# Patient Record
Sex: Female | Born: 1945 | Race: White | Hispanic: No | Marital: Married | Smoking: Former smoker
Health system: Southern US, Community
[De-identification: ages and names within clinical notes are randomized; demographics above are authoritative.]

## PROBLEM LIST (undated history)

## (undated) DIAGNOSIS — J309 Allergic rhinitis, unspecified: Secondary | ICD-10-CM

## (undated) DIAGNOSIS — G4733 Obstructive sleep apnea (adult) (pediatric): Secondary | ICD-10-CM

## (undated) DIAGNOSIS — Z9989 Dependence on other enabling machines and devices: Secondary | ICD-10-CM

## (undated) DIAGNOSIS — K219 Gastro-esophageal reflux disease without esophagitis: Secondary | ICD-10-CM

## (undated) DIAGNOSIS — E785 Hyperlipidemia, unspecified: Secondary | ICD-10-CM

## (undated) DIAGNOSIS — M1990 Unspecified osteoarthritis, unspecified site: Secondary | ICD-10-CM

## (undated) DIAGNOSIS — E119 Type 2 diabetes mellitus without complications: Secondary | ICD-10-CM

## (undated) DIAGNOSIS — M797 Fibromyalgia: Secondary | ICD-10-CM

## (undated) DIAGNOSIS — I2510 Atherosclerotic heart disease of native coronary artery without angina pectoris: Secondary | ICD-10-CM

## (undated) HISTORY — DX: Fibromyalgia: M79.7

## (undated) HISTORY — PX: VESICOVAGINAL FISTULA CLOSURE W/ TAH: SUR271

## (undated) HISTORY — DX: Atherosclerotic heart disease of native coronary artery without angina pectoris: I25.10

## (undated) HISTORY — DX: Allergic rhinitis, unspecified: J30.9

## (undated) HISTORY — DX: Unspecified osteoarthritis, unspecified site: M19.90

## (undated) HISTORY — DX: Type 2 diabetes mellitus without complications: E11.9

## (undated) HISTORY — DX: Gastro-esophageal reflux disease without esophagitis: K21.9

## (undated) HISTORY — PX: EYE SURGERY: SHX253

## (undated) HISTORY — DX: Obstructive sleep apnea (adult) (pediatric): G47.33

## (undated) HISTORY — DX: Hyperlipidemia, unspecified: E78.5

## (undated) HISTORY — PX: TONSILLECTOMY: SUR1361

## (undated) HISTORY — DX: Dependence on other enabling machines and devices: Z99.89

---

## 1998-10-30 ENCOUNTER — Ambulatory Visit (HOSPITAL_COMMUNITY): Admission: RE | Admit: 1998-10-30 | Discharge: 1998-10-30 | Payer: Self-pay | Admitting: Neurosurgery

## 1999-04-07 ENCOUNTER — Encounter: Payer: Self-pay | Admitting: Internal Medicine

## 1999-04-07 ENCOUNTER — Ambulatory Visit: Admission: RE | Admit: 1999-04-07 | Discharge: 1999-04-07 | Payer: Self-pay | Admitting: Internal Medicine

## 2000-04-23 ENCOUNTER — Encounter: Payer: Self-pay | Admitting: *Deleted

## 2000-04-23 ENCOUNTER — Encounter: Admission: RE | Admit: 2000-04-23 | Discharge: 2000-04-23 | Payer: Self-pay | Admitting: *Deleted

## 2003-08-20 ENCOUNTER — Other Ambulatory Visit: Payer: Self-pay

## 2003-11-01 ENCOUNTER — Other Ambulatory Visit: Payer: Self-pay

## 2005-03-25 ENCOUNTER — Emergency Department: Payer: Self-pay | Admitting: Emergency Medicine

## 2005-03-25 ENCOUNTER — Other Ambulatory Visit: Payer: Self-pay

## 2005-03-30 ENCOUNTER — Ambulatory Visit: Payer: Self-pay | Admitting: Emergency Medicine

## 2005-10-11 ENCOUNTER — Emergency Department: Payer: Self-pay | Admitting: Unknown Physician Specialty

## 2005-10-12 ENCOUNTER — Emergency Department: Payer: Self-pay | Admitting: Emergency Medicine

## 2005-10-14 ENCOUNTER — Inpatient Hospital Stay: Payer: Self-pay | Admitting: Infectious Diseases

## 2005-10-14 ENCOUNTER — Other Ambulatory Visit: Payer: Self-pay

## 2005-11-05 ENCOUNTER — Ambulatory Visit: Payer: Self-pay | Admitting: Infectious Diseases

## 2005-11-09 ENCOUNTER — Ambulatory Visit: Payer: Self-pay | Admitting: Infectious Diseases

## 2005-12-10 ENCOUNTER — Ambulatory Visit: Payer: Self-pay | Admitting: Infectious Diseases

## 2006-02-04 ENCOUNTER — Other Ambulatory Visit: Payer: Self-pay

## 2006-02-04 ENCOUNTER — Inpatient Hospital Stay: Payer: Self-pay | Admitting: Specialist

## 2006-02-05 ENCOUNTER — Other Ambulatory Visit: Payer: Self-pay

## 2006-07-08 ENCOUNTER — Ambulatory Visit: Payer: Self-pay | Admitting: Specialist

## 2006-09-21 ENCOUNTER — Ambulatory Visit: Payer: Self-pay | Admitting: Internal Medicine

## 2006-09-28 ENCOUNTER — Ambulatory Visit (HOSPITAL_BASED_OUTPATIENT_CLINIC_OR_DEPARTMENT_OTHER): Admission: RE | Admit: 2006-09-28 | Discharge: 2006-09-28 | Payer: Self-pay | Admitting: Internal Medicine

## 2006-10-03 ENCOUNTER — Ambulatory Visit: Payer: Self-pay | Admitting: Internal Medicine

## 2006-10-14 ENCOUNTER — Ambulatory Visit: Payer: Self-pay | Admitting: Internal Medicine

## 2006-12-16 ENCOUNTER — Ambulatory Visit: Payer: Self-pay | Admitting: Internal Medicine

## 2006-12-29 ENCOUNTER — Ambulatory Visit: Payer: Self-pay | Admitting: Internal Medicine

## 2006-12-30 ENCOUNTER — Ambulatory Visit: Payer: Self-pay | Admitting: Internal Medicine

## 2007-02-09 ENCOUNTER — Ambulatory Visit: Payer: Self-pay | Admitting: Unknown Physician Specialty

## 2007-02-25 ENCOUNTER — Ambulatory Visit: Payer: Self-pay | Admitting: Internal Medicine

## 2007-02-25 DIAGNOSIS — Z860100 Personal history of colon polyps, unspecified: Secondary | ICD-10-CM | POA: Insufficient documentation

## 2007-02-25 DIAGNOSIS — IMO0001 Reserved for inherently not codable concepts without codable children: Secondary | ICD-10-CM

## 2007-02-25 DIAGNOSIS — K219 Gastro-esophageal reflux disease without esophagitis: Secondary | ICD-10-CM | POA: Insufficient documentation

## 2007-02-25 DIAGNOSIS — M810 Age-related osteoporosis without current pathological fracture: Secondary | ICD-10-CM | POA: Insufficient documentation

## 2007-02-25 DIAGNOSIS — J309 Allergic rhinitis, unspecified: Secondary | ICD-10-CM | POA: Insufficient documentation

## 2007-02-25 DIAGNOSIS — M353 Polymyalgia rheumatica: Secondary | ICD-10-CM | POA: Insufficient documentation

## 2007-02-25 DIAGNOSIS — E119 Type 2 diabetes mellitus without complications: Secondary | ICD-10-CM

## 2007-02-25 DIAGNOSIS — N3000 Acute cystitis without hematuria: Secondary | ICD-10-CM

## 2007-02-25 DIAGNOSIS — J45909 Unspecified asthma, uncomplicated: Secondary | ICD-10-CM | POA: Insufficient documentation

## 2007-02-25 DIAGNOSIS — M1990 Unspecified osteoarthritis, unspecified site: Secondary | ICD-10-CM | POA: Insufficient documentation

## 2007-02-25 DIAGNOSIS — E785 Hyperlipidemia, unspecified: Secondary | ICD-10-CM

## 2007-02-25 LAB — CONVERTED CEMR LAB
Bilirubin Urine: NEGATIVE
Blood in Urine, dipstick: NEGATIVE
Glucose, Urine, Semiquant: NEGATIVE
Ketones, urine, test strip: NEGATIVE
Nitrite: NEGATIVE
Specific Gravity, Urine: >=1.03
Urobilinogen, UA: 0.2
pH: 5

## 2007-03-02 ENCOUNTER — Telehealth (INDEPENDENT_AMBULATORY_CARE_PROVIDER_SITE_OTHER): Payer: Self-pay | Admitting: *Deleted

## 2007-03-14 ENCOUNTER — Telehealth (INDEPENDENT_AMBULATORY_CARE_PROVIDER_SITE_OTHER): Payer: Self-pay | Admitting: *Deleted

## 2007-03-15 ENCOUNTER — Telehealth (INDEPENDENT_AMBULATORY_CARE_PROVIDER_SITE_OTHER): Payer: Self-pay | Admitting: *Deleted

## 2007-03-21 ENCOUNTER — Encounter: Payer: Self-pay | Admitting: Internal Medicine

## 2007-03-23 DIAGNOSIS — G4733 Obstructive sleep apnea (adult) (pediatric): Secondary | ICD-10-CM

## 2007-04-08 ENCOUNTER — Telehealth (INDEPENDENT_AMBULATORY_CARE_PROVIDER_SITE_OTHER): Payer: Self-pay | Admitting: *Deleted

## 2007-04-13 ENCOUNTER — Encounter: Payer: Self-pay | Admitting: Internal Medicine

## 2007-04-15 ENCOUNTER — Encounter: Payer: Self-pay | Admitting: Internal Medicine

## 2007-04-15 ENCOUNTER — Telehealth (INDEPENDENT_AMBULATORY_CARE_PROVIDER_SITE_OTHER): Payer: Self-pay | Admitting: *Deleted

## 2007-04-26 ENCOUNTER — Encounter: Payer: Self-pay | Admitting: Internal Medicine

## 2007-05-09 ENCOUNTER — Ambulatory Visit: Payer: Self-pay | Admitting: Internal Medicine

## 2007-06-02 ENCOUNTER — Ambulatory Visit: Payer: Self-pay | Admitting: Internal Medicine

## 2007-06-02 DIAGNOSIS — I2510 Atherosclerotic heart disease of native coronary artery without angina pectoris: Secondary | ICD-10-CM | POA: Insufficient documentation

## 2007-06-06 LAB — CONVERTED CEMR LAB
ALT: 21 units/L (ref 0–35)
AST: 15 units/L (ref 0–37)
Albumin: 3.7 g/dL (ref 3.5–5.2)
Alkaline Phosphatase: 74 units/L (ref 39–117)
BUN: 14 mg/dL (ref 6–23)
Basophils Absolute: 0.2 10*3/uL — ABNORMAL HIGH (ref 0.0–0.1)
Basophils Relative: 1.6 % — ABNORMAL HIGH (ref 0.0–1.0)
Bilirubin, Direct: 0.1 mg/dL (ref 0.0–0.3)
CO2: 34 meq/L — ABNORMAL HIGH (ref 19–32)
Calcium: 9.2 mg/dL (ref 8.4–10.5)
Chloride: 103 meq/L (ref 96–112)
Cholesterol: 281 mg/dL (ref 0–200)
Creatinine, Ser: 0.6 mg/dL (ref 0.4–1.2)
Direct LDL: 206.2 mg/dL
Eosinophils Absolute: 0.1 10*3/uL (ref 0.0–0.7)
Eosinophils Relative: 1.1 % (ref 0.0–5.0)
GFR calc Af Amer: 132 mL/min
GFR calc non Af Amer: 109 mL/min
Glucose, Bld: 119 mg/dL — ABNORMAL HIGH (ref 70–99)
HCT: 36.7 % (ref 36.0–46.0)
HDL: 39.2 mg/dL (ref >39.0)
Hemoglobin: 12.1 g/dL (ref 12.0–15.0)
Hgb A1c MFr Bld: 7 % — ABNORMAL HIGH (ref 4.6–6.0)
Lymphocytes Relative: 13.2 % (ref 12.0–46.0)
MCHC: 32.9 g/dL (ref 30.0–36.0)
MCV: 87.9 fL (ref 78.0–100.0)
Monocytes Absolute: 0.3 10*3/uL (ref 0.1–1.0)
Monocytes Relative: 2.8 % — ABNORMAL LOW (ref 3.0–12.0)
Neutro Abs: 9.9 10*3/uL — ABNORMAL HIGH (ref 1.4–7.7)
Neutrophils Relative %: 81.3 % — ABNORMAL HIGH (ref 43.0–77.0)
Phosphorus: 3.1 mg/dL (ref 2.3–4.6)
Platelets: 333 10*3/uL (ref 150–400)
Potassium: 4.1 meq/L (ref 3.5–5.1)
RBC: 4.17 M/uL (ref 3.87–5.11)
RDW: 14.4 % (ref 11.5–14.6)
Sodium: 142 meq/L (ref 135–145)
TSH: 0.81 microintl units/mL (ref 0.35–5.50)
Total Bilirubin: 0.6 mg/dL (ref 0.3–1.2)
Total CHOL/HDL Ratio: 7.2
Total Protein: 7.1 g/dL (ref 6.0–8.3)
Triglycerides: 261 mg/dL (ref 0–149)
VLDL: 52 mg/dL — ABNORMAL HIGH (ref 0–40)
WBC: 12.1 10*3/uL — ABNORMAL HIGH (ref 4.5–10.5)

## 2007-07-07 ENCOUNTER — Encounter: Payer: Self-pay | Admitting: Internal Medicine

## 2007-07-07 ENCOUNTER — Telehealth: Payer: Self-pay | Admitting: Internal Medicine

## 2007-08-12 ENCOUNTER — Emergency Department: Payer: Medicare Other | Admitting: Emergency Medicine

## 2007-08-17 ENCOUNTER — Telehealth: Payer: Self-pay | Admitting: Internal Medicine

## 2007-08-22 ENCOUNTER — Telehealth (INDEPENDENT_AMBULATORY_CARE_PROVIDER_SITE_OTHER): Payer: Self-pay | Admitting: *Deleted

## 2007-09-21 ENCOUNTER — Encounter: Payer: Self-pay | Admitting: Internal Medicine

## 2007-09-22 ENCOUNTER — Encounter: Payer: Self-pay | Admitting: Internal Medicine

## 2007-09-26 ENCOUNTER — Telehealth (INDEPENDENT_AMBULATORY_CARE_PROVIDER_SITE_OTHER): Payer: Self-pay | Admitting: *Deleted

## 2007-09-28 ENCOUNTER — Telehealth (INDEPENDENT_AMBULATORY_CARE_PROVIDER_SITE_OTHER): Payer: Self-pay | Admitting: *Deleted

## 2007-10-07 ENCOUNTER — Ambulatory Visit: Payer: Self-pay | Admitting: Internal Medicine

## 2007-10-07 DIAGNOSIS — L538 Other specified erythematous conditions: Secondary | ICD-10-CM | POA: Insufficient documentation

## 2007-10-07 DIAGNOSIS — N760 Acute vaginitis: Secondary | ICD-10-CM | POA: Insufficient documentation

## 2007-10-07 DIAGNOSIS — R609 Edema, unspecified: Secondary | ICD-10-CM

## 2007-10-21 ENCOUNTER — Ambulatory Visit: Payer: Self-pay | Admitting: Internal Medicine

## 2007-10-24 ENCOUNTER — Encounter: Payer: Self-pay | Admitting: Internal Medicine

## 2007-10-24 LAB — CONVERTED CEMR LAB
ALT: 28 units/L (ref 0–35)
AST: 25 units/L (ref 0–37)
Albumin: 3.9 g/dL (ref 3.5–5.2)
Alkaline Phosphatase: 69 units/L (ref 39–117)
BUN: 15 mg/dL (ref 6–23)
Basophils Absolute: 0 10*3/uL (ref 0.0–0.1)
Basophils Relative: 0.3 % (ref 0.0–3.0)
Bilirubin, Direct: 0.1 mg/dL (ref 0.0–0.3)
CO2: 33 meq/L — ABNORMAL HIGH (ref 19–32)
Calcium: 8.9 mg/dL (ref 8.4–10.5)
Chloride: 97 meq/L (ref 96–112)
Creatinine, Ser: 0.7 mg/dL (ref 0.4–1.2)
Eosinophils Absolute: 0.1 10*3/uL (ref 0.0–0.7)
Eosinophils Relative: 1 % (ref 0.0–5.0)
GFR calc Af Amer: 110 mL/min
GFR calc non Af Amer: 91 mL/min
Glucose, Bld: 106 mg/dL — ABNORMAL HIGH (ref 70–99)
HCT: 34.8 % — ABNORMAL LOW (ref 36.0–46.0)
Hemoglobin: 11.8 g/dL — ABNORMAL LOW (ref 12.0–15.0)
Hgb A1c MFr Bld: 6.5 % — ABNORMAL HIGH (ref 4.6–6.0)
Lymphocytes Relative: 10.4 % — ABNORMAL LOW (ref 12.0–46.0)
MCHC: 33.7 g/dL (ref 30.0–36.0)
MCV: 90.7 fL (ref 78.0–100.0)
Monocytes Absolute: 0.4 10*3/uL (ref 0.1–1.0)
Monocytes Relative: 3.6 % (ref 3.0–12.0)
Neutro Abs: 9.6 10*3/uL — ABNORMAL HIGH (ref 1.4–7.7)
Neutrophils Relative %: 84.7 % — ABNORMAL HIGH (ref 43.0–77.0)
Phosphorus: 3.5 mg/dL (ref 2.3–4.6)
Platelets: 308 10*3/uL (ref 150–400)
Potassium: 4.1 meq/L (ref 3.5–5.1)
RBC: 3.84 M/uL — ABNORMAL LOW (ref 3.87–5.11)
RDW: 14.1 % (ref 11.5–14.6)
Sodium: 139 meq/L (ref 135–145)
TSH: 0.95 microintl units/mL (ref 0.35–5.50)
Total Bilirubin: 0.6 mg/dL (ref 0.3–1.2)
Total Protein: 7.2 g/dL (ref 6.0–8.3)
WBC: 11.3 10*3/uL — ABNORMAL HIGH (ref 4.5–10.5)

## 2007-11-01 ENCOUNTER — Telehealth (INDEPENDENT_AMBULATORY_CARE_PROVIDER_SITE_OTHER): Payer: Self-pay | Admitting: *Deleted

## 2007-11-08 ENCOUNTER — Telehealth: Payer: Self-pay | Admitting: Internal Medicine

## 2007-11-30 ENCOUNTER — Telehealth: Payer: Self-pay | Admitting: Internal Medicine

## 2007-12-28 ENCOUNTER — Telehealth: Payer: Self-pay | Admitting: Internal Medicine

## 2008-02-27 ENCOUNTER — Telehealth (INDEPENDENT_AMBULATORY_CARE_PROVIDER_SITE_OTHER): Payer: Self-pay | Admitting: *Deleted

## 2008-03-13 ENCOUNTER — Telehealth: Payer: Self-pay | Admitting: Internal Medicine

## 2008-03-22 ENCOUNTER — Encounter: Payer: Self-pay | Admitting: Internal Medicine

## 2008-03-23 ENCOUNTER — Encounter: Payer: Self-pay | Admitting: Internal Medicine

## 2008-04-05 ENCOUNTER — Ambulatory Visit: Payer: Self-pay | Admitting: Internal Medicine

## 2008-05-07 ENCOUNTER — Ambulatory Visit: Payer: Self-pay | Admitting: Internal Medicine

## 2008-05-14 ENCOUNTER — Encounter: Payer: Self-pay | Admitting: Internal Medicine

## 2008-05-22 ENCOUNTER — Ambulatory Visit: Payer: Self-pay | Admitting: Internal Medicine

## 2008-06-25 ENCOUNTER — Telehealth: Payer: Self-pay | Admitting: Internal Medicine

## 2008-06-28 ENCOUNTER — Telehealth: Payer: Self-pay | Admitting: Internal Medicine

## 2008-07-11 ENCOUNTER — Encounter: Payer: Self-pay | Admitting: Internal Medicine

## 2008-07-13 ENCOUNTER — Encounter: Payer: Self-pay | Admitting: Internal Medicine

## 2008-07-18 ENCOUNTER — Telehealth (INDEPENDENT_AMBULATORY_CARE_PROVIDER_SITE_OTHER): Payer: Self-pay | Admitting: *Deleted

## 2008-07-20 ENCOUNTER — Telehealth: Payer: Self-pay | Admitting: Internal Medicine

## 2008-08-01 ENCOUNTER — Telehealth (INDEPENDENT_AMBULATORY_CARE_PROVIDER_SITE_OTHER): Payer: Self-pay | Admitting: *Deleted

## 2008-08-07 ENCOUNTER — Ambulatory Visit: Payer: Self-pay | Admitting: Internal Medicine

## 2008-08-07 DIAGNOSIS — E678 Other specified hyperalimentation: Secondary | ICD-10-CM | POA: Insufficient documentation

## 2008-08-13 ENCOUNTER — Encounter: Payer: Self-pay | Admitting: Internal Medicine

## 2008-08-16 ENCOUNTER — Encounter: Payer: Self-pay | Admitting: Internal Medicine

## 2008-08-17 ENCOUNTER — Telehealth: Payer: Self-pay | Admitting: Family Medicine

## 2008-08-20 ENCOUNTER — Ambulatory Visit: Payer: Self-pay | Admitting: Internal Medicine

## 2008-08-20 DIAGNOSIS — R079 Chest pain, unspecified: Secondary | ICD-10-CM | POA: Insufficient documentation

## 2008-08-24 LAB — CONVERTED CEMR LAB
ALT: 28 units/L (ref 0–35)
Alkaline Phosphatase: 78 units/L (ref 39–117)
Basophils Relative: 0.7 % (ref 0.0–3.0)
Bilirubin, Direct: 0.1 mg/dL (ref 0.0–0.3)
Calcium: 9.5 mg/dL (ref 8.4–10.5)
Chloride: 99 meq/L (ref 96–112)
Eosinophils Absolute: 0 10*3/uL (ref 0.0–0.7)
Glucose, Bld: 215 mg/dL — ABNORMAL HIGH (ref 70–99)
HCT: 38.1 % (ref 36.0–46.0)
Hemoglobin: 12.8 g/dL (ref 12.0–15.0)
Lymphocytes Relative: 5.8 % — ABNORMAL LOW (ref 12.0–46.0)
Lymphs Abs: 0.7 10*3/uL (ref 0.7–4.0)
MCHC: 33.6 g/dL (ref 30.0–36.0)
Monocytes Relative: 4.5 % (ref 3.0–12.0)
Neutro Abs: 11 10*3/uL — ABNORMAL HIGH (ref 1.4–7.7)
Potassium: 4.5 meq/L (ref 3.5–5.1)
RBC: 3.97 M/uL (ref 3.87–5.11)
Sed Rate: 32 mm/hr — ABNORMAL HIGH (ref 0–22)
Sodium: 138 meq/L (ref 135–145)
Total Protein: 6.8 g/dL (ref 6.0–8.3)

## 2008-08-30 ENCOUNTER — Encounter: Payer: Self-pay | Admitting: Internal Medicine

## 2008-09-01 ENCOUNTER — Encounter: Payer: Self-pay | Admitting: Internal Medicine

## 2008-09-01 ENCOUNTER — Emergency Department: Payer: Medicare Other | Admitting: Emergency Medicine

## 2008-09-02 ENCOUNTER — Telehealth: Payer: Self-pay | Admitting: Internal Medicine

## 2008-09-04 ENCOUNTER — Telehealth: Payer: Self-pay | Admitting: Internal Medicine

## 2008-09-04 ENCOUNTER — Encounter: Payer: Self-pay | Admitting: Internal Medicine

## 2008-09-05 ENCOUNTER — Telehealth: Payer: Self-pay | Admitting: Internal Medicine

## 2008-09-06 ENCOUNTER — Encounter: Payer: Self-pay | Admitting: Internal Medicine

## 2008-09-10 ENCOUNTER — Ambulatory Visit: Payer: Medicare Other | Admitting: Unknown Physician Specialty

## 2008-09-10 ENCOUNTER — Encounter: Payer: Self-pay | Admitting: Internal Medicine

## 2008-09-11 ENCOUNTER — Ambulatory Visit: Payer: Medicare Other | Admitting: Unknown Physician Specialty

## 2008-09-20 ENCOUNTER — Encounter: Payer: Self-pay | Admitting: Internal Medicine

## 2008-09-21 ENCOUNTER — Ambulatory Visit: Payer: Medicare Other | Admitting: Unknown Physician Specialty

## 2008-09-24 ENCOUNTER — Telehealth: Payer: Self-pay | Admitting: Internal Medicine

## 2008-10-24 ENCOUNTER — Telehealth (INDEPENDENT_AMBULATORY_CARE_PROVIDER_SITE_OTHER): Payer: Self-pay | Admitting: *Deleted

## 2008-10-27 ENCOUNTER — Encounter: Payer: Self-pay | Admitting: Internal Medicine

## 2008-10-28 ENCOUNTER — Inpatient Hospital Stay: Payer: Medicare Other | Admitting: Internal Medicine

## 2008-11-01 ENCOUNTER — Encounter: Payer: Self-pay | Admitting: Internal Medicine

## 2008-11-08 ENCOUNTER — Encounter: Payer: Medicare Other | Admitting: Internal Medicine

## 2008-11-08 ENCOUNTER — Encounter: Payer: Self-pay | Admitting: Internal Medicine

## 2008-11-09 ENCOUNTER — Encounter: Payer: Medicare Other | Admitting: Internal Medicine

## 2008-11-12 ENCOUNTER — Telehealth: Payer: Self-pay | Admitting: Internal Medicine

## 2008-11-14 ENCOUNTER — Telehealth: Payer: Self-pay | Admitting: Internal Medicine

## 2008-12-03 ENCOUNTER — Telehealth: Payer: Self-pay | Admitting: Internal Medicine

## 2008-12-10 ENCOUNTER — Ambulatory Visit: Payer: Self-pay | Admitting: Internal Medicine

## 2008-12-10 ENCOUNTER — Encounter: Payer: Medicare Other | Admitting: Internal Medicine

## 2008-12-11 ENCOUNTER — Ambulatory Visit: Payer: Self-pay | Admitting: Internal Medicine

## 2008-12-14 LAB — CONVERTED CEMR LAB
AST: 21 units/L (ref 0–37)
Albumin: 4.1 g/dL (ref 3.5–5.2)
BUN: 12 mg/dL (ref 6–23)
Basophils Absolute: 0 10*3/uL (ref 0.0–0.1)
CO2: 34 meq/L — ABNORMAL HIGH (ref 19–32)
Chloride: 94 meq/L — ABNORMAL LOW (ref 96–112)
Cholesterol: 272 mg/dL — ABNORMAL HIGH (ref 0–200)
Eosinophils Absolute: 0.1 10*3/uL (ref 0.0–0.7)
HCT: 38.2 % (ref 36.0–46.0)
Hemoglobin: 13.2 g/dL (ref 12.0–15.0)
Lymphs Abs: 1.6 10*3/uL (ref 0.7–4.0)
MCHC: 34.5 g/dL (ref 30.0–36.0)
MCV: 94.3 fL (ref 78.0–100.0)
Monocytes Absolute: 0.6 10*3/uL (ref 0.1–1.0)
Monocytes Relative: 5.4 % (ref 3.0–12.0)
Neutro Abs: 8.9 10*3/uL — ABNORMAL HIGH (ref 1.4–7.7)
RDW: 13.3 % (ref 11.5–14.6)
Total CHOL/HDL Ratio: 6
Triglycerides: 323 mg/dL — ABNORMAL HIGH (ref 0.0–149.0)

## 2008-12-20 ENCOUNTER — Encounter: Payer: Self-pay | Admitting: Internal Medicine

## 2008-12-21 ENCOUNTER — Encounter: Payer: Self-pay | Admitting: Internal Medicine

## 2008-12-21 ENCOUNTER — Telehealth: Payer: Self-pay | Admitting: Internal Medicine

## 2008-12-25 ENCOUNTER — Encounter: Payer: Self-pay | Admitting: Internal Medicine

## 2009-01-01 ENCOUNTER — Telehealth: Payer: Self-pay | Admitting: Family Medicine

## 2009-01-07 ENCOUNTER — Telehealth: Payer: Self-pay | Admitting: Internal Medicine

## 2009-01-08 ENCOUNTER — Encounter: Payer: Self-pay | Admitting: Internal Medicine

## 2009-01-09 ENCOUNTER — Encounter: Payer: Medicare Other | Admitting: Internal Medicine

## 2009-01-16 ENCOUNTER — Telehealth: Payer: Self-pay | Admitting: Internal Medicine

## 2009-01-30 ENCOUNTER — Encounter: Payer: Self-pay | Admitting: Internal Medicine

## 2009-02-09 ENCOUNTER — Encounter: Payer: Medicare Other | Admitting: Internal Medicine

## 2009-02-21 ENCOUNTER — Telehealth: Payer: Self-pay | Admitting: Internal Medicine

## 2009-02-22 ENCOUNTER — Encounter: Payer: Self-pay | Admitting: Internal Medicine

## 2009-02-27 ENCOUNTER — Encounter: Payer: Self-pay | Admitting: Internal Medicine

## 2009-03-12 ENCOUNTER — Encounter: Payer: Medicare Other | Admitting: Internal Medicine

## 2009-04-03 ENCOUNTER — Encounter: Payer: Self-pay | Admitting: Internal Medicine

## 2009-04-09 ENCOUNTER — Encounter: Payer: Medicare Other | Admitting: Internal Medicine

## 2009-04-10 ENCOUNTER — Encounter: Payer: Self-pay | Admitting: Internal Medicine

## 2009-04-19 ENCOUNTER — Encounter: Payer: Self-pay | Admitting: Internal Medicine

## 2009-05-10 ENCOUNTER — Encounter: Payer: Medicare Other | Admitting: Internal Medicine

## 2009-06-09 ENCOUNTER — Encounter: Payer: Medicare Other | Admitting: Internal Medicine

## 2009-06-24 ENCOUNTER — Ambulatory Visit: Payer: Medicare Other | Admitting: Internal Medicine

## 2009-07-10 ENCOUNTER — Encounter: Payer: Medicare Other | Admitting: Internal Medicine

## 2009-07-15 ENCOUNTER — Ambulatory Visit: Payer: Medicare Other | Admitting: Internal Medicine

## 2009-08-09 ENCOUNTER — Ambulatory Visit: Payer: Medicare Other | Admitting: Internal Medicine

## 2009-08-09 ENCOUNTER — Encounter: Payer: Medicare Other | Admitting: Internal Medicine

## 2009-08-27 ENCOUNTER — Encounter: Payer: Self-pay | Admitting: Internal Medicine

## 2009-09-09 ENCOUNTER — Encounter: Payer: Medicare Other | Admitting: Internal Medicine

## 2009-09-19 ENCOUNTER — Ambulatory Visit: Payer: Self-pay | Admitting: Internal Medicine

## 2009-09-19 DIAGNOSIS — J42 Unspecified chronic bronchitis: Secondary | ICD-10-CM | POA: Insufficient documentation

## 2009-10-10 ENCOUNTER — Encounter: Payer: Medicare Other | Admitting: Internal Medicine

## 2009-10-10 ENCOUNTER — Ambulatory Visit: Payer: Medicare Other | Admitting: Internal Medicine

## 2009-10-28 ENCOUNTER — Ambulatory Visit: Payer: Medicare Other | Admitting: Internal Medicine

## 2009-11-09 ENCOUNTER — Ambulatory Visit: Payer: Medicare Other | Admitting: Internal Medicine

## 2009-11-09 ENCOUNTER — Encounter: Payer: Medicare Other | Admitting: Internal Medicine

## 2009-11-11 ENCOUNTER — Telehealth (INDEPENDENT_AMBULATORY_CARE_PROVIDER_SITE_OTHER): Payer: Self-pay | Admitting: *Deleted

## 2009-11-13 ENCOUNTER — Telehealth (INDEPENDENT_AMBULATORY_CARE_PROVIDER_SITE_OTHER): Payer: Self-pay | Admitting: *Deleted

## 2009-11-20 ENCOUNTER — Encounter: Payer: Self-pay | Admitting: Internal Medicine

## 2009-12-10 ENCOUNTER — Encounter: Payer: Medicare Other | Admitting: Internal Medicine

## 2010-01-09 ENCOUNTER — Encounter: Payer: Medicare Other | Admitting: Internal Medicine

## 2010-01-29 ENCOUNTER — Telehealth: Payer: Self-pay | Admitting: Internal Medicine

## 2010-01-30 ENCOUNTER — Ambulatory Visit: Payer: Self-pay | Admitting: Internal Medicine

## 2010-01-30 ENCOUNTER — Encounter: Payer: Self-pay | Admitting: Internal Medicine

## 2010-02-05 ENCOUNTER — Telehealth (INDEPENDENT_AMBULATORY_CARE_PROVIDER_SITE_OTHER): Payer: Self-pay | Admitting: *Deleted

## 2010-02-05 LAB — CONVERTED CEMR LAB
Basophils Absolute: 0 10*3/uL (ref 0.0–0.1)
Basophils Relative: 0.3 % (ref 0.0–3.0)
Eosinophils Absolute: 0 10*3/uL (ref 0.0–0.7)
Lymphocytes Relative: 8.3 % — ABNORMAL LOW (ref 12.0–46.0)
MCHC: 33.2 g/dL (ref 30.0–36.0)
MCV: 92.9 fL (ref 78.0–100.0)
Monocytes Absolute: 0.4 10*3/uL (ref 0.1–1.0)
Neutrophils Relative %: 88.3 % — ABNORMAL HIGH (ref 43.0–77.0)
Platelets: 335 10*3/uL (ref 150.0–400.0)
RDW: 14 % (ref 11.5–14.6)

## 2010-02-09 ENCOUNTER — Encounter: Payer: Medicare Other | Admitting: Internal Medicine

## 2010-02-11 ENCOUNTER — Telehealth (INDEPENDENT_AMBULATORY_CARE_PROVIDER_SITE_OTHER): Payer: Self-pay | Admitting: *Deleted

## 2010-02-12 ENCOUNTER — Ambulatory Visit: Admit: 2010-02-12 | Payer: Self-pay | Admitting: Internal Medicine

## 2010-02-13 ENCOUNTER — Telehealth: Payer: Self-pay | Admitting: Internal Medicine

## 2010-02-17 ENCOUNTER — Telehealth (INDEPENDENT_AMBULATORY_CARE_PROVIDER_SITE_OTHER): Payer: Self-pay | Admitting: *Deleted

## 2010-02-28 ENCOUNTER — Ambulatory Visit
Admission: RE | Admit: 2010-02-28 | Discharge: 2010-02-28 | Payer: Self-pay | Source: Home / Self Care | Attending: Internal Medicine | Admitting: Internal Medicine

## 2010-02-28 ENCOUNTER — Other Ambulatory Visit: Payer: Self-pay | Admitting: Internal Medicine

## 2010-02-28 DIAGNOSIS — R29818 Other symptoms and signs involving the nervous system: Secondary | ICD-10-CM | POA: Insufficient documentation

## 2010-02-28 LAB — CBC WITH DIFFERENTIAL/PLATELET
Basophils Absolute: 0 10*3/uL (ref 0.0–0.1)
Basophils Relative: 0.2 % (ref 0.0–3.0)
Eosinophils Absolute: 0 10*3/uL (ref 0.0–0.7)
Eosinophils Relative: 0.3 % (ref 0.0–5.0)
HCT: 38.4 % (ref 36.0–46.0)
Hemoglobin: 13 g/dL (ref 12.0–15.0)
Lymphocytes Relative: 7.1 % — ABNORMAL LOW (ref 12.0–46.0)
Lymphs Abs: 1 10*3/uL (ref 0.7–4.0)
MCHC: 33.8 g/dL (ref 30.0–36.0)
MCV: 93.6 fl (ref 78.0–100.0)
Monocytes Absolute: 0.3 10*3/uL (ref 0.1–1.0)
Monocytes Relative: 2.1 % — ABNORMAL LOW (ref 3.0–12.0)
Neutro Abs: 12.6 10*3/uL — ABNORMAL HIGH (ref 1.4–7.7)
Neutrophils Relative %: 90.3 % — ABNORMAL HIGH (ref 43.0–77.0)
Platelets: 276 10*3/uL (ref 150.0–400.0)
RBC: 4.11 Mil/uL (ref 3.87–5.11)
RDW: 15.1 % — ABNORMAL HIGH (ref 11.5–14.6)
WBC: 14 10*3/uL — ABNORMAL HIGH (ref 4.5–10.5)

## 2010-02-28 LAB — BASIC METABOLIC PANEL
BUN: 15 mg/dL (ref 6–23)
CO2: 35 mEq/L — ABNORMAL HIGH (ref 19–32)
Calcium: 9.3 mg/dL (ref 8.4–10.5)
Chloride: 100 mEq/L (ref 96–112)
Creatinine, Ser: 0.7 mg/dL (ref 0.4–1.2)
GFR: 94.7 mL/min (ref >60.00)
Glucose, Bld: 137 mg/dL — ABNORMAL HIGH (ref 70–99)
Potassium: 4.5 mEq/L (ref 3.5–5.1)
Sodium: 142 mEq/L (ref 135–145)

## 2010-03-03 ENCOUNTER — Encounter: Payer: Self-pay | Admitting: Interventional Radiology

## 2010-03-04 ENCOUNTER — Ambulatory Visit: Payer: Self-pay | Admitting: Internal Medicine

## 2010-03-11 NOTE — Letter (Signed)
Summary: Doreen Salvage, MD  Doreen Salvage, MD   Imported By: Lanelle Bal 02/12/2009 12:06:54  _____________________________________________________________________  External Attachment:    Type:   Image     Comment:   External Document  Appended Document: Doreen Salvage, MD getting IV boniva

## 2010-03-11 NOTE — Letter (Signed)
Summary: CMN for CPAP/[facility]  CMN for CPAP/[facility]   Imported By: Lanelle Bal 04/24/2009 13:53:09  _____________________________________________________________________  External Attachment:    Type:   Image     Comment:   External Document

## 2010-03-11 NOTE — Letter (Signed)
Summary: CMN for CPAP/[facility]  CMN for CPAP/[facility]   Imported By: Lanelle Bal 04/05/2009 10:11:00  _____________________________________________________________________  External Attachment:    Type:   Image     Comment:   External Document

## 2010-03-11 NOTE — Letter (Signed)
Summary: CMN for Diabetes Supplies/[facility]  CMN for Diabetes Supplies/[facility]   Imported By: Lanelle Bal 02/28/2009 08:40:03  _____________________________________________________________________  External Attachment:    Type:   Image     Comment:   External Document

## 2010-03-11 NOTE — Letter (Signed)
Summary: CMN for CPAP/[facility]  CMN for CPAP/[facility]   Imported By: Lanelle Bal 04/11/2009 11:45:08  _____________________________________________________________________  External Attachment:    Type:   Image     Comment:   External Document

## 2010-03-11 NOTE — Letter (Signed)
Summary: CMN for Diabetes Supplies/[facility]  CMN for Diabetes Supplies/[facility]   Imported By: Sherian Rein 03/01/2009 13:55:03  _____________________________________________________________________  External Attachment:    Type:   Image     Comment:   External Document

## 2010-03-11 NOTE — Medication Information (Signed)
Summary: Nebulizer Meds [facility]  Nebulizer Meds [facility]   Imported By: Lennie Odor 12/02/2009 12:25:40  _____________________________________________________________________  External Attachment:    Type:   Image     Comment:   External Document

## 2010-03-11 NOTE — Progress Notes (Signed)
Summary: Diabetes supplies  Phone Note From Pharmacy Call back at [phone]   Caller: [facility] Call For: Dr. Alphonsus Sias  Summary of Call: Received faxed form to be completed and faxed back with correct diagnosis for glucometer and supplies.  Please advise, form in your IN box Initial call taken by: Linde Gillis CMA Duncan Dull),  February 21, 2009 4:43 PM  Follow-up for Phone Call        form done to approve testing 4 times daily Follow-up by: Cindee Salt MD,  February 22, 2009 7:57 AM

## 2010-03-11 NOTE — Progress Notes (Signed)
Summary: Albuterol rx  Phone Note Call from Patient   Caller: Patient Call For: young Summary of Call: pt need order sent to [facility] for albuterol prescript for nebulizer. Initial call taken by: Rickard Patience,  November 11, 2009 4:33 PM  Follow-up for Phone Call        Albuterol rx sent to [facility] - pt aware.   Gweneth Dimitri RN  November 11, 2009 5:13 PM     Prescriptions: ALBUTEROL SULFATE (2.5 MG/3ML) 0.083%  NEBU (ALBUTEROL SULFATE) as needed  #30 x 0   Entered by:   Gweneth Dimitri RN   Authorized by:   Waymon Budge MD   Signed by:   Gweneth Dimitri RN on 11/11/2009   Method used:   Faxed to ...       [facility]       [address]       [place], [place]  [address]       Ph: [phone]       Fax: [phone]   RxID:   [id number]

## 2010-03-11 NOTE — Progress Notes (Signed)
Summary: question about albuterol directions  Phone Note From Pharmacy Call back at [phone]   Caller: [facility] Call For: young  Summary of Call: pharmacy have questions about dosage for albuterol. Initial call taken by: Rickard Patience,  November 13, 2009 4:38 PM  Follow-up for Phone Call        [facility].  Office has closed for the day.  Will call back tomorrow. Aundra Millet Reynolds LPN  November 13, 2009 5:08 PM   called and spoke with Pharmacist at [facility].  She is needing specific instructions to pt's albuterol nebs.  States this is needed per Medicare.  Per EMR, directions state...... as needed.  Will forward message to CY to address directions of Albuterol Nebs.  Aundra Millet Reynolds LPN  November 14, 2009 10:12 AM   Additional Follow-up for Phone Call Additional follow up Details #1::        Please call patient and ask her if she is only using Albuterol  for her nebulizer tx' as three times a day or if she is using Brovana. Please let me know. Thanks.Reynaldo Minium CMA  November 14, 2009 10:21 AM     Additional Follow-up for Phone Call Additional follow up Details #2::    called and spoke with pt.  pt states she does not use Brovana nebs.  This was d/c'd.  Pt states she only uses albuterol nebs as needed.  Pt states some days "she will have to use nebs two times a day and then she may go a week without having to use any nebs."  Pt believes 1 box (# 25 vials) will be "plenty" to last her a month.  Will forward message back to Cy to get  the sig for Albut Nebs.  Aundra Millet Reynolds LPN  November 14, 2009 11:04 AM     Please let [facility] know that CDY is okay giving Albuterol RX 1 vial four times a day as needed Reynaldo Minium CMA  November 14, 2009 11:32 AM   Additional Follow-up for Phone Call Additional follow up Details #3:: Details for Additional Follow-up Action Taken: called and spoke with pharmacist, Nadine Counts, from [facility] and informed him of CY's response.  nothing further needed.  Megan  Reynolds LPN  November 14, 2009 11:40 AM   New/Updated Medications: ALBUTEROL SULFATE (2.5 MG/3ML) 0.083% NEBU (ALBUTEROL SULFATE) 1 vial in nebulizer four times a day as needed Prescriptions: ALBUTEROL SULFATE (2.5 MG/3ML) 0.083% NEBU (ALBUTEROL SULFATE) 1 vial in nebulizer four times a day as needed  #120 x 3   Entered by:   Arman Filter LPN   Authorized by:   Waymon Budge MD   Signed by:   Arman Filter LPN on 41/66/0630   Method used:   Telephoned to ...       [facility])       [address]       [place], [place]  [address]       Ph: [phone]       Fax: [phone]   RxID:   [id number]

## 2010-03-11 NOTE — Assessment & Plan Note (Signed)
Summary: rov/apc   Primary Provider/Referring Provider:  Clayborn Bigness, MD/ [place]  CC:  Follow up visit-asthma and sleep.Marland Kitchen  History of Present Illness:  December 11, 2008- Asthma, allergic rhinitis, OSA, DM, obesity.................Marland Kitchenhusband here Had endoscopy without respiratory problems during sedation. Also hosp [facility] for compression fx mid back. Acutely that made her dyspneic. Notes desaturation drops when sitting quietly. She is concerned about desat during day. She has significant dry eye- treated by her eye doc. Takes prednisone 10-40 mg daily, unable to wean off because of PMR and fibromyalgia pains. Wheezes on awakening and has coughed a little brown mucus. Throat burns at night. aware of reflux, but takes mylanta. Does have  Barrett's and tried sucralfate.  Had flu vax.  September 19, 2009- Asthma, allergic rhinitis, OSA, DM, obesity........................Marland Kitchenhusband here Has been using either albuterol or Brovana by neb occasionally  as needed. She declined delivery of a Brovana shipment that she felt got too hot in transport. She doesn't get outdoors much. She seems to kick her legs in sleep and we discussed Restless Legs.  Produces more mucus if she eats hot with cold foods.   Reviewed PFT from 05/2008- mild restriction and obstruction with resonse to dilator. Obesity/ hypoventilation and some deree of reversible obstruction- asthmatic bronchits.  Wheezes more in the morning.    Asthma History    Initial Asthma Severity Rating:    Age range: 12+ years    Symptoms: 0-2 days/week    Nighttime Awakenings: 0-2/month    Interferes w/ normal activity: some limitations    SABA use (not for EIB): several times per day    Asthma Severity Assessment: Severe Persistent   Preventive Screening-Counseling & Management  Alcohol-Tobacco     Smoking Status: quit     Year Quit: 1991  Current Medications (verified): 1)  Albuterol Sulfate (2.5 Mg/23ml) 0.083%  Nebu (Albuterol Sulfate)  .... As Needed 2)  O2 3l For Sleep With Cpap .... 2l Portable,  Advanced 3)  Cpap 11 Cwp With 3 L O2 Advanced 4)  Prednisone 10 Mg  Tabs (Prednisone) .Marland Kitchen.. 1 Daily Sometimes More Up To 4 Tablets 5)  Humalog 100 Unit/ml  Soln (Insulin Lispro (Human)) .... Sliding Scale---  20-25 Three Times A Day Before Meals 6)  Lantus Solostar 100 Unit/ml  Soln (Insulin Glargine) .... 40 Units Nightly 7)  Oxycodone-Acetaminophen 10-650 Mg  Tabs (Oxycodone-Acetaminophen) .Marland Kitchen.. 1 Tablet Q 4 Hours As Needed 8)  Nizatidine 150 Mg  Caps (Nizatidine) .... 2 Pills  Two Times A Day 9)  Diazepam 10 Mg  Tabs (Diazepam) .... Take 1 By Mouth Two Times A Day As Needed 10)  Protonix 40 Mg  Tbec (Pantoprazole Sodium) .... 2 Tablets Two Times A Day Tiwice A Day 11)  Furosemide 80 Mg Tabs (Furosemide) .Marland Kitchen.. 1 Daily As Needed 12)  Bd Insulin Syringe 29g X 1/2" 1 Ml Misc (Insulin Syringe-Needle U-100) .... Use As Directed 13)  Ketoconazole 2 % Crea (Ketoconazole) .... Apply Three Times A Day To Rash Till Clear 14)  Fluconazole 100 Mg Tabs (Fluconazole) .Marland Kitchen.. 1 Tab Weekly To Prevent Yeast Infections 15)  Duke's Magic Mouthwash .... Take 5 Cc By Mouth Swish and Spit Up To 4 Times Daily For Thrush 16)  Promethazine Hcl 25 Mg Tabs (Promethazine Hcl) .... Take 1 By Mouth Three Times A Day As Needed Nausea 17)  Duragesic-100 100 Mcg/hr Pt72 (Fentanyl) .... Use Every Three Days 18)  Proair Hfa 108 (90 Base) Mcg/act Aers (Albuterol Sulfate) .... 2 Puffs  Four Times A Day As Needed Rescue 19)  Synthroid 25 Mcg Tabs (Levothyroxine Sodium) .... Take 1 By Mouth Once Daily 20)  Meclizine Hcl 25 Mg Tabs (Meclizine Hcl) .... Take 1 By Mouth Every 6 Hours As Needed 21)  Boniva 3 Mg/57ml Kit (Ibandronate Sodium) .... Injection As Directed 22)  Maalox 600 Mg Chew (Calcium Carbonate Antacid) .... As Needed  Allergies: 1)  ! Sulfa 2)  ! Codeine 3)  ! Morphine 4)  ! Levaquin 5)  ! * Ofloxascin 6)  ! Sodium Benzoate (Sodium Benzoate) 7)  ! 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------------------------------------------Dr Darrold Junker Compression fracture--T6 (?T5 also)---------------- 9/10  Past Surgical History: Last updated: 2007-03-05 Eye surgery age 18 Hysterectomy--in 20's for dysplasia Tonsillectomy Left knee cyst removed  Family History: Last updated: 05-Mar-2007 Dad died @80 . DM complications Mom had multiple problems. 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  The patient denies coughing up blood, chest pain, irregular heartbeats, acid heartburn, indigestion, loss of appetite, weight change, abdominal pain, difficulty swallowing, sore throat,  tooth/dental problems, headaches, nasal congestion/difficulty breathing through nose, and sneezing.    Vital Signs:  Patient profile:   65 year old female Height:      63.5 inches Weight:      276.25 pounds BMI:     48.34 O2 Sat:      90 % on Room air Pulse rate:   82 / minute BP sitting:   100 / 60  (left arm) Cuff size:   large  Vitals Entered By: Reynaldo Minium CMA (September 19, 2009 4:31 PM)  O2 Flow:  Room air CC: Follow up visit-asthma and sleep.   Physical Exam  Additional Exam:  General: A/Ox3; pleasant and cooperative, NAD,  Morbidly obese, Wheelchair,               room air- SAT 90% SKIN: abrasions from rubbing elbows on her bedsheets.  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 NECK: Supple w/ fair ROM, JVD- none, normal carotid impulses w/o bruits Thyroid- CHEST: Clear to P&A, able to speak in long sentences without coughing. Distant shallow breath sounds, without wheeze  HEART: RRR, no m/g/r heard ABDOMEN: Soft and nl;  WUX:LKGM, nl pulses, no edema today. NEURO: Grossly intact to observation      Impression & Recommendations:  Problem # 1:  OBESITY HYPOVENTILATION SYNDROME (ICD-278.8) Por airflow into lung bases likely would  be iproved if she were able to lose weight as explained.  Problem # 2:  SLEEP APNEA, OBSTRUCTIVE (ICD-327.23) She remains compliant with CPAP at 11 with supplemental O2 and benefits from its use.  Problem # 3:  UNSPECIFIED CHRONIC BRONCHITIS (ICD-491.9) She is not actively coughing or wheezing today. We discussed triggers and current med use. she has needed to maintain low-dose prednisone for this level of symptom control. I discssed minimization of steroid use.  Medications Added to Medication List This Visit: 1)  Synthroid 25 Mcg Tabs (Levothyroxine sodium) .... Take 1 by mouth once  daily 2)  Meclizine Hcl 25 Mg Tabs (Meclizine hcl) .... Take 1 by mouth every 6 hours as needed 3)  Boniva 3 Mg/43ml Kit (Ibandronate sodium) .... Injection as directed 4)  Maalox 600 Mg Chew (Calcium carbonate antacid) .... As needed  Other Orders: Est. Patient Level IV (81191)  Patient Instructions: 1)  Please schedule a follow-up appointment in 6 months. 2)  Stop Brovana- it hasn't been enough help to bother with.

## 2010-03-11 NOTE — Medication Information (Signed)
Summary: Brovana/[facility]  Brovana/[facility]   Imported By: Lester Fairmount 08/29/2009 10:56:21  _____________________________________________________________________  External Attachment:    Type:   Image     Comment:   External Document

## 2010-03-12 ENCOUNTER — Encounter: Payer: Medicare Other | Admitting: Internal Medicine

## 2010-03-13 NOTE — Progress Notes (Signed)
Summary: HURTS TO BREATHE  Phone Note Call from Patient Call back at [facility] Phone [phone]   Caller: Patient Call For: YOUNG Summary of Call: PT C/O PAIN IN LEFT SIDE BELOW RIBS AND AROUND THE BACK- ALSO PAIN IN STOMACH AND AROUND HER WAIST. SHE SAYS WHEN SHE LIES DOWN IT IS THE WORST. THIS HAS BEEN GOING ON FOR 10 DAYS. PT HAD A BREATHING TX AT 12:30 (APPROX) AND THIS HELPED HER SOB "A LITTLE". HOWEVER, THE PAIN IS INTENSE. SHE FEELS LIKE MAYBE SHE HAS A COLLAPSED LUNG (ALTHOUGH SHE SAYS SHE HAS NEVER HAD A COLLAPSED LUNG). PT HAS NOT FALLEN OR BEEN IN ANY KIND OF ACCIDENT RECENTLY. SAYS IT JUST "REALLY HURTS TO BREATHE". [facility] [place] Initial call taken by: Tivis Ringer, CNA,  January 29, 2010 2:20 PM  Follow-up for Phone Call        Pt c/o increased SOB and pain in her back and et side under arm area. She states the pain is so sharp it takes her breath away. She states pain is worse when she lies flat.  pt set to see CY tomorow at 11:30 ok per TD. Carron Curie CMA  January 29, 2010 4:14 PM

## 2010-03-13 NOTE — Assessment & Plan Note (Signed)
Summary: sob, chest pain//jrc   Primary Provider/Referring Provider:  Clayborn Bigness, MD/ [place]  CC:  sick visit.  c/o back and chest pain x 10 days, worse when laying down, pain is 8/10 on pain scale, pain is "throbbing and constant."  Pt also c/o low grade temp recently, hoarseness, and and coughing up thick clear to brown sputum. Marland Kitchen  History of Present Illness: December 11, 2008- Asthma, allergic rhinitis, OSA, DM, obesity.................Marland Kitchenhusband here Had endoscopy without respiratory problems during sedation. Also hosp [place] for compression fx mid back. Acutely that made her dyspneic. Notes desaturation drops when sitting quietly. She is concerned about desat during day. She has significant dry eye- treated by her eye doc. Takes prednisone 10-40 mg daily, unable to wean off because of PMR and fibromyalgia pains. Wheezes on awakening and has coughed a little brown mucus. Throat burns at night. aware of reflux, but takes mylanta. Does have  Barrett's and tried sucralfate.  Had flu vax.  September 19, 2009- Asthma, allergic rhinitis, OSA, DM, obesity........................Marland Kitchenhusband here Has been using either albuterol or Brovana by neb occasionally  as needed. She declined delivery of a Brovana shipment that she felt got too hot in transport. She doesn't get outdoors much. She seems to kick her legs in sleep and we discussed Restless Legs.  Produces more mucus if she eats hot with cold foods.   Reviewed PFT from 05/2008- mild restriction and obstruction with resonse to dilator. Obesity/ hypoventilation and some deree of reversible obstruction- asthmatic bronchits.  Wheezes more in the morning.  January 30, 2010-  Asthma, allergic rhinitis, OSA, DM, obesity........................Marland Kitchenhusband here Nurse-CC: sick visit.  c/o back and chest pain x 10 days, worse when laying down, pain is 8/10 on pain scale, pain is "throbbing and constant."  Pt also c/o low grade temp recently, hoarseness, and  coughing up thick clear to brown sputum.  Acute- Onset left flank pain 5 days ago. Foot slipped getting in car 2 days prior. .  Then moved around to midsternal and bilat ribs. Cough productive, white yellow brown., thick. Tussive pain. Has been having heart burn. Still throbbing sore left post lateral where it began. Low grade fever or chills Legs not different    Preventive Screening-Counseling & Management  Alcohol-Tobacco     Smoking Status: quit     Year Quit: 1991  Current Medications (verified): 1)  Albuterol Sulfate (2.5 Mg/22ml) 0.083% Nebu (Albuterol Sulfate) .Marland Kitchen.. 1 Vial in Nebulizer Four Times A Day As Needed 2)  O2 3l For Sleep With Cpap .... 2l Portable,  Advanced 3)  Cpap 11 Cwp With 3 L O2 Advanced 4)  Prednisone 10 Mg  Tabs (Prednisone) .Marland Kitchen.. 1 Daily Sometimes More Up To 4 Tablets 5)  Humalog 100 Unit/ml  Soln (Insulin Lispro (Human)) .... Sliding Scale---  20-25 Three Times A Day Before Meals 6)  Lantus Solostar 100 Unit/ml  Soln (Insulin Glargine) .... 40 Units Nightly 7)  Oxycodone-Acetaminophen 10-650 Mg  Tabs (Oxycodone-Acetaminophen) .Marland Kitchen.. 1 Tablet Q 4 Hours As Needed 8)  Nizatidine 150 Mg  Caps (Nizatidine) .... 2 Pills  Two Times A Day 9)  Diazepam 10 Mg  Tabs (Diazepam) .... Take 1 By Mouth Two Times A Day As Needed 10)  Protonix 40 Mg  Tbec (Pantoprazole Sodium) .... 2 Tablets Two Times A Day Tiwice A Day 11)  Furosemide 80 Mg Tabs (Furosemide) .Marland Kitchen.. 1 Daily As Needed 12)  Bd Insulin Syringe 29g X 1/2" 1 Ml Misc (Insulin Syringe-Needle U-100) .... Use  As Directed 13)  Ketoconazole 2 % Crea (Ketoconazole) .... Apply Three Times A Day To Rash Till Clear 14)  Fluconazole 100 Mg Tabs (Fluconazole) .Marland Kitchen.. 1 Tab Weekly To Prevent Yeast Infections 15)  Duke's Magic Mouthwash .... Take 5 Cc By Mouth Swish and Spit Up To 4 Times Daily For Thrush 16)  Promethazine Hcl 25 Mg Tabs (Promethazine Hcl) .... Take 1 By Mouth Three Times A Day As Needed Nausea 17)  Duragesic-100 100  Mcg/hr Pt72 (Fentanyl) .... Use Every Three Days 18)  Proair Hfa 108 (90 Base) Mcg/act Aers (Albuterol Sulfate) .... 2 Puffs Four Times A Day As Needed Rescue 19)  Levothyroxine Sodium 50 Mcg Tabs (Levothyroxine Sodium) .... Once Daily 20)  Meclizine Hcl 25 Mg Tabs (Meclizine Hcl) .... Take 1 By Mouth Every 6 Hours As Needed 21)  Boniva 3 Mg/73ml Kit (Ibandronate Sodium) .... Injection As Directed 22)  Maalox 600 Mg Chew (Calcium Carbonate Antacid) .... As Needed 23)  Brimonidine Tartrate 0.15 % Soln (Brimonidine Tartrate) .Marland Kitchen.. 1 Drop Each Eye Two Times A Day 24)  Lipitor 20 Mg Tabs (Atorvastatin Calcium) .... Once Daily  Allergies (verified): 1)  ! Sulfa 2)  ! Codeine 3)  ! Morphine 4)  ! Levaquin 5)  ! * Ofloxascin 6)  ! Sodium Benzoate (Sodium Benzoate) 7)  ! 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------------------------------------------Dr Darrold Junker Compression fracture--T6 (?T5 also)---------------- 9/10  Past Surgical History: Last updated: 17-Mar-2007 Eye surgery age 27 Hysterectomy--in 20's for dysplasia Tonsillectomy Left knee cyst removed  Family History: Last updated: 03/17/2007 Dad died @80 . DM  complications Mom had multiple problems. 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  The patient denies shortness of breath at rest, coughing up blood, irregular heartbeats, acid heartburn, indigestion, loss of appetite, weight change, abdominal pain, difficulty swallowing, sore throat, tooth/dental problems, headaches, nasal congestion/difficulty breathing through nose, and sneezing.    Vital Signs:  Patient profile:   65 year old female Height:      63.5 inches Weight:      268.13 pounds BMI:     46.92 O2 Sat:      90 % on Room air Pulse rate:   91 / minute BP sitting:   136 / 68  (left arm) Cuff size:   large  Vitals Entered By: Arman Filter LPN (January 30, 2010 12:30 PM)  O2 Flow:  Room air CC: sick visit.  c/o back and chest pain x 10 days, worse when laying down, pain is 8/10 on pain scale, pain is "throbbing and constant."  Pt also  c/o low grade temp recently, hoarseness, and coughing up thick clear to brown sputum.  Comments Medications reviewed with patient Arman Filter LPN  January 30, 2010 12:30 PM    Physical Exam  Additional Exam:  General: A/Ox3; pleasant and cooperative, NAD,  Morbidly obese, Wheelchair,               room air- SAT 90% SKIN: abrasions from rubbing elbows on her bedsheets.  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 NECK: Supple w/ fair ROM, JVD- none, normal carotid impulses w/o bruits Thyroid- CHEST: Clear to P&A, able to speak in long sentences without coughing. Distant shallow breath sounds, without wheeze . Not tender  HEART: RRR, no m/g/r heard ABDOMEN: Soft and nl;  JYN:WGNF, nl pulses, no edema today. NEURO: Grossly intact to observation      Impression & Recommendations:  Problem # 1:  CHEST PAIN (ICD-786.50)  This seems to be musculoskeletal pain. She has hx of vertebral compresion fx. I considered pneumonia, PE, but favor extension of her vertebral compression fx or cracked rib. Already wearing Duragesic.  Will check Ddimer and CBC with CXR. Script for Z pak  Problem # 2:  UNSPECIFIED CHRONIC BRONCHITIS (ICD-491.9) Sputum is becoming more purulent. i favor bronchityis over pneumonia w/ pleurisy.We discussed antibiotics.   Problem # 3:  SLEEP APNEA, OBSTRUCTIVE (ICD-327.23) She remains compliant with CPAP plus O2 for sleep and says she is less sleepy because of it.   Medications Added to Medication List This Visit: 1)  Levothyroxine Sodium 50 Mcg Tabs (Levothyroxine sodium) .... Once daily 2)  Brimonidine Tartrate 0.15 % Soln (Brimonidine tartrate) .Marland Kitchen.. 1 drop each eye two times a day 3)  Lipitor 20 Mg Tabs (Atorvastatin calcium) .... Once daily 4)  Zithromax Z-pak 250 Mg Tabs (Azithromycin) .... 2 today then one daily  Other Orders: Est. Patient Level IV (62130) T-D-Dimer Fibrin Derivatives Quantitive [phone]) T-2 View CXR (71020TC) TLB-CBC Platelet - w/Differential (85025-CBCD)  Patient Instructions: 1)  Keep scheduled appointment. Call sooner as needed. 2)  A chest x-ray has been recommended.  Your imaging study may require preauthorization.  3)  Lab 4)  script for Z pak antibiotic Prescriptions: ZITHROMAX Z-PAK 250 MG TABS (AZITHROMYCIN) 2 today then one daily  #1 pak x 0   Entered and Authorized by:   Waymon Budge MD   Signed by:   Waymon Budge MD on  01/30/2010   Method used:   Print then Give to Patient   RxID:   [id number]

## 2010-03-13 NOTE — Progress Notes (Signed)
Summary: results  Phone Note Call from Patient Call back at Home Phone [phone]   Caller: Patient Call For: Dr. Maple Hudson Summary of Call: patient called she wanted results from her labs and xrays on 12/22 and she hasnt heard back. She states that she is in horrible pain and has been for about 15 days now. Patient can be reached at [phone] Initial call taken by: Vedia Coffer,  February 05, 2010 2:13 PM  Follow-up for Phone Call        Spoke with patient-she is aware of results and states she finished  Zpak and is still in "bad" pain in her chest, ribs, and back. States it hurts to cough deep. Please advise.Reynaldo Minium CMA  February 05, 2010 2:18 PM     ALLERGIES: LEVAQUIN, SULFA, CODEINE, MORPHOINE, [facility]   Additional Follow-up for Phone Call Additional follow up Details #1::        Per CDY-give Augmentin 875mg  #14 take 1 by mouth two times a day no refills.Reynaldo Minium CMA  February 05, 2010 2:29 PM    Pt aware of rx and knows that [facility] has been [facility] CMA  February 05, 2010 2:29 PM     New/Updated Medications: AUGMENTIN 875-125 MG TABS (AMOXICILLIN-POT CLAVULANATE) take 1 by mouth two times a day Prescriptions: AUGMENTIN 875-125 MG TABS (AMOXICILLIN-POT CLAVULANATE) take 1 by mouth two times a day  #14 x 0   Entered by:   Reynaldo Minium CMA   Authorized by:   Waymon Budge MD   Signed by:   Reynaldo Minium CMA on 02/05/2010   Method used:   Electronically to        [facility] [phone]* (retail)       [address]       [place], [place]  [address]       Ph: [phone]       Fax: [phone]   RxID:   [id number]

## 2010-03-13 NOTE — Progress Notes (Signed)
Summary: nos appt  Phone Note Call from Patient   Caller: [email or web address]  Call For: young Summary of Call: In ref to nos from 1/3, pt has appt on 2/7. Initial call taken by: Darletta Moll,  February 13, 2010 9:50 AM

## 2010-03-13 NOTE — Progress Notes (Signed)
Summary: would like to speak to Dr Maple Hudson regarding her chest pain  Phone Note Call from Patient   Caller: Patient Call For: Dr. Maple Hudson Summary of Call: Patient phoned and wanted to leave a message to have Dr. Maple Hudson call her back. Stated that she spoke to [facility] last week due to having pains in her chest and back. She called back and talked to [facility] and said that Florentina Addison didn't tell her that it was actually pneumonia but  told her that it looked like pneumonia.  Dr. Maple Hudson never called her, patient states that she takes her last pill in the morning and she is still having chest pain when she breathes deep. The pain is not as severe as it was but it is still pretty bad and she is still having problems breathing. Patient can be reached at [phone]. Patient stated that she would like to speak to Dr. Maple Hudson . also patient wanted her records sent to her PCP Dr. Quillian Quince but she still hasn't received them Initial call taken by: Vedia Coffer,  February 11, 2010 2:55 PM  Follow-up for Phone Call        I spoke with patient-states that she is having back/chest pain still after finishing abx's. I have set up appt for pt for tomorrow at 215pm. Pt would still like CDY to call her this afternoon.Reynaldo Minium CMA  February 11, 2010 4:03 PM   Pt aware that Records have been faxed to Dr. Quillian Quince.Reynaldo Minium CMA  February 11, 2010 4:05 PM   Additional Follow-up for Phone Call Additional follow up Details #1::        I called. She says sick now x 16 days. Tussive pains. Pain meds are usually rx'd by her rheumatologist. Scant sputum usually clear. Face gets flushed. some fever ? yesterday. Not as much coughing now.  We gave Zpak and then augmentin which is finishing tomorrow.  We will send script for doxy and records to Dr Quillian Quince. Husband having trrouble getting off to take her to doctor.   doxycycline 100 mg, # 14  1 two times a day x 7 days Additional Follow-up by: Waymon Budge MD,  February 11, 2010 5:14 PM      Additional Follow-up for Phone Call Additional follow up Details #2::    I have sent Doxycycline Rx per CDY.Reynaldo Minium CMA  February 11, 2010 5:24 PM   New/Updated Medications: DOXYCYCLINE HYCLATE 100 MG TABS (DOXYCYCLINE HYCLATE) take 1 by mouth two times a day x 7 days Prescriptions: DOXYCYCLINE HYCLATE 100 MG TABS (DOXYCYCLINE HYCLATE) take 1 by mouth two times a day x 7 days  #14 x 0   Entered by:   Reynaldo Minium CMA   Authorized by:   Waymon Budge MD   Signed by:   Reynaldo Minium CMA on 02/11/2010   Method used:   Electronically to        [facility] [phone]* (retail)       [address]       [place], [place]  [address]       Ph: [phone]       Fax: [phone]   RxID:   [id number]

## 2010-03-13 NOTE — Assessment & Plan Note (Signed)
Summary: rov- lung pain- ok per katie//kp   Primary Provider/Referring Provider:  Clayborn Bigness, MD/ [place]  CC:  Acute visit-recent PNA; pain in lungs, chest, back, and and ribs..  History of Present Illness: September 19, 2009- Asthma, allergic rhinitis, OSA, DM, obesity........................Marland Kitchenhusband here Has been using either albuterol or Brovana by neb occasionally  as needed. She declined delivery of a Brovana shipment that she felt got too hot in transport. She doesn't get outdoors much. She seems to kick her legs in sleep and we discussed Restless Legs.  Produces more mucus if she eats hot with cold foods.   Reviewed PFT from 05/2008- mild restriction and obstruction with resonse to dilator. Obesity/ hypoventilation and some deree of reversible obstruction- asthmatic bronchits.  Wheezes more in the morning.  January 30, 2010-  Asthma, allergic rhinitis, OSA, DM, obesity........................Marland Kitchenhusband here Nurse-CC: sick visit.  c/o back and chest pain x 10 days, worse when laying down, pain is 8/10 on pain scale, pain is "throbbing and constant."  Pt also c/o low grade temp recently, hoarseness, and coughing up thick clear to brown sputum.  Acute- Onset left flank pain 5 days ago. Foot slipped getting in car 2 days prior. .  Then moved around to midsternal and bilat ribs. Cough productive, white yellow brown., thick. Tussive pain. Has been having heart burn. Still throbbing sore left post lateral where it began. Low grade fever or chills Legs not different  February 28, 2010-  Asthma, allergic rhinitis, OSA, DM, obesity........................Marland Kitchenhusband here Nurse-CC: Acute visit-recent PNA; pain in lungs, chest, back, and ribs Says back and chest pains are better, but still "so bad". Began in left back as noted above and now c/o diffuse pain around bilateral chest. Nebs help but pain takes her breath. Still coughng productive white phlegm. Occasional 99.9 temp. Pat hx kidney stones.  Urine- incomplete emptying. Has Duragesic pain patch, oxycocone from her rheumatologist. Dr Doreen Salvage.  CXR- showed compression fxs thoracic spine. Hurts to sit, to move. Does best if lies flat on back all night.       Asthma History    Asthma Control Assessment:    Age range: 12+ years    Symptoms: 0-2 days/week    Nighttime Awakenings: 0-2/month    Interferes w/ normal activity: no limitations    SABA use (not for EIB): 0-2 days/week    Asthma Control Assessment: Well Controlled   Preventive Screening-Counseling & Management  Alcohol-Tobacco     Smoking Status: quit     Year Quit: 1991  Current Medications (verified): 1)  Albuterol Sulfate (2.5 Mg/72ml) 0.083% Nebu (Albuterol Sulfate) .Marland Kitchen.. 1 Vial in Nebulizer Four Times A Day As Needed 2)  O2 3l For Sleep With Cpap .... 2l Portable,  Advanced 3)  Cpap 11 Cwp With 3 L O2 Advanced 4)  Prednisone 10 Mg  Tabs (Prednisone) .Marland Kitchen.. 1 Daily Sometimes More Up To 4 Tablets 5)  Humalog 100 Unit/ml  Soln (Insulin Lispro (Human)) .... Sliding Scale---  20-25 Three Times A Day Before Meals 6)  Lantus Solostar 100 Unit/ml  Soln (Insulin Glargine) .... 40 Units Nightly 7)  Oxycodone-Acetaminophen 10-650 Mg  Tabs (Oxycodone-Acetaminophen) .Marland Kitchen.. 1 Tablet Q 4 Hours As Needed 8)  Nizatidine 150 Mg  Caps (Nizatidine) .... 2 Pills  Two Times A Day 9)  Diazepam 10 Mg  Tabs (Diazepam) .... Take 1 By Mouth Two Times A Day As Needed 10)  Protonix 40 Mg  Tbec (Pantoprazole Sodium) .... 2 Tablets Two Times A Day Tiwice  A Day 11)  Furosemide 80 Mg Tabs (Furosemide) .Marland Kitchen.. 1 Daily As Needed 12)  Bd Insulin Syringe 29g X 1/2" 1 Ml Misc (Insulin Syringe-Needle U-100) .... Use As Directed 13)  Ketoconazole 2 % Crea (Ketoconazole) .... Apply Three Times A Day To Rash Till Clear 14)  Fluconazole 100 Mg Tabs (Fluconazole) .Marland Kitchen.. 1 Tab Weekly To Prevent Yeast Infections 15)  Duke's Magic Mouthwash .... Take 5 Cc By Mouth Swish and Spit Up To 4 Times Daily For  Thrush 16)  Promethazine Hcl 25 Mg Tabs (Promethazine Hcl) .... Take 1 By Mouth Three Times A Day As Needed Nausea 17)  Duragesic-100 100 Mcg/hr Pt72 (Fentanyl) .... Use Every Three Days 18)  Proair Hfa 108 (90 Base) Mcg/act Aers (Albuterol Sulfate) .... 2 Puffs Four Times A Day As Needed Rescue 19)  Levothyroxine Sodium 50 Mcg Tabs (Levothyroxine Sodium) .... Once Daily 20)  Meclizine Hcl 25 Mg Tabs (Meclizine Hcl) .... Take 1 By Mouth Every 6 Hours As Needed 21)  Boniva 3 Mg/68ml Kit (Ibandronate Sodium) .... Injection As Directed 22)  Maalox 600 Mg Chew (Calcium Carbonate Antacid) .... As Needed 23)  Brimonidine Tartrate 0.15 % Soln (Brimonidine Tartrate) .Marland Kitchen.. 1 Drop Each Eye Two Times A Day 24)  Lipitor 20 Mg Tabs (Atorvastatin Calcium) .... Once Daily  Allergies (verified): 1)  ! Sulfa 2)  ! Codeine 3)  ! Morphine 4)  ! Levaquin 5)  ! * Ofloxascin 6)  ! Sodium Benzoate (Sodium Benzoate) 7)  ! 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------------------------------------------Dr Darrold Junker Compression fracture--T6 (?T5 also)---------------- 9/10  Past Surgical History: Last updated: 03-05-07 Eye surgery age  32 Hysterectomy--in 20's for dysplasia Tonsillectomy Left knee cyst removed  Family History: Last updated: 2007-03-05 Dad died @80 . DM complications Mom had multiple problems. 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  The patient denies coughing up blood, irregular heartbeats, acid heartburn, indigestion, loss of appetite, weight change, abdominal pain, difficulty swallowing, sore throat, tooth/dental problems, headaches, nasal congestion/difficulty breathing through nose, and sneezing.    Vital Signs:  Patient profile:   65 year old female Height:      63.5 inches Weight:      273.38 pounds BMI:     47.84 O2 Sat:      93 % on Room air Pulse rate:   80 / minute BP sitting:   124 / 82  (left arm) Cuff size:   large  Vitals Entered By: Reynaldo Minium CMA (February 28, 2010 1:36 PM)  O2 Flow:  Room air  CC: Acute visit-recent PNA; pain in lungs, chest, back, and ribs.   Physical Exam  Additional Exam:  General: A/Ox3; pleasant and cooperative, NAD,  Morbidly obese, Wheelchair,  tearful             room air- SAT 93% SKIN: abrasions from rubbing elbows on her bedsheets.  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 NECK: Supple w/ fair ROM, JVD- none, normal carotid impulses w/o bruits Thyroid- CHEST: Clear to P&A, able to speak  in long sentences without coughing. Distant shallow breath sounds, without wheeze . Not tender  HEART: RRR, no m/g/r heard ABDOMEN: Soft and nl;  ZOX:WRUE, nl pulses, no edema today. NEURO: Grossly intact to observation      Impression & Recommendations:  Problem # 1:  MUSCULOSKELETAL PAIN (ICD-781.99)  I think she is describing radicular pain from compression fractures in thoracic spine. She is already on significant amounts of pain med. I don't want to complicate her primary pain management, which should come from Dr Quillian Quince and Dr Rae Mar if my impression is correct. Since she is here today, I recommended CT chest with contrast. She is afraid of other dx. She may be a candidate for kyphoplasty.   Other Orders: Est. Patient Level III (45409) TLB-BMP (Basic Metabolic Panel-BMET) (80048-METABOL) TLB-CBC Platelet - w/Differential (85025-CBCD) Radiology Referral (Radiology)  Patient Instructions: 1)  Keep scheduled February appointment 2)  A Chest CT with Contrast has been recommended.  Your imaging study may require preauthorization.  3)  Lab 4)  cc Dr Quillian Quince

## 2010-03-13 NOTE — Progress Notes (Signed)
Summary: appointment  Phone Note Call from Patient Call back at Home Phone [phone]   Caller: Patient Call For: dr Maple Hudson Summary of Call: Patient phoned she had a spell last night where she couldn't breathe she stated that she took a breathing treatment about 2 am. She stated sometimes she just can't get a good breath also she is still having back pains into her chest. She would like to be seen this week if possible. Pt can be reached at [phone] Initial call taken by: Vedia Coffer,  February 17, 2010 2:06 PM  Follow-up for Phone Call        Spoke with pt.  She states that she has been having "spells" where she feels like she is unable to take in a deep enough breath.  She is also c/o sore feeling in her chest that has started to radiate to her back.  She is requesting appt with CDY this wk- pm preffered. Nothing available. Pls advise thanks Follow-up by: Vernie Murders,  February 17, 2010 3:27 PM  Additional Follow-up for Phone Call Additional follow up Details #1::        per cdy ask her to see if she can see her pmd since we are booked up or ask pt if she knows something she wants called in [facility]  February 17, 2010 3:38 PM   Spoke with pt and notified of the above.  She states that she wants appt so will see her PCP.  Additional Follow-up by: Vernie Murders,  February 17, 2010 3:51 PM

## 2010-03-18 ENCOUNTER — Ambulatory Visit: Payer: Self-pay | Admitting: Internal Medicine

## 2010-03-18 ENCOUNTER — Emergency Department: Payer: Medicare Other | Admitting: Emergency Medicine

## 2010-03-25 ENCOUNTER — Ambulatory Visit: Payer: Medicare Other

## 2010-03-27 ENCOUNTER — Encounter: Payer: Self-pay | Admitting: Internal Medicine

## 2010-04-08 NOTE — Letter (Signed)
Summary: CMN for filter/[facility]  CMN for filter/[facility]   Imported By: Sherian Rein 04/02/2010 11:55:12  _____________________________________________________________________  External Attachment:    Type:   Image     Comment:   External Document

## 2010-06-24 NOTE — Procedures (Signed)
NAMESEQUITA, WISE                 ACCOUNT NO.:  [id number]   MEDICAL RECORD NO.:  [id number]          PATIENT TYPE:  OUT   LOCATION:  SLEEP CENTER                 FACILITY:  [facility]   PHYSICIAN:  Clinton D. Maple Hudson, MD, FCCP, FACPDATE OF BIRTH:  Apr 30, 1945   DATE OF STUDY:                            NOCTURNAL POLYSOMNOGRAM   REFERRING PHYSICIAN:   INDICATION FOR STUDY:  Hypersomnia with sleep apnea.   EPWORTH SLEEPINESS SCORE:  12/24.  Weight 267 pounds.  Height 5 feet 4  inches.   HOME MEDICATIONS:  Listed and reviewed.   SLEEP ARCHITECTURE:  Total sleep time short 277 minutes, sleep  efficiency 73%.  Stage I was 5%, stage II 86%, stage III absent, REM 9%  of total sleep time.  Sleep latency 58 minutes.  REM latency 61 minutes.  Awake after sleep onset 45 minutes.  Arousal index 18.8.  Advil,  diazepam, oxycodone, and Fentanyl patch were taken at bedtime.   RESPIRATORY DATA:  Apnea/hypopnea index (AHI, RDI) 6.5 obstructive  events per hour indicating mild obstructive sleep apnea/hypopnea  syndrome.  Events were limited to a total of 30 hypopneas.  All sleep  and events were recorded while supine.  REM AHI 62.4.  There were  insufficient events to qualify for CPAP titration by split protocol on  this study night.   OXYGEN DATA:  Mild snoring with oxygen desaturation nadir 75%, mean  oxygen saturation through the study was 88% on room air suggesting  underlying cardiopulmonary disease.   CARDIAC DATA:  Sinus rhythm with occasional PACs.   MOVEMENT-PARASOMNIA:  No significant movement disturbance.   IMPRESSIONS-RECOMMENDATIONS:  1. Short total sleep time despite sedating bedtime medications noted      above.  2. Mild obstructive sleep apnea/hypopnea syndrome, AHI 6.5 per hour      with most events while supine and in REM.  Mild snoring with oxygen      desaturation nadir 75%.  3. Sustained hypoxemia with mean oxygen saturation through the study      88% on room air.  Consider  providing nocturnal oxygen if not      already available at home.      Clinton D. Maple Hudson, MD, [facility], FACP  Diplomate, Biomedical engineer of Sleep Medicine  Electronically Signed     CDY/MEDQ  D:  10/03/2006 12:14:55  T:  10/03/2006 20:47:03  Job:  [id number]

## 2010-06-24 NOTE — Assessment & Plan Note (Signed)
[facility]                             PULMONARY OFFICE NOTE   [name], [name]                        MRN:          [id number]  DATE:10/14/2006                            DOB:          1945/12/31    PULMONARY FOLLOWUP.   PROBLEM:  1. Obstructive sleep apnea.  2. Allergic rhinitis.  3. Esophageal reflux.  4. Polymyalgia rheumatica.   HISTORY:  She went for her sleep study but had environmental  discomforts.  She was bothered by what she thought was odor of cleaning  solution and maybe perfume.  She complains at home that she will sleep  so soundly that she drools and sometimes seems to bite her tongue.  She  had previously quit CPAP because of nasal congestion, and she had had  home oxygen provided before but stopped it because the thought of  needing it made her depressed.  Her sleep study was done at the [facility], recording a short total sleep time of 277 minutes despite  taking Advil, diazepam, oxycodone and fentanyl patch, suggesting she has  developed tolerance to these sedatives.  She had only minimal  obstructive apnea with an index of 6.5 per hour despite the sedation and  those events were mostly while supine.  Snoring was mild.  She had  sustained hypoxemia with a mean oxygen saturation through the study of  only 88% on room air and desaturation nadir of 75% which would qualify  her for home oxygen at night.   OBJECTIVE:  Weight 282 pounds, BP 128/76, pulse 120, room air saturation  91%.  Using a cane.  Obese.  Somewhat tearful.  Worker breathing was not  increased.  Pulse was regular, she had no edema.   IMPRESSION:  1. Minimal obstructive sleep apnea, index 6.5, no specific therapy      really needed for that.  2. Sustained nocturnal hypoxemia on the night of her sleep study with      a desaturation nadir to 75% and sustained mean oxygen saturation      through the study of 88%.  This is probably due to obesity  with      hypoventilation, but at some point we will need to do a pulmonary      function test.  3. Esophageal reflux.  4. Complains of decreased memory.  If it is at all related to her      sleep issues, which was the Neurology concern, then providing      nocturnal oxygen is likely to make for difference then trying to      treat a sleep apnea index of 6.5 per hour.   PLAN:  1. Weight loss and encouragement to sleep off flat of back.  2. Try home oxygen at 2L per minute for sleep.  3. We will anticipate need for overnight oximetry once she is      established with oxygen and we      will want to evaluate pulmonary function status.  4. We have scheduled her to return in 2 months, earlier p.r.n.  Clinton D. Maple Hudson, MD, [facility], FACP  Electronically Signed    CDY/MedQ  DD: 10/17/2006  DT: 10/17/2006  Job #: [id number]

## 2010-06-24 NOTE — Assessment & Plan Note (Signed)
[facility]                             PULMONARY OFFICE NOTE   [name], [name]                        MRN:          [id number]  DATE:09/21/2006                            DOB:          1945/06/07    PROBLEM:  1. Obstructive sleep apnea.  2. Allergic rhinitis.  3. Esophageal reflux.  4. Polymyalgia rheumatica.   HISTORY:  This former smoker was previously followed at [facility], where I last saw her in 2002.  She comes now to establish here.  She had worked before with Dr.  [place] Callas for allergy.  She is going to  establish with Dr. Duncan Dull, who is just getting established now in  [place].  That office contact has not yet been made.  She works with  a rheumatologist at [facility] and an oncologist at [facility]  She was  using CPAP at 7 [facility] but had quit because of head congestion a long time  ago.  She feels her memory is declining and husband tells her she snores  loudly and stops.  Her neurologist told her to resume CPAP therapy.  She  admits to feeling exhausted.  She has had an M spike and is being  evaluated by an oncologist.   MEDICATIONS:  1. Albuterol nebulizer solution q.i.d. p.r.n.  2. Oxycodone/CPAP 10/650 q.4 h. p.r.n.  3. Flonase two sprays each nostril.  4. Insulin.  5. Promethazine 25 mg.  6. Furosemide 20 mg x2 p.r.n.  7. Protonix 40 mg x2 b.i.d.  8. Vitamins.  9. Diazepam 10 mg q.4 h. p.r.n.  10.Avapro 150 mg.  11.Boniva every three months.  12.__________ 150 mg two b.i.d.  13.Fentanyl patch 75 mcg every three days.  14.Nystatin q.i.d.  15.Meclizine 25 mg p.r.n.  16.Prednisone 10 mg daily.   DRUG INTOLERANCES:  1. SULFA.  2. CODEINE.  3. SODIUM BENZOATE.  4. MORPHINE.  5. OFLOXACIN EYE DROPS.  6. LEVAQUIN.   OBJECTIVE:  Weight 266 pounds, blood pressure 142/82, pulse 88, room air  saturation 93%.  She is obese.  Palate is facing 3-4/4 and there is a torus.  She is  talkative and alert.  HEENT:  Nasal airways clear.  Pharynx without inflammation.  Voice  quality normal.  NECK:  No stridor or thyromegaly.  LUNGS:  Clear.  HEART:  Heart sounds regular without murmur or gallop.  There is no  tremor.   IMPRESSION:  1. Obstructive sleep apnea, status uncertain.  2. Allergic rhinitis.  3. Other problems as listed.   PLAN:  We are scheduling a split protocol sleep study at the [facility]  sleep center and she will be seen in follow-up when that is completed.     Clinton D. Maple Hudson, MD, [facility], FACP  Electronically Signed    CDY/MedQ  DD: 09/25/2006  DT: 09/26/2006  Job #: [id number]   cc:   [facility]

## 2010-06-24 NOTE — Assessment & Plan Note (Signed)
[facility]                             PULMONARY OFFICE NOTE   [name], [name]                        MRN:          [id number]  DATE:12/16/2006                            DOB:          08-Nov-1945    PROBLEMS:  1. Obstructive sleep apnea.  2. Allergic rhinitis.  3. Esophageal reflux.  4. Polymyalgia rheumatica.   HISTORY:  Increased cough and nasal congestion, white phlegm. She has  noticed it more this fall and expresses interest in repeat allergy  testing. Her sleep study in August had shown very mild sleep apnea with  an index of 6.5 per hour. The addition of oxygen at 2 liters for sleep  has been a big help. She and her husband both indicate that she is  sleeping more restfully. She did not establish with her primary care  physician in [place] as planned, and is going to see about getting a  primary care physician in this practice.   MEDICATIONS:  1. Nebulizer with albuterol q.i.d. p.r.n.  2. Oxycodone/APAP 10/650 mg every 4 hours p.r.n.  3. Flonase.  4. Humalog U100 12 units q.i.d. and sliding scale of over 200.  5. Promethazine 25 mg every 6 hours p.r.n. for nausea, used rarely.  6. Furosemide 20 mg x2.  7. Protonix 40 mg, 2 in the morning, and 2 at night.  8. Equate vitamin.  9. Diazepam 10 mg every 4 hours p.r.n.  10.Lantus 40 unit nightly.  11.Avapro 150 mg.  12.Boniva every 3 months.  13.Nizatidine 150 mg x2 b.i.d.  14.Phentanyl patch 75 mcg every 3 days.  15.Nystatin 1 teaspoon q.i.d.  16.Meclizine 25 mg every 6 hours p.r.n.  17.Prednisone 10 mg daily.  18.Oxygen 2 liters for sleep.   Drug intolerant SULFA, CODEINE, SODIUM BENZOATE, MORPHINE, OFLOXACIN EYE  DROPS, and LEVAQUIN.   She has had flu shot.   OBJECTIVE:  Weight 288 pounds, blood pressure 112/68, pulse 91, room air  saturation 98%. She is quite talkative. Breathing is unlabored. She  walks with a cane. She is overweight. Pulse is regular. No murmur heard.  No rales or rhonchi. No edema.   IMPRESSION:  1. Minimal sleep apnea. Weight loss and conservative measures most      appropriate.  2. Allergic rhinitis.  3. Nocturnal hypoxic respiratory failure consistent with obesity      hypoventilation syndrome.  4. Esophageal reflux.   PLAN:  Schedule return 4 months anticipating allergy testing at her  request at that time.     Clinton D. Maple Hudson, MD, [facility], FACP  Electronically Signed    CDY/MedQ  DD: 12/18/2006  DT: 12/19/2006  Job #: [id number]

## 2010-06-27 NOTE — Assessment & Plan Note (Signed)
[facility]                                 ON-CALL NOTE   FREDDIE, NGHIEM                          MRN:          [id number]  DATE:11/12/2008                            DOB:          May 03, 1945    PHONE NUMBER:  [phone].   REGULAR DOCTOR:  Karie Schwalbe, MD   ON-CALL:  Audrie Gallus Tower, MD   CHIEF COMPLAINT:  Sick.   The patient said she was recently hospitalized for compression fracture  in her back.  She was then at a rehab and now she is at home.  She has  been sick to her stomach.  She said in the past, she has gotten  Phenergan for nausea.  She does not know why she gets nauseous that may  be from pain medicine and a combination of factors.  She has no  vomiting, no fever, abdominal pain, or other symptoms.  She said this  simply comes and goes, and she does not want to start vomiting because  it would hurt her back.  I went ahead and called her in Phenergan 25 mg  1 p.o. t.i.d. p.r.n. #10 with [facility] ref[facility] [place] [phone].  I instructed her that if her nausea worsens, if she has trouble keeping  fluids down, any vomiting, abdominal pain, diarrhea, or fever to call  back or go to the emergency room for evaluation.  Otherwise, to call the  office in the morning and make an appointment with Dr. Alphonsus Sias which she  said she would.     Marne A. Tower, MD  Electronically Signed    MAT/MedQ  DD: 11/12/2008  DT: 11/13/2008  Job #: [id number]

## 2010-07-03 ENCOUNTER — Encounter: Payer: Self-pay | Admitting: Internal Medicine

## 2010-07-15 ENCOUNTER — Ambulatory Visit: Payer: Self-pay | Admitting: Internal Medicine

## 2010-08-14 ENCOUNTER — Encounter: Payer: Self-pay | Admitting: Internal Medicine

## 2010-08-14 ENCOUNTER — Ambulatory Visit (INDEPENDENT_AMBULATORY_CARE_PROVIDER_SITE_OTHER): Payer: Medicare Other | Admitting: Internal Medicine

## 2010-08-14 VITALS — BP 112/70 | HR 77 | Ht 63.5 in | Wt 279.6 lb

## 2010-08-14 DIAGNOSIS — E678 Other specified hyperalimentation: Secondary | ICD-10-CM

## 2010-08-14 DIAGNOSIS — G4733 Obstructive sleep apnea (adult) (pediatric): Secondary | ICD-10-CM

## 2010-08-14 DIAGNOSIS — K219 Gastro-esophageal reflux disease without esophagitis: Secondary | ICD-10-CM

## 2010-08-14 NOTE — Assessment & Plan Note (Signed)
Good compliance and control 

## 2010-08-14 NOTE — Patient Instructions (Signed)
Continue present treatment

## 2010-08-14 NOTE — Progress Notes (Signed)
  Subjective:    Patient ID: [name], female    DOB: 02-20-1945, 65 y.o.   MRN: [id number]  HPI 08/14/10- 74 yoF former smoker, followed for asthma/ bronchitis, allergic rhinitis, Sleep apnea, OHS complicated by GERD, DM, polymyalgia rheumatica and CAD.  Husband is here. Last here February 28, 2010- note reviewed She stays in except to go to doctor appointments. As she was wheeled in here she noted pain through sternum to back. Got bad heartburn this week from eating tomatoes. Blowing nose yesterday caused sense of blocked airway. We discussed her therapeutic trial of liquid antacid occasionally.  Sleeps with O2 3 L/M and CPAP 11- sleeps well with it every night, except for her insomnia pattern which comes and goes.  [employer], clinical form filed out.   Review of Systems Constitutional:   No weight loss, night sweats,  Fevers, chills, fatigue, lassitude. HEENT:   No headaches,  Difficulty swallowing,  Tooth/dental problems,  Sore throat,                No sneezing, itching, ear ache, nasal congestion, post nasal drip,   CV:  No  Orthopnea, PND, swelling in lower extremities, anasarca, dizziness, palpitations  GI  No heartburn, indigestion, abdominal pain, nausea, vomiting, diarrhea, change in bowel habits, loss of appetite  Resp: .  No excess mucus,   No coughing up of blood.  No change in color of mucus.  No wheezing.   Skin: no rash or lesions.  GU: no dysuria, change in color of urine, no urgency or frequency.  No flank pain.  MS:  No joint pain or swelling.  No decreased range of motion.  No back pain.  Psych:  No change in mood or affect. No depression or anxiety.  No memory loss.      Objective:   Physical Exam General- Alert, Oriented, Affect-appropriate, Distress- none acute  Obese, wheelchair, room air, non-stop talker  Skin- rash-none, lesions- none, excoriation- none  Lymphadenopathy- none  Head- atraumatic  Eyes- Gross vision intact, PERRLA,  conjunctivae clear secretions  Ears- Hearing, canals, Tm - normal  Nose- Clear, No- Septal dev, mucus, polyps, erosion, perforation   Throat- Mallampati II , mucosa clear , drainage- none, tonsils- atrophic  Neck- flexible , trachea midline, no stridor , thyroid nl, carotid no bruit  Chest - symmetrical excursion , unlabored     Heart/CV- RRR , no murmur , no gallop  , no rub, nl s1 s2                     - JVD- none , edema- none, stasis changes- none, varices- none     Lung- clear to P&A, wheeze- none, cough- none , dullness-none, rub- none     Chest wall-   Abd- tender-no, distended-no, bowel sounds-present, HSM- no  Br/ Gen/ Rectal- Not done, not indicated  Extrem- cyanosis- none, clubbing, none, atrophy- none, strength- nl  Neuro- grossly intact to observation         Assessment & Plan:

## 2010-08-14 NOTE — Assessment & Plan Note (Addendum)
Recognizes some of her chest discomfort- maybe all- is related to esophageal pain. Ongoing careful reflux precautions are important.

## 2010-08-14 NOTE — Assessment & Plan Note (Signed)
Shallow inspiratory volume and lung excursion is apparent on exam. Weight loss is advised.

## 2010-09-11 ENCOUNTER — Telehealth: Payer: Self-pay | Admitting: Internal Medicine

## 2010-09-11 NOTE — Telephone Encounter (Signed)
Pt states she has had a hard time getting neb tubing kit from [place].  Pt states she calls [facility] and they state she needs a new prescription for this.  CY, please advise if this is ok or not.  Thanks.

## 2010-09-11 NOTE — Telephone Encounter (Signed)
Spoke with pt and notified that order has been sent to [facility] for this. Pt verbalized understanding.

## 2010-09-11 NOTE — Telephone Encounter (Signed)
Ok to send script for neb tubing and supplies.

## 2010-09-17 ENCOUNTER — Telehealth: Payer: Self-pay | Admitting: Internal Medicine

## 2010-09-17 NOTE — Telephone Encounter (Signed)
LMTCB

## 2010-09-18 NOTE — Telephone Encounter (Signed)
Spoke with pt. She states that she has called the wrong office, meant to call someone regarding her glucose test strips. She states nothing further needed.

## 2010-12-28 ENCOUNTER — Other Ambulatory Visit: Payer: Self-pay | Admitting: Internal Medicine

## 2011-02-01 ENCOUNTER — Inpatient Hospital Stay: Payer: Medicare Other | Admitting: Internal Medicine

## 2011-02-05 LAB — CA 125: CA 125: 9.6 U/mL (ref 0.0–34.0)

## 2011-02-12 LAB — PLATELET COUNT: Platelet: 260 10*3/uL (ref 150–440)

## 2011-02-17 ENCOUNTER — Ambulatory Visit: Payer: Medicare Other | Admitting: Internal Medicine

## 2011-03-06 ENCOUNTER — Telehealth: Payer: Self-pay | Admitting: Internal Medicine

## 2011-03-06 MED ORDER — ALBUTEROL SULFATE (2.5 MG/3ML) 0.083% IN NEBU: 2.5000 mg | INHALATION_SOLUTION | Freq: Four times a day (QID) | RESPIRATORY_TRACT | Status: DC | PRN

## 2011-03-06 NOTE — Telephone Encounter (Signed)
Albuterol Neb refilled with [facility]. This was called to [place].

## 2011-06-17 ENCOUNTER — Telehealth: Payer: Self-pay | Admitting: Internal Medicine

## 2011-06-17 MED ORDER — DOXYCYCLINE HYCLATE 100 MG PO TABS: ORAL_TABLET | ORAL | Status: DC

## 2011-06-17 NOTE — Telephone Encounter (Signed)
PER CY- Ok to give Doxycycline 100 mg #8 take 2 today then 1 daily until gone no refills. Also, ok to use neb every 4-6 hours.Sent RX to pharmacy.

## 2011-06-17 NOTE — Telephone Encounter (Addendum)
Called spoke with patient who c/o prod cough with yellow mucus, increased SOB, wheezing x1 weeks - denies f/c/s.  Pt is requesting recs - stated that doxycycline has worked for her in the past, but this is not on her med list.  CDY is off this afternoon, will forward to doc of the day.  Dr Shelle Iron please advise, thanks.  Pt asked how often she may use her albuterol neb soln as her box at home states "take as directed."  Advised pt that per her chart, she may take this every 6 hours.  Pt then asked if she may take it more often than that - advised may try every 4-6 hours as needed but she needs to be wary of jitteriness and increased heart rate and may need to go back to the q6h.  Pt verbalized her understanding.  [facility] [place]  Allergies  Allergen Reactions  . Codeine     REACTION: hallucinations  . Levofloxacin     REACTION: sores  . Morphine     REACTION: paralysis  . Pregabalin   . Sodium Benzoate     REACTION: unspecified  . Sulfonamide Derivatives     REACTION: anaphylactic

## 2011-09-23 ENCOUNTER — Telehealth: Payer: Self-pay | Admitting: Internal Medicine

## 2011-09-23 NOTE — Telephone Encounter (Signed)
I spoke with pt and she c/o "unable to breathe". Speaking with pt I could tell she was struggling to talk due to her SOB. She did a breathing tx about 12:30 and it did not help at all. Her chest is very tight and she is wheezing terribly.  Pt is not able to come in bc she is homebound and can not go anywhere unless it is by EMS. She states her husband has to get her up when he goes to work to put her in a chair and she is stuck there all day. Pt stated he door is locked and EMS would not be able to get in without them knocking her door down (she did not want this). She stated she is going to call he husband at work to tell him to come home and she is going to call EMS to pick her up. I advised her will send to Dr. Maple Hudson as an Lorain Childes

## 2012-08-29 ENCOUNTER — Encounter: Payer: Self-pay | Admitting: Internal Medicine

## 2012-08-29 ENCOUNTER — Ambulatory Visit (INDEPENDENT_AMBULATORY_CARE_PROVIDER_SITE_OTHER): Payer: Medicare Other | Admitting: Internal Medicine

## 2012-08-29 VITALS — BP 120/80 | HR 83 | Ht 63.5 in | Wt 317.0 lb

## 2012-08-29 DIAGNOSIS — E678 Other specified hyperalimentation: Secondary | ICD-10-CM

## 2012-08-29 DIAGNOSIS — G4733 Obstructive sleep apnea (adult) (pediatric): Secondary | ICD-10-CM

## 2012-08-29 NOTE — Progress Notes (Signed)
Subjective:    Patient ID: [name], female    DOB: [birth year], 67 y.o.   MRN: [id number]  HPI 08/14/10- 67 yoF former smoker, followed for asthma/ bronchitis, allergic rhinitis, Sleep apnea, OHS complicated by GERD, DM, polymyalgia rheumatica and CAD.  Husband is here. Last here February 28, 2010- note reviewed She stays in except to go to doctor appointments. As she was wheeled in here she noted pain through sternum to back. Got bad heartburn this week from eating tomatoes. Blowing nose yesterday caused sense of blocked airway. We discussed her therapeutic trial of liquid antacid occasionally.  Sleeps with O2 3 L/M and CPAP 11- sleeps well with it every night, except for her insomnia pattern which comes and goes.  [employer] access form filed out.   08/29/12- 67 yoF former smoker, followed for asthma/ bronchitis, allergic rhinitis, Sleep apnea, OHS complicated by GERD, DM, polymyalgia rheumatica and CAD.  Husband here FOLLOW FOR: pt reports lungs have gotten worse, wearing O2 "around the clock" says O2 sats have been running in 70's/80's on room air. O2 3L FOLLOWS FOR: wearing CPAP 11/ [facility] everynight, approx 12-13 hrs per night, doesnt feel she is getting enough o2 from CPAP Occasional cough. Has been on prednisone 10 mg daily for 14 years now for polymyalgia rheumatica CT chest 1/ 24/ 12 IMPRESSION:  No likely significant cardiopulmonary disease. Minimal atelectasis  or scarring at the right base, the lingula and left base.  Multiple thoracic compression fractures in the region from T4-T12  as outlined above. T4, T5, T6, and T8 are age indeterminate but  may be old. T7, T10 and T12 are age indeterminate but are more  suspicious for being acute or subacute fractures.  Provider: Josetta Huddle  ROS-see HPI Constitutional:   No-   weight loss, night sweats, fevers, chills, fatigue, lassitude. HEENT:   No-  headaches, difficulty swallowing, tooth/dental problems, sore  throat,       No-  sneezing, itching, ear ache, nasal congestion, post nasal drip,  CV:  No-   chest pain, orthopnea, PND, swelling in lower extremities, anasarca,  dizziness, palpitations Resp: +shortness of breath with exertion or at rest.              No-   productive cough,  + non-productive cough,  No- coughing up of blood.              No-   change in color of mucus.  No- wheezing.   Skin: No-   rash or lesions. GI:  No-   heartburn, indigestion, abdominal pain, nausea, vomiting,  GU:  MS:  No-   joint pain or swelling.   Neuro-     nothing unusual Psych:  No- change in mood or affect. No depression or anxiety.  No memory loss.    Objective:  OBJ- Physical Exam General- Alert, Oriented, Affect-appropriate, Distress- none acute. +power chair, O2 3 L,            morbidly obese Skin- rash-none, lesions- none, excoriation- none Lymphadenopathy- none Head- atraumatic            Eyes- Gross vision intact, PERRLA, conjunctivae and secretions clear. +strabismus            Ears- Hearing, canals-normal            Nose- Clear, no-Septal dev, mucus, polyps, erosion, perforation             Throat- Mallampati II , mucosa clear , drainage-  none, tonsils- atrophic Neck- flexible , trachea midline, no stridor , thyroid nl, carotid no bruit Chest - symmetrical excursion , unlabored           Heart/CV- RRR , no murmur , no gallop  , no rub, nl s1 s2                           - JVD- none , edema+2-3 chronic, stasis changes-+, varices- none           Lung- clear to P&A, wheeze- none, cough- none , dullness-none, rub- none           Chest wall-  Abd-  Br/ Gen/ Rectal- Not done, not indicated Extrem- cyanosis- none, clubbing, none, atrophy- none, strength- nl Neuro- grossly intact to observation  Assessment & Plan:

## 2012-08-29 NOTE — Patient Instructions (Addendum)
Order- overnight oximetry on CPAP with 3L:o2 during sleep    Dx OSA, OHS              DME [facility]- evaluate for portable O2 concentrator 3L

## 2012-08-31 ENCOUNTER — Telehealth: Payer: Self-pay | Admitting: Internal Medicine

## 2012-08-31 DIAGNOSIS — G4733 Obstructive sleep apnea (adult) (pediatric): Secondary | ICD-10-CM

## 2012-08-31 NOTE — Telephone Encounter (Addendum)
I spoke with the pt and she was last seen on 08-29-12. Pt states that CY ordered on ONO on her CPAP but she wants to know what is going to be done about her SOB during the day. Pt states at rest her sats will drop to low 80's and when she walks it will go to the low 70's. Pt uses her oxygen while she is at home and states sats still drop. I do not see where the pt was tested at last OV. Please advise if you want to order any testing for this? Carron Curie, CMA Allergies  Allergen Reactions  . Codeine     REACTION: hallucinations  . Levofloxacin     REACTION: sores  . Morphine     REACTION: paralysis  . Pregabalin   . Sodium Benzoate     REACTION: unspecified  . Sulfonamide Derivatives     REACTION: anaphylactic

## 2012-08-31 NOTE — Telephone Encounter (Signed)
(  continued) Pt states that she her sats during the day are in the 70's & 80's.  Pt believes that her confusion is due to her lack of O2.  Pt is very concerned & would like to speak w/ someone asap.  [name]

## 2012-09-01 NOTE — Telephone Encounter (Signed)
Spoke with patient-- She is aware orders have been placed and someone from her DME company will contact her to set up Verbalized understanding and nothing further needed at this time

## 2012-09-01 NOTE — Telephone Encounter (Signed)
Spoke to pt. She is aware of CY recs.  Pt is insistent that she be tested so CY can see just how low her sats go with exertion. I advised her that she would have to come to the office to be tested. She wants [facility] to come to her house to test instead of her having to come here, she will more than likely not have any transportation.  CY - please advise. Thanks.

## 2012-09-01 NOTE — Telephone Encounter (Signed)
Ok to have DME do home rest and exercise oximetry on room air

## 2012-09-01 NOTE — Telephone Encounter (Signed)
LMOMTCB X1 for pt 

## 2012-09-01 NOTE — Telephone Encounter (Signed)
Her resting sat here on O2 last visit was 95%. She can turn home O2 up 1-2 liters if needed for exertion. Otherwise need to understand better under what circumstances she gets low saturations. Be sure that oximeter is seated on finger correctly.

## 2012-09-01 NOTE — Telephone Encounter (Signed)
[phone] returning call back

## 2012-09-11 ENCOUNTER — Encounter: Payer: Self-pay | Admitting: Internal Medicine

## 2012-09-11 NOTE — Assessment & Plan Note (Signed)
Compliance good. We need to check oximetry with CPAP at 3 L O2

## 2012-09-11 NOTE — Assessment & Plan Note (Addendum)
I think this is probably her biggest respiratory problem but weight loss does not seem likely. Plan-overnight oximetry on CPAP with 3 L. Her DME will evaluate for continuous portable concentrator

## 2012-09-23 ENCOUNTER — Ambulatory Visit: Payer: Medicare Other | Admitting: Internal Medicine

## 2012-11-03 ENCOUNTER — Telehealth: Payer: Self-pay | Admitting: Internal Medicine

## 2012-11-03 DIAGNOSIS — J9610 Chronic respiratory failure, unspecified whether with hypoxia or hypercapnia: Secondary | ICD-10-CM

## 2012-11-03 DIAGNOSIS — G4733 Obstructive sleep apnea (adult) (pediatric): Secondary | ICD-10-CM

## 2012-11-03 NOTE — Telephone Encounter (Signed)
Please order DME- replacement CPAP mask of choice- needs to be refitted due to leaks   Dx OSA                                - ONOX on her CPAP plus O2 3L. Want to compare to result husband is reporting from their oximeter.     Dx OSA, Chronic respiratory failure.

## 2012-11-03 NOTE — Telephone Encounter (Signed)
Last OV 7/14 I spoke with the pt and she states at night her oxygen levels are dropping despite being on cpap and 3 liters oxygen. Pt states her husband will check her levels through the night and they are around 80%. Pt states she is also having issues with her mask fitting properly and feels like a lot of air leaks out of it. She states she has it as tight as it will go without cutting into her face too much but this has not helped. Please advise. Carron Curie, CMA

## 2012-11-04 ENCOUNTER — Telehealth: Payer: Self-pay | Admitting: Internal Medicine

## 2012-11-04 DIAGNOSIS — G4733 Obstructive sleep apnea (adult) (pediatric): Secondary | ICD-10-CM

## 2012-11-04 NOTE — Telephone Encounter (Signed)
Elnita Maxwell advised and order placed. Carron Curie, CMA

## 2012-11-04 NOTE — Telephone Encounter (Signed)
Pt is aware of recs. Nothing further needed 

## 2012-11-04 NOTE — Telephone Encounter (Signed)
Pt is returning triage's call.  Pt is in a lot of pain.  Didn't realized that phone ringer was turned off.

## 2012-11-04 NOTE — Telephone Encounter (Signed)
I spoke with Elnita Maxwell from [facility] and she was sent to the pt home to do a mask fit. She states the pt mask fits fine. Pt current setting is 11.  She states the pt mentioned that she has gained some weight and some of her other complaints makes Elnita Maxwell feel the best thing to do would be for CY to order placing pt on Auto 11-20 x 2 weeks. Elnita Maxwell is at the pt home and can make this change now. Please advise. Carron Curie, CMA

## 2012-11-04 NOTE — Telephone Encounter (Signed)
Per CY-okay to change CPAP auto titrate 11-20 x 2 weeks for pressure recommendation.

## 2012-11-04 NOTE — Telephone Encounter (Signed)
lmomtcb for pt 

## 2012-11-21 ENCOUNTER — Telehealth: Payer: Self-pay | Admitting: Internal Medicine

## 2012-11-21 NOTE — Telephone Encounter (Signed)
Spoke with patient-states she has O2 QHS with CPAP but recently started using her O2 during the day as she is SOB and checks her O2 sats-results are 70's to 80's on Room Air.  She has put herself on 3L/M O2 continuous; she continues to have SOB and 80's for O2 readings even on O2 when up moving around. Pt had ONO done 11-07-12(results attached to message) but we would have to qualify patient here in the office for O2 during the day.   Also, patient would like to know if she can take concentrator to the bathroom so she can get in the bath(and keep her nasal cannula on while bathing.). Please advise.   Pt is scheduled to see CY on 01/06/13.

## 2012-11-21 NOTE — Telephone Encounter (Signed)
Her ONOX on CPAP with O2 3L still showed desaturation. We may need to change her CPAP, but she can try sleeping with CPAP and O2 at 5L  She can take her concentrator into the bathroom, just don't get the electrical machine wet.  We will need to walk her at least a little here on room air to qualify her for portable O2.

## 2012-11-21 NOTE — Telephone Encounter (Signed)
I spoke with pt. Made her of CDY recs. She will await until her appt to do the walking sats. Nothing further needed

## 2012-11-30 ENCOUNTER — Inpatient Hospital Stay: Payer: Self-pay | Admitting: Student

## 2012-11-30 LAB — COMPREHENSIVE METABOLIC PANEL
BUN: 11 mg/dL (ref 7–18)
Bilirubin,Total: 0.5 mg/dL (ref 0.2–1.0)
EGFR (Non-African Amer.): >60
Glucose: 155 mg/dL — ABNORMAL HIGH (ref 65–99)
Osmolality: 274 (ref 275–301)
SGOT(AST): 11 U/L — ABNORMAL LOW (ref 15–37)
SGPT (ALT): 23 U/L (ref 12–78)
Sodium: 136 mmol/L (ref 136–145)

## 2012-11-30 LAB — TROPONIN I: Troponin-I: <0.02 ng/mL

## 2012-11-30 LAB — CBC WITH DIFFERENTIAL/PLATELET
Basophil #: 0.1 10*3/uL (ref 0.0–0.1)
Basophil %: 0.7 %
Eosinophil #: 0.2 10*3/uL (ref 0.0–0.7)
HGB: 11.8 g/dL — ABNORMAL LOW (ref 12.0–16.0)
Lymphocyte #: 2.1 10*3/uL (ref 1.0–3.6)
Lymphocyte %: 21.2 %
MCHC: 32.4 g/dL (ref 32.0–36.0)
Monocyte #: 0.7 x10 3/mm (ref 0.2–0.9)
Monocyte %: 7.2 %
Neutrophil #: 7 10*3/uL — ABNORMAL HIGH (ref 1.4–6.5)
Neutrophil %: 69.1 %
Platelet: 353 10*3/uL (ref 150–440)
RBC: 4.01 10*6/uL (ref 3.80–5.20)
RDW: 15.9 % — ABNORMAL HIGH (ref 11.5–14.5)
WBC: 10.1 10*3/uL (ref 3.6–11.0)

## 2012-11-30 LAB — CK TOTAL AND CKMB (NOT AT ARMC)
CK, Total: 42 U/L (ref 21–215)
CK, Total: 68 U/L (ref 21–215)
CK-MB: 0.7 ng/mL (ref 0.5–3.6)
CK-MB: <0.5 ng/mL — ABNORMAL LOW (ref 0.5–3.6)

## 2012-12-01 LAB — BASIC METABOLIC PANEL
Anion Gap: 7 (ref 7–16)
Creatinine: 0.89 mg/dL (ref 0.60–1.30)
EGFR (Non-African Amer.): >60
Glucose: 282 mg/dL — ABNORMAL HIGH (ref 65–99)
Potassium: 2.6 mmol/L — ABNORMAL LOW (ref 3.5–5.1)
Sodium: 131 mmol/L — ABNORMAL LOW (ref 136–145)

## 2012-12-01 LAB — POTASSIUM: Potassium: 3.2 mmol/L — ABNORMAL LOW (ref 3.5–5.1)

## 2012-12-02 LAB — POTASSIUM: Potassium: 3.5 mmol/L (ref 3.5–5.1)

## 2012-12-02 LAB — PHOSPHORUS: Phosphorus: 4.2 mg/dL (ref 2.5–4.9)

## 2012-12-03 LAB — CBC WITH DIFFERENTIAL/PLATELET
Basophil #: 0 10*3/uL (ref 0.0–0.1)
Eosinophil #: 0 10*3/uL (ref 0.0–0.7)
HCT: 34.3 % — ABNORMAL LOW (ref 35.0–47.0)
HGB: 11.2 g/dL — ABNORMAL LOW (ref 12.0–16.0)
Lymphocyte #: 0.7 10*3/uL — ABNORMAL LOW (ref 1.0–3.6)
MCH: 29.3 pg (ref 26.0–34.0)
MCHC: 32.8 g/dL (ref 32.0–36.0)
Monocyte #: 0.6 x10 3/mm (ref 0.2–0.9)
Monocyte %: 4.6 %
Platelet: 334 10*3/uL (ref 150–440)
RDW: 15.7 % — ABNORMAL HIGH (ref 11.5–14.5)
WBC: 13.5 10*3/uL — ABNORMAL HIGH (ref 3.6–11.0)

## 2012-12-03 LAB — BASIC METABOLIC PANEL
Anion Gap: 4 — ABNORMAL LOW (ref 7–16)
BUN: 24 mg/dL — ABNORMAL HIGH (ref 7–18)
Calcium, Total: 8 mg/dL — ABNORMAL LOW (ref 8.5–10.1)
Chloride: 88 mmol/L — ABNORMAL LOW (ref 98–107)
Co2: 44 mmol/L (ref 21–32)
Creatinine: 0.7 mg/dL (ref 0.60–1.30)
EGFR (Non-African Amer.): >60
Glucose: 147 mg/dL — ABNORMAL HIGH (ref 65–99)
Osmolality: 279 (ref 275–301)
Potassium: 3.5 mmol/L (ref 3.5–5.1)
Sodium: 136 mmol/L (ref 136–145)

## 2012-12-03 LAB — TRIGLYCERIDES: Triglycerides: 294 mg/dL — ABNORMAL HIGH (ref 0–200)

## 2012-12-03 LAB — URINALYSIS, COMPLETE
Bilirubin,UR: NEGATIVE
Ketone: NEGATIVE
Nitrite: NEGATIVE
Ph: 5 (ref 4.5–8.0)
WBC UR: 33 /HPF (ref 0–5)

## 2012-12-03 LAB — MAGNESIUM: Magnesium: 1.8 mg/dL

## 2012-12-04 LAB — CBC WITH DIFFERENTIAL/PLATELET
Basophil %: 0.4 %
Eosinophil %: 0 %
HCT: 33 % — ABNORMAL LOW (ref 35.0–47.0)
HGB: 10.6 g/dL — ABNORMAL LOW (ref 12.0–16.0)
MCV: 89 fL (ref 80–100)
Neutrophil %: 83.3 %
Platelet: 303 10*3/uL (ref 150–440)
WBC: 11 10*3/uL (ref 3.6–11.0)

## 2012-12-04 LAB — BASIC METABOLIC PANEL
Calcium, Total: 7.8 mg/dL — ABNORMAL LOW (ref 8.5–10.1)
Chloride: 85 mmol/L — ABNORMAL LOW (ref 98–107)
Creatinine: 0.74 mg/dL (ref 0.60–1.30)
EGFR (African American): >60
Osmolality: 279 (ref 275–301)
Sodium: 135 mmol/L — ABNORMAL LOW (ref 136–145)

## 2012-12-04 LAB — MAGNESIUM: Magnesium: 1.9 mg/dL

## 2012-12-04 LAB — EXPECTORATED SPUTUM ASSESSMENT W REFEX TO RESP CULTURE

## 2012-12-05 LAB — BASIC METABOLIC PANEL
BUN: 35 mg/dL — ABNORMAL HIGH (ref 7–18)
Calcium, Total: 8.1 mg/dL — ABNORMAL LOW (ref 8.5–10.1)
Co2: >45 mmol/L (ref 21–32)
Creatinine: 0.65 mg/dL (ref 0.60–1.30)
EGFR (Non-African Amer.): >60
Osmolality: 276 (ref 275–301)
Potassium: 3.5 mmol/L (ref 3.5–5.1)
Sodium: 132 mmol/L — ABNORMAL LOW (ref 136–145)

## 2012-12-05 LAB — PHOSPHORUS: Phosphorus: 3.4 mg/dL (ref 2.5–4.9)

## 2012-12-05 LAB — URINE CULTURE

## 2012-12-05 LAB — CULTURE, BLOOD (SINGLE)

## 2012-12-06 ENCOUNTER — Encounter: Payer: Self-pay | Admitting: Internal Medicine

## 2012-12-06 LAB — CBC WITH DIFFERENTIAL/PLATELET
Eosinophil #: 0 10*3/uL (ref 0.0–0.7)
Eosinophil %: 0.1 %
Lymphocyte #: 0.7 10*3/uL — ABNORMAL LOW (ref 1.0–3.6)
Lymphocyte %: 4.5 %
MCH: 29.3 pg (ref 26.0–34.0)
MCHC: 33.1 g/dL (ref 32.0–36.0)
Monocyte #: 0.9 x10 3/mm (ref 0.2–0.9)
Monocyte %: 5.4 %
WBC: 15.9 10*3/uL — ABNORMAL HIGH (ref 3.6–11.0)

## 2012-12-06 LAB — BASIC METABOLIC PANEL
BUN: 36 mg/dL — ABNORMAL HIGH (ref 7–18)
Calcium, Total: 8 mg/dL — ABNORMAL LOW (ref 8.5–10.1)
Chloride: 83 mmol/L — ABNORMAL LOW (ref 98–107)
Co2: >45 mmol/L (ref 21–32)
Creatinine: 0.68 mg/dL (ref 0.60–1.30)
EGFR (African American): >60
EGFR (Non-African Amer.): >60
Glucose: 209 mg/dL — ABNORMAL HIGH (ref 65–99)
Osmolality: 275 (ref 275–301)
Sodium: 130 mmol/L — ABNORMAL LOW (ref 136–145)

## 2012-12-06 LAB — TRIGLYCERIDES: Triglycerides: 313 mg/dL — ABNORMAL HIGH (ref 0–200)

## 2012-12-06 LAB — MAGNESIUM: Magnesium: 2.4 mg/dL

## 2012-12-07 LAB — CBC WITH DIFFERENTIAL/PLATELET
Basophil #: 0.1 10*3/uL (ref 0.0–0.1)
Eosinophil #: 0 10*3/uL (ref 0.0–0.7)
Eosinophil %: 0.1 %
HCT: 34.3 % — ABNORMAL LOW (ref 35.0–47.0)
HGB: 11.4 g/dL — ABNORMAL LOW (ref 12.0–16.0)
MCH: 29.5 pg (ref 26.0–34.0)
MCHC: 33.1 g/dL (ref 32.0–36.0)
MCV: 89 fL (ref 80–100)
Monocyte #: 0.9 x10 3/mm (ref 0.2–0.9)
Monocyte %: 5.5 %
Neutrophil #: 13.9 10*3/uL — ABNORMAL HIGH (ref 1.4–6.5)
Platelet: 328 10*3/uL (ref 150–440)
RBC: 3.85 10*6/uL (ref 3.80–5.20)
RDW: 15.6 % — ABNORMAL HIGH (ref 11.5–14.5)
WBC: 15.8 10*3/uL — ABNORMAL HIGH (ref 3.6–11.0)

## 2012-12-07 LAB — BASIC METABOLIC PANEL
Anion Gap: 3 — ABNORMAL LOW (ref 7–16)
BUN: 29 mg/dL — ABNORMAL HIGH (ref 7–18)
Calcium, Total: 8.2 mg/dL — ABNORMAL LOW (ref 8.5–10.1)
Chloride: 84 mmol/L — ABNORMAL LOW (ref 98–107)
EGFR (Non-African Amer.): >60
Glucose: 195 mg/dL — ABNORMAL HIGH (ref 65–99)
Osmolality: 274 (ref 275–301)
Potassium: 3.6 mmol/L (ref 3.5–5.1)

## 2012-12-07 LAB — ALBUMIN: Albumin: 2.6 g/dL — ABNORMAL LOW (ref 3.4–5.0)

## 2012-12-08 ENCOUNTER — Inpatient Hospital Stay
Admission: AD | Admit: 2012-12-08 | Discharge: 2013-01-27 | Disposition: A | Discharge status: Skilled Nursing Facility | Payer: Medicare Other | Source: Ambulatory Visit | Attending: Internal Medicine | Admitting: Internal Medicine

## 2012-12-08 ENCOUNTER — Ambulatory Visit (HOSPITAL_COMMUNITY)
Admission: AD | Admit: 2012-12-08 | Discharge: 2012-12-08 | Disposition: A | Discharge status: Home / Self Care | Payer: Medicare Other | Source: Other Acute Inpatient Hospital | Attending: Internal Medicine | Admitting: Internal Medicine

## 2012-12-08 DIAGNOSIS — J9600 Acute respiratory failure, unspecified whether with hypoxia or hypercapnia: Secondary | ICD-10-CM | POA: Insufficient documentation

## 2012-12-08 DIAGNOSIS — K219 Gastro-esophageal reflux disease without esophagitis: Secondary | ICD-10-CM

## 2012-12-08 DIAGNOSIS — J189 Pneumonia, unspecified organism: Secondary | ICD-10-CM

## 2012-12-08 DIAGNOSIS — M353 Polymyalgia rheumatica: Secondary | ICD-10-CM

## 2012-12-08 DIAGNOSIS — N3000 Acute cystitis without hematuria: Secondary | ICD-10-CM

## 2012-12-08 DIAGNOSIS — J9601 Acute respiratory failure with hypoxia: Secondary | ICD-10-CM

## 2012-12-08 DIAGNOSIS — R609 Edema, unspecified: Secondary | ICD-10-CM

## 2012-12-08 DIAGNOSIS — G4733 Obstructive sleep apnea (adult) (pediatric): Secondary | ICD-10-CM

## 2012-12-08 DIAGNOSIS — I2510 Atherosclerotic heart disease of native coronary artery without angina pectoris: Secondary | ICD-10-CM

## 2012-12-08 DIAGNOSIS — A419 Sepsis, unspecified organism: Secondary | ICD-10-CM

## 2012-12-08 DIAGNOSIS — E119 Type 2 diabetes mellitus without complications: Secondary | ICD-10-CM

## 2012-12-08 DIAGNOSIS — R079 Chest pain, unspecified: Secondary | ICD-10-CM

## 2012-12-08 DIAGNOSIS — Z930 Tracheostomy status: Secondary | ICD-10-CM

## 2012-12-08 LAB — BLOOD GAS, ARTERIAL
Acid-Base Excess: 6.6 mmol/L — ABNORMAL HIGH (ref 0.0–2.0)
Bicarbonate: 31.1 mEq/L — ABNORMAL HIGH (ref 20.0–24.0)
Delivery systems: POSITIVE
Expiratory PAP: 6
FIO2: 0.3 %
O2 Saturation: 97.7 %
Patient temperature: 98.6
pCO2 arterial: 49.9 mmHg — ABNORMAL HIGH (ref 35.0–45.0)
pH, Arterial: 7.412 (ref 7.350–7.450)

## 2012-12-08 LAB — BASIC METABOLIC PANEL
Anion Gap: 2 — ABNORMAL LOW (ref 7–16)
Calcium, Total: 8.6 mg/dL (ref 8.5–10.1)
Chloride: 85 mmol/L — ABNORMAL LOW (ref 98–107)
Creatinine: 0.62 mg/dL (ref 0.60–1.30)
EGFR (African American): >60
EGFR (Non-African Amer.): >60
Glucose: 179 mg/dL — ABNORMAL HIGH (ref 65–99)
Osmolality: 269 (ref 275–301)
Potassium: 3.5 mmol/L (ref 3.5–5.1)
Sodium: 129 mmol/L — ABNORMAL LOW (ref 136–145)

## 2012-12-08 LAB — CBC WITH DIFFERENTIAL/PLATELET
Comment - H1-Com2: NORMAL
HCT: 37 % (ref 35.0–47.0)
HGB: 12.1 g/dL (ref 12.0–16.0)
Lymphocytes: 5 %
MCV: 89 fL (ref 80–100)
Monocytes: 4 %
Myelocyte: 1 %
NRBC/100 WBC: 1 /
Platelet: 372 10*3/uL (ref 150–440)
RBC: 4.19 10*6/uL (ref 3.80–5.20)
RDW: 15.6 % — ABNORMAL HIGH (ref 11.5–14.5)
Segmented Neutrophils: 87 %
Variant Lymphocyte - H1-Rlymph: 3 %

## 2012-12-08 LAB — CULTURE, BLOOD (SINGLE)

## 2012-12-08 LAB — CLOSTRIDIUM DIFFICILE BY PCR

## 2012-12-09 ENCOUNTER — Other Ambulatory Visit (HOSPITAL_COMMUNITY): Payer: Self-pay

## 2012-12-09 LAB — BLOOD GAS, ARTERIAL
Acid-Base Excess: 7 mmol/L — ABNORMAL HIGH (ref 0.0–2.0)
Acid-Base Excess: 7.5 mmol/L — ABNORMAL HIGH (ref 0.0–2.0)
Bicarbonate: 31.2 mEq/L — ABNORMAL HIGH (ref 20.0–24.0)
Delivery systems: POSITIVE
Expiratory PAP: 10
FIO2: 0.4 %
FIO2: 0.5 %
Inspiratory PAP: 14
Inspiratory PAP: 14
MECHVT: 500 mL
O2 Saturation: 99 %
O2 Saturation: 98.9 %
Patient temperature: 98.6
RATE: 17 resp/min
RATE: 14 resp/min
TCO2: 35.1 mmol/L (ref 0–100)
pCO2 arterial: 63.5 mmHg (ref 35.0–45.0)
pCO2 arterial: 63.5 mmHg (ref 35.0–45.0)
pH, Arterial: 7.332 — ABNORMAL LOW (ref 7.350–7.450)
pH, Arterial: 7.338 — ABNORMAL LOW (ref 7.350–7.450)
pO2, Arterial: 85.9 mmHg (ref 80.0–100.0)

## 2012-12-09 LAB — CBC WITH DIFFERENTIAL/PLATELET
Basophils Relative: 0 % (ref 0–1)
Eosinophils Absolute: 0 10*3/uL (ref 0.0–0.7)
Hemoglobin: 12.8 g/dL (ref 12.0–15.0)
Lymphocytes Relative: 6 % — ABNORMAL LOW (ref 12–46)
Lymphs Abs: 1.1 10*3/uL (ref 0.7–4.0)
MCH: 29.4 pg (ref 26.0–34.0)
MCV: 90.6 fL (ref 78.0–100.0)
Monocytes Absolute: 1.3 10*3/uL — ABNORMAL HIGH (ref 0.1–1.0)
Neutrophils Relative %: 88 % — ABNORMAL HIGH (ref 43–77)
Platelets: 352 10*3/uL (ref 150–400)
RBC: 4.36 MIL/uL (ref 3.87–5.11)
WBC: 20.7 10*3/uL — ABNORMAL HIGH (ref 4.0–10.5)

## 2012-12-09 LAB — BASIC METABOLIC PANEL
BUN: 23 mg/dL (ref 6–23)
CO2: 32 mEq/L (ref 19–32)
Calcium: 8.9 mg/dL (ref 8.4–10.5)
GFR calc Af Amer: >90 mL/min (ref >90)
GFR calc non Af Amer: >90 mL/min (ref >90)
Glucose, Bld: 266 mg/dL — ABNORMAL HIGH (ref 70–99)
Potassium: 4 mEq/L (ref 3.5–5.1)
Sodium: 134 mEq/L — ABNORMAL LOW (ref 135–145)

## 2012-12-09 LAB — HEPATIC FUNCTION PANEL
ALT: 46 U/L — ABNORMAL HIGH (ref 0–35)
AST: 26 U/L (ref 0–37)
Albumin: 3.1 g/dL — ABNORMAL LOW (ref 3.5–5.2)
Alkaline Phosphatase: 74 U/L (ref 39–117)
Bilirubin, Direct: 0.5 mg/dL — ABNORMAL HIGH (ref 0.0–0.3)
Total Bilirubin: 1 mg/dL (ref 0.3–1.2)

## 2012-12-09 LAB — PROCALCITONIN: Procalcitonin: <0.1 ng/mL

## 2012-12-09 LAB — T4, FREE: Free T4: 1.12 ng/dL (ref 0.80–1.80)

## 2012-12-10 ENCOUNTER — Other Ambulatory Visit (HOSPITAL_COMMUNITY): Payer: Self-pay

## 2012-12-10 LAB — BASIC METABOLIC PANEL
BUN: 26 mg/dL — ABNORMAL HIGH (ref 6–23)
CO2: 32 mEq/L (ref 19–32)
Chloride: 95 mEq/L — ABNORMAL LOW (ref 96–112)
Creatinine, Ser: 0.4 mg/dL — ABNORMAL LOW (ref 0.50–1.10)
GFR calc Af Amer: >90 mL/min (ref >90)
Potassium: 3 mEq/L — ABNORMAL LOW (ref 3.5–5.1)
Sodium: 139 mEq/L (ref 135–145)

## 2012-12-10 LAB — CBC
Hemoglobin: 12.9 g/dL (ref 12.0–15.0)
MCH: 29.8 pg (ref 26.0–34.0)
MCV: 90.3 fL (ref 78.0–100.0)
Platelets: 362 10*3/uL (ref 150–400)
RBC: 4.33 MIL/uL (ref 3.87–5.11)
WBC: 22.5 10*3/uL — ABNORMAL HIGH (ref 4.0–10.5)

## 2012-12-10 LAB — BLOOD GAS, ARTERIAL
Bicarbonate: 33.3 mEq/L — ABNORMAL HIGH (ref 20.0–24.0)
PEEP: 5 cmH2O
TCO2: 34.8 mmol/L (ref 0–100)
pCO2 arterial: 47.8 mmHg — ABNORMAL HIGH (ref 35.0–45.0)
pH, Arterial: 7.457 — ABNORMAL HIGH (ref 7.350–7.450)
pO2, Arterial: 118 mmHg — ABNORMAL HIGH (ref 80.0–100.0)

## 2012-12-11 ENCOUNTER — Other Ambulatory Visit (HOSPITAL_COMMUNITY): Payer: Self-pay

## 2012-12-11 LAB — BASIC METABOLIC PANEL
BUN: 32 mg/dL — ABNORMAL HIGH (ref 6–23)
Calcium: 8.4 mg/dL (ref 8.4–10.5)
Chloride: 101 mEq/L (ref 96–112)
GFR calc Af Amer: >90 mL/min (ref >90)
GFR calc non Af Amer: >90 mL/min (ref >90)
Potassium: 3.4 mEq/L — ABNORMAL LOW (ref 3.5–5.1)
Sodium: 141 mEq/L (ref 135–145)

## 2012-12-11 LAB — CBC
HCT: 37.3 % (ref 36.0–46.0)
MCH: 29.8 pg (ref 26.0–34.0)
MCHC: 32.7 g/dL (ref 30.0–36.0)
Platelets: 340 10*3/uL (ref 150–400)
RDW: 16.3 % — ABNORMAL HIGH (ref 11.5–15.5)
WBC: 17.6 10*3/uL — ABNORMAL HIGH (ref 4.0–10.5)

## 2012-12-11 LAB — URINE CULTURE
Colony Count: NO GROWTH
Culture: NO GROWTH

## 2012-12-11 LAB — BLOOD GAS, ARTERIAL
Acid-Base Excess: 5.5 mmol/L — ABNORMAL HIGH (ref 0.0–2.0)
Bicarbonate: 29.3 mEq/L — ABNORMAL HIGH (ref 20.0–24.0)
FIO2: 0.35 %
MECHVT: 500 mL
O2 Saturation: 98.2 %
Patient temperature: 100.7
TCO2: 30.6 mmol/L (ref 0–100)
pH, Arterial: 7.448 (ref 7.350–7.450)

## 2012-12-12 DIAGNOSIS — J9601 Acute respiratory failure with hypoxia: Secondary | ICD-10-CM

## 2012-12-12 DIAGNOSIS — J9600 Acute respiratory failure, unspecified whether with hypoxia or hypercapnia: Secondary | ICD-10-CM

## 2012-12-12 DIAGNOSIS — J189 Pneumonia, unspecified organism: Secondary | ICD-10-CM

## 2012-12-12 DIAGNOSIS — G4733 Obstructive sleep apnea (adult) (pediatric): Secondary | ICD-10-CM

## 2012-12-12 LAB — CBC
HCT: 36.3 % (ref 36.0–46.0)
MCH: 29.4 pg (ref 26.0–34.0)
MCHC: 32.5 g/dL (ref 30.0–36.0)
MCV: 90.3 fL (ref 78.0–100.0)
RDW: 16.3 % — ABNORMAL HIGH (ref 11.5–15.5)
WBC: 14 10*3/uL — ABNORMAL HIGH (ref 4.0–10.5)

## 2012-12-12 LAB — BLOOD GAS, ARTERIAL
O2 Saturation: 99.6 %
PEEP: 5 cmH2O
Patient temperature: 98.6
RATE: 12 resp/min
pCO2 arterial: 35.7 mmHg (ref 35.0–45.0)
pH, Arterial: 7.53 — ABNORMAL HIGH (ref 7.350–7.450)
pO2, Arterial: 130 mmHg — ABNORMAL HIGH (ref 80.0–100.0)

## 2012-12-12 LAB — BASIC METABOLIC PANEL
BUN: 31 mg/dL — ABNORMAL HIGH (ref 6–23)
CO2: 33 mEq/L — ABNORMAL HIGH (ref 19–32)
Chloride: 99 mEq/L (ref 96–112)
Creatinine, Ser: 0.44 mg/dL — ABNORMAL LOW (ref 0.50–1.10)
Glucose, Bld: 354 mg/dL — ABNORMAL HIGH (ref 70–99)

## 2012-12-12 LAB — MAGNESIUM: Magnesium: 2.2 mg/dL (ref 1.5–2.5)

## 2012-12-12 NOTE — Consult Note (Signed)
PULMONARY  / CRITICAL CARE MEDICINE  Name: [name] MRN: [id number] DOB: 01/25/1946    ADMISSION DATE:  12/08/2012 CONSULTATION DATE:  12/12/12  REFERRING MD :  [facility] PRIMARY SERVICE:  [facility]  CHIEF COMPLAINT:  Acute respiratory failure  BRIEF PATIENT DESCRIPTION:  67 yo morbidly obese female with hx polymalgia rheumatica on chronic prednisone, COPD, OSA/OHS on CPAP initially admitted to [facility] hospital with acute on chronic respiratory failure secondary to pneumonia.  She was tx to [facility] on BiPAP and cont to decline from respiratory standpoint.  She was intubated 10/31 in [facility]. PCCM consulted for vent mgmt and possible trach.   SIGNIFICANT EVENTS / STUDIES:  10/30 Admitted to [facility] on BiPAP  10/31 Failed BiPAP, intubated   LINES / TUBES: 10/31 ETT >>>  CULTURES: 11/1  Urine >>> neg  11/1  Blood >>> pta   Sputum ([place]) >>> H Flu   ANTIBIOTICS: Rocephin (pta) >>> ??? Flagyl (pta) >>> Meropenem 11/1 >>> Vancomycin 10/30 >>>  HISTORY OF PRESENT ILLNESS: 67 yo morbidly obese female with hx polymalgia rheumatica on chronic prednisone, COPD, OSA/OHS on CPAP initially admitted to [facility] hospital with acute on chronic respiratory failure secondary to pneumonia.  She was tx to [facility] on BiPAP and cont to decline from respiratory standpoint.  She was intubated 10/31 in [facility]. PCCM consulted for vent mgmt and possible trach.  PAST MEDICAL HISTORY :  Past Medical History  Diagnosis Date  . Fibromyalgia   . Osteoarthritis   . Asthma   . Type II or unspecified type diabetes mellitus without mention of complication, not stated as uncontrolled   . Esophageal reflux   . Allergic rhinitis, cause unspecified   . OSA on CPAP   . CAD (coronary artery disease)   . Other and unspecified hyperlipidemia    Past Surgical History  Procedure Laterality Date  . Vesicovaginal fistula closure w/ tah    . Tonsillectomy    . Eye surgery     Prior to Admission medications   Medication Sig Start Date  End Date Taking? Authorizing Provider  albuterol (PROAIR HFA) 108 (90 BASE) MCG/ACT inhaler Inhale 2 puffs into the lungs every 6 (six) hours as needed.      Historical Provider, MD  albuterol (PROVENTIL) (2.5 MG/3ML) 0.083% nebulizer solution Take 3 mLs (2.5 mg total) by nebulization every 6 (six) hours as needed. 03/06/11 03/05/12  Waymon Budge, MD  atorvastatin (LIPITOR) 20 MG tablet Take 20 mg by mouth daily.      Historical Provider, MD  calcium carbonate (TUMS - DOSED IN MG ELEMENTAL CALCIUM) 500 MG chewable tablet Chew 1 tablet by mouth daily.      Historical Provider, MD  [employer] INSULIN SYRINGE .5CC/30G 30G X 1/2" 0.5 ML MISC USE AS DIRECTED 12/28/10   Karie Schwalbe, MD  diazepam (VALIUM) 10 MG tablet Take 10 mg by mouth every 12 (twelve) hours as needed.      Historical Provider, MD  dorzolamide (TRUSOPT) 2 % ophthalmic solution Place 1 drop into the right eye 2 (two) times daily.    Historical Provider, MD  fentaNYL (DURAGESIC - DOSED MCG/HR) 100 MCG/HR Place 1 patch onto the skin every 3 (three) days.      Historical Provider, MD  fluconazole (DIFLUCAN) 100 MG tablet Take 100 mg by mouth once a week.      Historical Provider, MD  furosemide (LASIX) 80 MG tablet Take 80 mg by mouth daily.      Historical Provider, MD  ibandronate (BONIVA) 3 MG/3ML SOLN injection Inject 3 mg into the vein once.      Historical Provider, MD  insulin glargine (LANTUS) 100 UNIT/ML injection Inject 40 Units into the skin at bedtime.     Historical Provider, MD  insulin lispro (HUMALOG) 100 UNIT/ML injection Inject 100 Units into the skin QID. Sliding scale    Historical Provider, MD  INSULIN SYRINGE 1CC/29G (B-D INSULIN SYRINGE) 29G X 1/2" 1 ML MISC by Does not apply route.      Historical Provider, MD  ketoconazole (NIZORAL) 2 % cream Apply 1 application topically 3 (three) times daily.      Historical Provider, MD  latanoprost (XALATAN) 0.005 % ophthalmic solution Place 1 drop into both eyes at bedtime.     Historical Provider, MD  levothyroxine (SYNTHROID, LEVOTHROID) 50 MCG tablet Take 50 mcg by mouth daily.      Historical Provider, MD  meclizine (ANTIVERT) 25 MG tablet Take 25 mg by mouth 3 (three) times daily as needed.      Historical Provider, MD  nizatidine (AXID) 150 MG capsule Take 300 mg by mouth 2 (two) times daily.      Historical Provider, MD  nystatin (MYCOSTATIN) 100000 UNIT/ML suspension Take 500,000 Units by mouth 4 (four) times daily.    Historical Provider, MD  oxyCODONE-acetaminophen (PERCOCET) 10-650 MG per tablet Take 1 tablet by mouth every 6 (six) hours as needed.      Historical Provider, MD  pantoprazole (PROTONIX) 40 MG tablet 2 tablets twice a day     Historical Provider, MD  predniSONE (DELTASONE) 10 MG tablet Take 10 mg by mouth daily.      Historical Provider, MD  promethazine (PHENERGAN) 25 MG tablet Take 25 mg by mouth every 8 (eight) hours as needed.      Historical Provider, MD   Allergies  Allergen Reactions  . Codeine     REACTION: hallucinations  . Levofloxacin     REACTION: sores  . Morphine     REACTION: paralysis  . Pregabalin   . Sodium Benzoate     REACTION: unspecified  . Sulfonamide Derivatives     REACTION: anaphylactic   FAMILY HISTORY:  Family History  Problem Relation Age of Onset  . Pancreatic cancer Brother   . Lung cancer Sister   . Colon cancer Maternal Grandfather   . Diabetes Father    SOCIAL HISTORY:  reports that she quit smoking about 20 years ago. Her smoking use included Cigarettes. She has a 12 pack-year smoking history. She has never used smokeless tobacco. She reports that she does not drink alcohol. Her drug history is not on file.  REVIEW OF SYSTEMS: Unable to provide  VITAL SIGNS:  99.9 102 23 151/69 I/O:  -740  PHYSICAL EXAMINATION: General:  Morbidly obese female, NAD on vent Neuro:  Awake, follows commands, communicates needs, MAE, gen weakness HEENT:  Mm dry, ETT Cardiovascular:  s1s2 rrr, distant  Lungs:   resps even non labored on full support, diminished throughout Abdomen:  Obese, soft, +bs Musculoskeletal:  Warm and dry, no significant edema    Recent Labs Lab 12/09/12 0500  12/09/12 1603 12/09/12 2100 12/10/12 0442 12/10/12 0500 12/11/12 0500 12/11/12 1025 12/12/12 0510 12/12/12 0620  HGB 12.8  --   --   --   --  12.9 12.2  --  11.8*  --   WBC 20.7*  --   --   --   --  22.5* 17.6*  --  14.0*  --  PLT 352  --   --   --   --  362 340  --  295  --   NA 134*  --   --   --   --  139 141  --  142  --   K 4.0  --   --   --   --  3.0* 3.4*  --  2.9*  --   CL 91*  --   --   --   --  95* 101  --  99  --   CO2 32  --   --   --   --  32 30  --  33*  --   GLUCOSE 266*  --   --   --   --  262* 277*  --  354*  --   BUN 23  --   --   --   --  26* 32*  --  31*  --   CREATININE 0.36*  --   --   --   --  0.40* 0.41*  --  0.44*  --   CALCIUM 8.9  --   --   --   --  8.9 8.4  --  8.5  --   MG  --   --   --   --   --   --   --   --  2.2  --   AST 26  --   --   --   --   --   --   --   --   --   ALT 46*  --   --   --   --   --   --   --   --   --   ALKPHOS 74  --   --   --   --   --   --   --   --   --   BILITOT 1.0  --   --   --   --   --   --   --   --   --   PROT 6.9  --   --   --   --   --   --   --   --   --   ALBUMIN 3.1*  --   --   --   --   --   --   --   --   --   LATICACIDVEN 1.2  --   --   --   --   --   --   --   --   --   PHART  --   < > 7.332* 7.338* 7.457*  --   --  7.448  --  7.530*  PCO2ART  --   < > 63.5* 63.5* 47.8*  --   --  43.6  --  35.7  PO2ART  --   < > 85.9 138.0* 118.0*  --   --  115.0*  --  130.0*  HCO3  --   < > 32.7* 33.2* 33.3*  --   --  29.3*  --  29.7*  O2SAT  --   < > 95.7 98.9 99.0  --   --  98.2  --  99.6  < > = values in this interval not displayed.  No results found for this basename: GLUCAP,  in the last 168 hours  CXR:   ASSESSMENT / PLAN:  Acute on chronic  respiratory failure Hemophilus influenza pneumoniae Possible pulmonary  edema OSA/OHS Morbid obesity Polymyalgia rheumatica DM / Hyperglycemia, likely steroids exacerbated  Hypokalemia  -->  CPAP 5 PS 5 today -->  ABG after 1 hour -->  Repeat CPAP trial in AM, call MD when complete -->  Will consider extubation in AM -->  Lasix 40 q12h x 48 hours for goal - 1.5L  -->  Continue bronchodilators -->  Continue antibiotics as above -->  Consider tightening glycemic control -->  CXR in AM -->  Will consider tracheostomy if fails extubation -->  May need TTE  [facility], NP 12/12/2012  11:40 AM Pager: [phone] or [phone]  I have personally obtained history, examined patient, evaluated and interpreted laboratory and imaging results, reviewed medical records, formulated assessment / plan and placed orders.  [name], MD Pulmonary and Critical Care Medicine [facility] Pager: [phone]  12/12/2012, 2:26 PM

## 2012-12-13 ENCOUNTER — Other Ambulatory Visit (HOSPITAL_COMMUNITY): Payer: Self-pay

## 2012-12-13 LAB — BLOOD GAS, ARTERIAL
Bicarbonate: 33.1 mEq/L — ABNORMAL HIGH (ref 20.0–24.0)
FIO2: 0.3 %
PEEP: 5 cmH2O
Patient temperature: 98.6
Pressure support: 5 cmH2O
pH, Arterial: 7.459 — ABNORMAL HIGH (ref 7.350–7.450)
pO2, Arterial: 90.4 mmHg (ref 80.0–100.0)

## 2012-12-13 LAB — BASIC METABOLIC PANEL
BUN: 27 mg/dL — ABNORMAL HIGH (ref 6–23)
Creatinine, Ser: 0.41 mg/dL — ABNORMAL LOW (ref 0.50–1.10)
GFR calc Af Amer: >90 mL/min (ref >90)
GFR calc non Af Amer: >90 mL/min (ref >90)
Glucose, Bld: 329 mg/dL — ABNORMAL HIGH (ref 70–99)
Potassium: 3 mEq/L — ABNORMAL LOW (ref 3.5–5.1)

## 2012-12-13 LAB — PHOSPHORUS: Phosphorus: 2 mg/dL — ABNORMAL LOW (ref 2.3–4.6)

## 2012-12-13 NOTE — Progress Notes (Signed)
PULMONARY  / CRITICAL CARE MEDICINE  Name: [name] MRN: [id number] DOB: September 18, 1945    ADMISSION DATE:  12/08/2012 CONSULTATION DATE:  12/12/12  REFERRING MD :  [facility] PRIMARY SERVICE:  [facility]  CHIEF COMPLAINT:  Acute respiratory failure  BRIEF PATIENT DESCRIPTION:  67 yo morbidly obese female with hx polymalgia rheumatica on chronic prednisone, COPD, OSA/OHS on CPAP initially admitted to [facility] hospital with acute on chronic respiratory failure secondary to pneumonia.  She was tx to [facility] on BiPAP and cont to decline from respiratory standpoint.  She was intubated 10/31 in [facility]. PCCM consulted for vent mgmt and possible trach.   SIGNIFICANT EVENTS / STUDIES:  10/30 Admitted to [facility] on BiPAP  10/31 Failed BiPAP, intubated   LINES / TUBES: 10/31 ETT >>>  CULTURES: 11/1  Urine >>> neg  11/1  Blood >>> pta   Sputum () >>> H Flu   ANTIBIOTICS: Rocephin (pta) >>> ??? Flagyl (pta) >>> Meropenem 11/1 >>> Vancomycin 10/30 >>>  OVERNIGHT:  Tachypneic / tachycardic while on SBT  VITAL SIGNS:  98.5 102 20 1148/63 96  PHYSICAL EXAMINATION: General:  Morbidly obese, work of breathing is somewhat increased Neuro:  Awake, nonverbal, nonfocal HEENT:  OETT Cardiovascular:  Regular, no murmurs Lungs:  Bilateral diminished air entry Abdomen:  Obese, soft, bowel sounds present Musculoskeletal:  Moves all extremities  Recent Labs Lab 12/09/12 0500  12/09/12 1603 12/09/12 2100 12/10/12 0442 12/10/12 0500 12/11/12 0500 12/11/12 1025 12/12/12 0510 12/12/12 0620 12/13/12 0525  HGB 12.8  --   --   --   --  12.9 12.2  --  11.8*  --   --   WBC 20.7*  --   --   --   --  22.5* 17.6*  --  14.0*  --   --   PLT 352  --   --   --   --  362 340  --  295  --   --   NA 134*  --   --   --   --  139 141  --  142  --  143  K 4.0  --   --   --   --  3.0* 3.4*  --  2.9*  --  3.0*  CL 91*  --   --   --   --  95* 101  --  99  --  99  CO2 32  --   --   --   --  32 30  --  33*  --  34*  GLUCOSE  266*  --   --   --   --  262* 277*  --  354*  --  329*  BUN 23  --   --   --   --  26* 32*  --  31*  --  27*  CREATININE 0.36*  --   --   --   --  0.40* 0.41*  --  0.44*  --  0.41*  CALCIUM 8.9  --   --   --   --  8.9 8.4  --  8.5  --  8.2*  MG  --   --   --   --   --   --   --   --  2.2  --   --   PHOS  --   --   --   --   --   --   --   --   --   --  2.0*  AST 26  --   --   --   --   --   --   --   --   --   --   ALT 46*  --   --   --   --   --   --   --   --   --   --   ALKPHOS 74  --   --   --   --   --   --   --   --   --   --   BILITOT 1.0  --   --   --   --   --   --   --   --   --   --   PROT 6.9  --   --   --   --   --   --   --   --   --   --   ALBUMIN 3.1*  --   --   --   --   --   --   --   --   --   --   LATICACIDVEN 1.2  --   --   --   --   --   --   --   --   --   --   PHART  --   < > 7.332* 7.338* 7.457*  --   --  7.448  --  7.530*  --   PCO2ART  --   < > 63.5* 63.5* 47.8*  --   --  43.6  --  35.7  --   PO2ART  --   < > 85.9 138.0* 118.0*  --   --  115.0*  --  130.0*  --   HCO3  --   < > 32.7* 33.2* 33.3*  --   --  29.3*  --  29.7*  --   O2SAT  --   < > 95.7 98.9 99.0  --   --  98.2  --  99.6  --   < > = values in this interval not displayed.  CXR: 10/4 >>> Hardware in good position, hypoinflation, small bilateral effusions / bibasilar airspace disease  ASSESSMENT / PLAN:  Acute on chronic respiratory failure Hemophilus influenza pneumoniae Possible pulmonary edema OSA/OHS Morbid obesity Polymyalgia rheumatica DM / Hyperglycemia, likely steroids exacerbated  Hypokalemia  -->  ABG after a hour on 5/5 this AM, then full support -->  Evaluated with Dr. Tyson Alias - NOT a candidate for percutaneous tracheostomy due to morbid obesity  / neck anatomy - would call ENT for surgical tracheostomy  -->  Continue Lasix -->  Continue bronchodilators -->  Continue antibiotics as above -->  Consider tightening glycemic control -->  May need TTE  I have personally obtained  history, examined patient, evaluated and interpreted laboratory and imaging results, reviewed medical records, formulated assessment / plan and placed orders.  [name], MD Pulmonary and Critical Care Medicine [facility] Pager: [phone]  12/13/2012, 10:32 AM

## 2012-12-14 DIAGNOSIS — R609 Edema, unspecified: Secondary | ICD-10-CM

## 2012-12-14 DIAGNOSIS — R079 Chest pain, unspecified: Secondary | ICD-10-CM

## 2012-12-14 DIAGNOSIS — I2510 Atherosclerotic heart disease of native coronary artery without angina pectoris: Secondary | ICD-10-CM

## 2012-12-14 DIAGNOSIS — K219 Gastro-esophageal reflux disease without esophagitis: Secondary | ICD-10-CM

## 2012-12-14 NOTE — Progress Notes (Signed)
PULMONARY  / CRITICAL CARE MEDICINE  Name: [name] MRN: [id number] DOB: October 14, 1945    ADMISSION DATE:  12/08/2012 CONSULTATION DATE:  12/12/12  REFERRING MD :  [facility] PRIMARY SERVICE:  [facility]  CHIEF COMPLAINT:  Acute respiratory failure  BRIEF PATIENT DESCRIPTION:  67 yo morbidly obese female with hx polymalgia rheumatica on chronic prednisone, COPD, OSA/OHS on CPAP initially admitted to [facility] hospital with acute on chronic respiratory failure secondary to pneumonia.  She was tx to [facility] on BiPAP and cont to decline from respiratory standpoint.  She was intubated 10/31 in [facility]. PCCM consulted for vent mgmt and possible trach.   SIGNIFICANT EVENTS / STUDIES:  10/30 Admitted to [facility] on BiPAP  10/31 Failed BiPAP, intubated   LINES / TUBES: 10/31 ETT >>>  CULTURES: 11/1  Urine >>> neg  11/1  Blood >>> pta   Sputum ([place]) >>> H Flu   ANTIBIOTICS: Rocephin (pta) >>> ??? Flagyl (pta) >>> Meropenem 11/1 >>> Vancomycin 10/30 >>>  OVERNIGHT:  Tachypneic / tachycardic while on SBT  VITAL SIGNS:  98.5 102 20 1148/63 96  PHYSICAL EXAMINATION: General:  Morbidly obese, comfortable on vent Neuro:  Awake, nonverbal, nonfocal HEENT:  OETT Cardiovascular:  Regular, no murmurs Lungs:  Bilateral diminished air entry Abdomen:  Obese, soft, bowel sounds present Musculoskeletal:  Moves all extremities  Recent Labs Lab 12/09/12 0500  12/09/12 2100 12/10/12 0442 12/10/12 0500 12/11/12 0500 12/11/12 1025 12/12/12 0510 12/12/12 0620 12/13/12 0525 12/13/12 1010  HGB 12.8  --   --   --  12.9 12.2  --  11.8*  --   --   --   WBC 20.7*  --   --   --  22.5* 17.6*  --  14.0*  --   --   --   PLT 352  --   --   --  362 340  --  295  --   --   --   NA 134*  --   --   --  139 141  --  142  --  143  --   K 4.0  --   --   --  3.0* 3.4*  --  2.9*  --  3.0*  --   CL 91*  --   --   --  95* 101  --  99  --  99  --   CO2 32  --   --   --  32 30  --  33*  --  34*  --   GLUCOSE 266*  --   --   --   262* 277*  --  354*  --  329*  --   BUN 23  --   --   --  26* 32*  --  31*  --  27*  --   CREATININE 0.36*  --   --   --  0.40* 0.41*  --  0.44*  --  0.41*  --   CALCIUM 8.9  --   --   --  8.9 8.4  --  8.5  --  8.2*  --   MG  --   --   --   --   --   --   --  2.2  --   --   --   PHOS  --   --   --   --   --   --   --   --   --  2.0*  --  AST 26  --   --   --   --   --   --   --   --   --   --   ALT 46*  --   --   --   --   --   --   --   --   --   --   ALKPHOS 74  --   --   --   --   --   --   --   --   --   --   BILITOT 1.0  --   --   --   --   --   --   --   --   --   --   PROT 6.9  --   --   --   --   --   --   --   --   --   --   ALBUMIN 3.1*  --   --   --   --   --   --   --   --   --   --   LATICACIDVEN 1.2  --   --   --   --   --   --   --   --   --   --   PHART  --   < > 7.338* 7.457*  --   --  7.448  --  7.530*  --  7.459*  PCO2ART  --   < > 63.5* 47.8*  --   --  43.6  --  35.7  --  47.3*  PO2ART  --   < > 138.0* 118.0*  --   --  115.0*  --  130.0*  --  90.4  HCO3  --   < > 33.2* 33.3*  --   --  29.3*  --  29.7*  --  33.1*  O2SAT  --   < > 98.9 99.0  --   --  98.2  --  99.6  --  96.7  < > = values in this interval not displayed. Dg Chest Port 1 View  12/13/2012   CLINICAL DATA:  Respiratory failure.  EXAM: PORTABLE CHEST - 1 VIEW  COMPARISON:  12/11/2012.  FINDINGS: Patient is rotated to the left. Mediastinum and hilar structures are normal. Endotracheal tube in stable position. Left internal jugular central line noted with tip projected over the superior vena cava. NG tube projected below the left hemidiaphragm. Low lung volumes with atelectasis versus infiltrates in the lung bases, unchanged. Small left pleural effusion cannot be excluded and is unchanged. Widening of the right glenohumeral joint is again noted and unchanged in appearance. This could be related to an arthritic process.  IMPRESSION: 1. Stable positioning of endotracheal tube, central line, and NG tube. 2. Persistent  mild bibasilar atelectasis and or infiltrates. Persistent small left pleural effusion. These findings are unchanged.   Electronically Signed   By: Maisie Fus  Register   On: 12/13/2012 07:24    CXR: see above  ASSESSMENT / PLAN:  Acute on chronic respiratory failure Hemophilus influenza pneumoniae Possible pulmonary edema OSA/OHS Morbid obesity Polymyalgia rheumatica DM / Hyperglycemia, likely steroids exacerbated  Hypokalemia  -->  ABG after a hour on 5/5 this AM, then full support -->  Evaluated with Dr. Tyson Alias - NOT a candidate for percutaneous tracheostomy due to morbid obesity  / neck anatomy - Dr. Thalia Bloodgood ENT for surgical tracheostomy -->  Continue Lasix,  pcxr difficult to help with volume status -->  Continue bronchodilators -->  Continue antibiotics as above -->  Consider tightening glycemic control -->  May need TTE  [facility] Minor ACNP Adolph Pollack PCCM Pager [phone] till 3 pm If no answer page [phone] 12/14/2012, 2:21 PM   I have fully examined this patient and agree with above findings.    And edited infull  Mcarthur Rossetti. Tyson Alias, MD, FACP Pgr: [phone]  Pulmonary & Critical Care

## 2012-12-15 ENCOUNTER — Encounter
Admission: AD | Disposition: A | Discharge status: Skilled Nursing Facility | Payer: Self-pay | Source: Ambulatory Visit | Attending: Internal Medicine

## 2012-12-15 ENCOUNTER — Other Ambulatory Visit (HOSPITAL_COMMUNITY): Payer: Self-pay

## 2012-12-15 ENCOUNTER — Encounter: Payer: Self-pay | Admitting: Certified Registered Nurse Anesthetist

## 2012-12-15 ENCOUNTER — Encounter (HOSPITAL_COMMUNITY): Payer: Self-pay | Admitting: Anesthesiology

## 2012-12-15 ENCOUNTER — Ambulatory Visit (HOSPITAL_COMMUNITY): Payer: Self-pay | Admitting: Anesthesiology

## 2012-12-15 HISTORY — PX: TRACHEOSTOMY TUBE PLACEMENT: SHX814

## 2012-12-15 LAB — CBC
HCT: 40.2 % (ref 36.0–46.0)
Hemoglobin: 13.1 g/dL (ref 12.0–15.0)
MCHC: 32.6 g/dL (ref 30.0–36.0)
MCV: 91.4 fL (ref 78.0–100.0)
RBC: 4.4 MIL/uL (ref 3.87–5.11)
RDW: 16.6 % — ABNORMAL HIGH (ref 11.5–15.5)
WBC: 11.4 10*3/uL — ABNORMAL HIGH (ref 4.0–10.5)

## 2012-12-15 LAB — BASIC METABOLIC PANEL
BUN: 33 mg/dL — ABNORMAL HIGH (ref 6–23)
Calcium: 8.9 mg/dL (ref 8.4–10.5)
Chloride: 97 mEq/L (ref 96–112)
GFR calc Af Amer: >90 mL/min (ref >90)
GFR calc non Af Amer: >90 mL/min (ref >90)
Glucose, Bld: 377 mg/dL — ABNORMAL HIGH (ref 70–99)
Potassium: 2.7 mEq/L — CL (ref 3.5–5.1)
Sodium: 145 mEq/L (ref 135–145)

## 2012-12-15 LAB — POTASSIUM: Potassium: 3.4 mEq/L — ABNORMAL LOW (ref 3.5–5.1)

## 2012-12-15 SURGERY — CREATION, TRACHEOSTOMY
Anesthesia: General | Wound class: Clean Contaminated

## 2012-12-15 MED ORDER — FENTANYL CITRATE 0.05 MG/ML IJ SOLN
INTRAMUSCULAR | Status: DC | PRN
  Administered 2012-12-15 (×2): 50 ug via INTRAVENOUS

## 2012-12-15 MED ORDER — MIDAZOLAM HCL 5 MG/5ML IJ SOLN
INTRAMUSCULAR | Status: DC | PRN
  Administered 2012-12-15: 2 mg via INTRAVENOUS

## 2012-12-15 MED ORDER — SODIUM CHLORIDE 0.9 % IV SOLN
INTRAVENOUS | Status: DC | PRN
  Administered 2012-12-15: 14:00:00 via INTRAVENOUS

## 2012-12-15 MED ORDER — LIDOCAINE-EPINEPHRINE 1 %-1:100000 IJ SOLN
INTRAMUSCULAR | Status: DC | PRN
  Administered 2012-12-15: 5 mL

## 2012-12-15 MED ORDER — 0.9 % SODIUM CHLORIDE (POUR BTL) OPTIME
TOPICAL | Status: DC | PRN
  Administered 2012-12-15: 1000 mL

## 2012-12-15 MED ORDER — PHENYLEPHRINE HCL 10 MG/ML IJ SOLN
INTRAMUSCULAR | Status: DC | PRN
  Administered 2012-12-15: 80 ug via INTRAVENOUS

## 2012-12-15 SURGICAL SUPPLY — 37 items
ATTRACTOMAT 16X20 MAGNETIC DRP (DRAPES) IMPLANT
BLADE SURG 15 STRL LF DISP TIS (BLADE) ×1 IMPLANT
BLADE SURG 15 STRL SS (BLADE) ×1
CLEANER TIP ELECTROSURG 2X2 (MISCELLANEOUS) ×2 IMPLANT
CLOTH BEACON ORANGE TIMEOUT ST (SAFETY) ×2 IMPLANT
COVER SURGICAL LIGHT HANDLE (MISCELLANEOUS) ×2 IMPLANT
ELECT COATED BLADE 2.86 ST (ELECTRODE) ×2 IMPLANT
ELECT REM PT RETURN 9FT ADLT (ELECTROSURGICAL) ×2
ELECTRODE REM PT RTRN 9FT ADLT (ELECTROSURGICAL) ×1 IMPLANT
GAUZE SPONGE 4X4 16PLY XRAY LF (GAUZE/BANDAGES/DRESSINGS) ×2 IMPLANT
GLOVE SS BIOGEL STRL SZ 7.5 (GLOVE) ×2 IMPLANT
GLOVE SUPERSENSE BIOGEL SZ 7.5 (GLOVE) ×2
GOWN PREVENTION PLUS XLARGE (GOWN DISPOSABLE) ×2 IMPLANT
GOWN STRL NON-REIN LRG LVL3 (GOWN DISPOSABLE) ×2 IMPLANT
HOLDER TRACH TUBE VELCRO 19.5 (MISCELLANEOUS) ×2 IMPLANT
KIT BASIN OR (CUSTOM PROCEDURE TRAY) ×2 IMPLANT
KIT ROOM TURNOVER OR (KITS) ×2 IMPLANT
KIT SUCTION CATH 14FR (SUCTIONS) ×2 IMPLANT
NEEDLE HYPO 25X1 1.5 SAFETY (NEEDLE) ×2 IMPLANT
NS IRRIG 1000ML POUR BTL (IV SOLUTION) ×2 IMPLANT
PACK EENT II TURBAN DRAPE (CUSTOM PROCEDURE TRAY) ×2 IMPLANT
PAD ARMBOARD 7.5X6 YLW CONV (MISCELLANEOUS) ×4 IMPLANT
PENCIL BUTTON HOLSTER BLD 10FT (ELECTRODE) ×2 IMPLANT
SPONGE DRAIN TRACH 4X4 STRL 2S (GAUZE/BANDAGES/DRESSINGS) ×2 IMPLANT
SPONGE INTESTINAL PEANUT (DISPOSABLE) IMPLANT
SUT SILK 2 0 SH CR/8 (SUTURE) ×2 IMPLANT
SUT SILK 3 0 TIES 10X30 (SUTURE) IMPLANT
SYR 20CC LL (SYRINGE) ×2 IMPLANT
SYR CONTROL 10ML LL (SYRINGE) ×2 IMPLANT
TOWEL OR 17X24 6PK STRL BLUE (TOWEL DISPOSABLE) ×2 IMPLANT
TOWEL OR 17X26 10 PK STRL BLUE (TOWEL DISPOSABLE) ×2 IMPLANT
TUBE CONNECTING 12X1/4 (SUCTIONS) ×2 IMPLANT
TUBE TRACH 6.0 EXL PROX CUF (TUBING) ×2 IMPLANT
TUBE TRACH SHILEY  6 DIST  CUF (TUBING) IMPLANT
TUBE TRACH SHILEY 10 DIST CUFF (TUBING) IMPLANT
TUBE TRACH SHILEY 8 DIST CUF (TUBING) IMPLANT
WATER STERILE IRR 1000ML POUR (IV SOLUTION) ×2 IMPLANT

## 2012-12-15 NOTE — Preoperative (Signed)
Beta Blockers   Reason not to administer Beta Blockers:Not Applicable 

## 2012-12-15 NOTE — Transfer of Care (Signed)
Immediate Anesthesia Transfer of Care Note  Patient: [name]  Procedure(s) Performed: Procedure(s): TRACHEOSTOMY (N/A)  Patient Location: Nursing Unit  Anesthesia Type:General  Level of Consciousness: sedated and Patient remains intubated per anesthesia plan  Airway & Oxygen Therapy: Patient remains intubated per anesthesia plan and Patient placed on Ventilator (see vital sign flow sheet for setting)  Post-op Assessment: Post -op Vital signs reviewed and stable, Patient moving all extremities X 4 and Report Given.  Post vital signs: Reviewed and stable  Complications: No apparent anesthesia complications

## 2012-12-15 NOTE — Anesthesia Postprocedure Evaluation (Signed)
  Anesthesia Post-op Note  Patient: [name]  Procedure(s) Performed: Procedure(s): TRACHEOSTOMY (N/A)  Patient Location: Nursing Unit  Anesthesia Type:General  Level of Consciousness: sedated and Patient remains intubated per anesthesia plan  Airway and Oxygen Therapy: Patient remains intubated per anesthesia plan and Patient placed on Ventilator (see vital sign flow sheet for setting)  Post-op Pain: Pt seems comfortable  Post-op Assessment: Post-op Vital signs reviewed, Patient's Cardiovascular Status Stable, Patent Airway, No signs of Nausea or vomiting and Pain level not controlled  Post-op Vital Signs: Reviewed and stable  Complications: No apparent anesthesia complications

## 2012-12-15 NOTE — Brief Op Note (Signed)
12/08/2012 - 12/15/2012  3:41 PM  PATIENT:  Gemma Payor Mcleroy  67 y.o. female  PRE-OPERATIVE DIAGNOSIS:  respiratory failure obstructive sleep apnea  POST-OPERATIVE DIAGNOSIS:  respiratory failure obstructive sleep apnea  PROCEDURE:  Procedure(s): TRACHEOSTOMY (N/A) # 6 Shiley XLTPC  SURGEON:  Surgeon(s) and Role:    * Drema Halon, MD - Primary  PHYSICIAN ASSISTANT:   ASSISTANTS: none   ANESTHESIA:   general  EBL:  Total I/O In: 400 [I.V.:400] Out: -   BLOOD ADMINISTERED:none  DRAINS: none   LOCAL MEDICATIONS USED:  XYLOCAINE with EPI  5 cc  SPECIMEN:  No Specimen  DISPOSITION OF SPECIMEN:  N/A  COUNTS:  YES  TOURNIQUET:  * No tourniquets in log *  DICTATION: .Other Dictation: Dictation Number [id number]  PLAN OF CARE: Discharge to home after PACU  PATIENT DISPOSITION:  PACU - hemodynamically stable.   Delay start of Pharmacological VTE agent (>24hrs) due to surgical blood loss or risk of bleeding: yes

## 2012-12-15 NOTE — Consult Note (Signed)
NAMEFLOREEN, TEEGARDEN NO.:  [id number]  MEDICAL RECORD NO.:  [id number]  LOCATION:                               FACILITY:  [facility]  PHYSICIAN:  Kristine Garbe. Ezzard Standing, M.D.DATE OF BIRTH:  12/10/1945  DATE OF CONSULTATION:  12/14/2012 DATE OF DISCHARGE:                                CONSULTATION   REASON FOR CONSULT:  Evaluate patient for tracheostomy.  BRIEF HISTORY:  [name] is a 67 year old female who was initially admitted to [facility] because of respiratory failure.  She has history of COPD as well as obstructive sleep apnea.  She developed upper respiratory infection ending in respiratory failure and requiring intubation.  She has been intubated for over a week and it is recommended that she undergo tracheostomy because of prolonged intubation with chronic obstructive pulmonary disease as well as history of obstructive sleep apnea.  Because of her obesity and neck configuration, Critical Care did not feel like bedside tracheotomy would be adequate and recommended the patient go to the operating room and I was subsequently consulted for tracheostomy.  On examination, the patient is intubated on respirator. She seemed to respond appropriately.  I discussed tracheostomy with her at the bedside and we will plan on doing this tomorrow in the operating room.  IMPRESSION: 1. Acute on chronic respiratory failure. 2. Obesity. 3. Congestive heart failure. 4. Obstructive sleep apnea.  RECOMMENDATIONS:  We will plan on performing tracheostomy tomorrow in the OR.          ______________________________ Kristine Garbe. Ezzard Standing, M.D.     CEN/MEDQ  D:  12/14/2012  T:  12/15/2012  Job:  [id number]

## 2012-12-15 NOTE — Anesthesia Preprocedure Evaluation (Signed)
Anesthesia Evaluation  Patient identified by MRN, date of birth, ID band Patient awake    Reviewed: Allergy & Precautions, H&P , NPO status , Patient's Chart, lab work & pertinent test results  Airway      Comment: Already intubated. Dental   Pulmonary asthma , sleep apnea , former smoker,  Respiratory failure.         Cardiovascular + CAD     Neuro/Psych  Neuromuscular disease    GI/Hepatic GERD-  ,  Endo/Other  diabetes, Type obesity  Renal/GU      Musculoskeletal  (+) Fibromyalgia -  Abdominal   Peds  Hematology   Anesthesia Other Findings   Reproductive/Obstetrics                           Anesthesia Physical Anesthesia Plan  ASA: IV  Anesthesia Plan: General   Post-op Pain Management:    Induction: Intravenous  Airway Management Planned: Oral ETT and Tracheostomy  Additional Equipment:   Intra-op Plan:   Post-operative Plan: Post-operative intubation/ventilation  Informed Consent: I have reviewed the patients History and Physical, chart, labs and discussed the procedure including the risks, benefits and alternatives for the proposed anesthesia with the patient or authorized representative who has indicated his/her understanding and acceptance.     Plan Discussed with: CRNA, Anesthesiologist and Surgeon  Anesthesia Plan Comments:         Anesthesia Quick Evaluation

## 2012-12-16 ENCOUNTER — Encounter (HOSPITAL_COMMUNITY): Payer: Self-pay | Admitting: Otolaryngology

## 2012-12-16 ENCOUNTER — Other Ambulatory Visit (HOSPITAL_COMMUNITY): Payer: Self-pay

## 2012-12-16 DIAGNOSIS — I359 Nonrheumatic aortic valve disorder, unspecified: Secondary | ICD-10-CM

## 2012-12-16 LAB — BASIC METABOLIC PANEL
CO2: 33 mEq/L — ABNORMAL HIGH (ref 19–32)
Calcium: 8.8 mg/dL (ref 8.4–10.5)
Chloride: 97 mEq/L (ref 96–112)
Creatinine, Ser: 0.57 mg/dL (ref 0.50–1.10)
GFR calc Af Amer: >90 mL/min (ref >90)
Sodium: 141 mEq/L (ref 135–145)

## 2012-12-16 LAB — CBC
MCHC: 31.9 g/dL (ref 30.0–36.0)
MCV: 93.3 fL (ref 78.0–100.0)
Platelets: 205 10*3/uL (ref 150–400)
RBC: 4.33 MIL/uL (ref 3.87–5.11)
WBC: 12.1 10*3/uL — ABNORMAL HIGH (ref 4.0–10.5)

## 2012-12-16 LAB — VANCOMYCIN, TROUGH: Vancomycin Tr: 30 ug/mL (ref 10.0–20.0)

## 2012-12-16 NOTE — Op Note (Signed)
NAME:  [name], [name] NO.:  MEDICAL RECORD NO.:  [id number]  LOCATION:                                 FACILITY:  PHYSICIAN:  Kristine Garbe. Ezzard Standing, M.D. DATE OF BIRTH:  DATE OF PROCEDURE:  12/15/2012 DATE OF DISCHARGE:                              OPERATIVE REPORT   PREOPERATIVE DIAGNOSIS:  Respiratory failure with prolonged intubation.  POSTOPERATIVE DIAGNOSIS:  Respiratory failure with prolonged intubation.  OPERATION:  Tracheotomy with a #6 Shiley XLT PC trach.  SURGEON:  Kristine Garbe. Ezzard Standing, MD  ANESTHESIA:  General endotracheal.  COMPLICATIONS:  None.  BRIEF CLINICAL NOTE:  [name] is a 67 year old female who has had a chronic history of COPD and obstructive sleep apnea, had recently developed respiratory failure, admitted initially at [facility] and subsequently transferred to [facility] and was expected prolonged intubation because of severe lung disease, COPD and obstructive sleep apnea, and tracheotomy was recommended by Pulmonary Care, but they do not feel that he can safely perform a tracheotomy at bedside at the [facility] and referred her for tracheotomy down to the OR.  She was taken to the operating room at this time for a tracheotomy, she is very obese lady.  DESCRIPTION OF PROCEDURE:  After adequate endotracheal anesthesia through her already intubated endotracheal tube.  Neck was prepped with Betadine solution.  Draped in a sterile towels.  The proposed incision site was injected with 5 mL of Xylocaine with epinephrine for hemostasis.  A vertical incision was then made just above the suprasternal notch.  Dissection was carried down to the subcutaneous tissue.  Strap muscles were divided in midline.  The thyroid isthmus was identified overlying the first 3 tracheal rings.  The thyroid isthmus was divided with cautery.  Hemostasis was obtained.  The first 3 tracheal rings were identified  and a transverse tracheotomy was made between the second and third tracheal rings.  The endotracheal tube was removed and the tracheotomy measured about 2.5 to 3 cm deep to the skin incision.  It was elected to use the XLT proximal #6 Shiley trach.  This was inserted without any difficulty after removing the endotracheal tube.  The trach ventilated well and was then secured with four 2-0 silk sutures at the corners and Velcro tape around the neck.  Darely tolerated this well and subsequently transferred back to [facility].          ______________________________ Kristine Garbe. Ezzard Standing, M.D.     CEN/MEDQ  D:  12/15/2012  T:  12/16/2012  Job:  [id number]

## 2012-12-16 NOTE — Progress Notes (Signed)
PULMONARY  / CRITICAL CARE MEDICINE  Name: [name] MRN: [id number] DOB: 1945/07/03    ADMISSION DATE:  12/08/2012 CONSULTATION DATE:  12/12/12  REFERRING MD :  [facility] PRIMARY SERVICE:  [facility]  CHIEF COMPLAINT:  Acute respiratory failure  BRIEF PATIENT DESCRIPTION:  67 y/o morbidly obese female with hx polymalgia rheumatica on chronic prednisone, COPD, OSA/OHS on CPAP initially admitted to [facility] hospital with acute on chronic respiratory failure secondary to pneumonia.  She was tx to [facility] on BiPAP and cont to decline from respiratory standpoint.  She was intubated 10/31 in [facility]. PCCM consulted for vent mgmt and possible trach.   SIGNIFICANT EVENTS / STUDIES:  10/30 - Admitted to [facility] on BiPAP  10/31 - Failed BiPAP, intubated   LINES / TUBES: ETT 10/31>>>11/6  Trach (ENT) 11/6>>>  CULTURES: 11/1  Urine >>> neg  11/1  Blood >>> PTA  Sputum ([place]) >>> H Flu   ANTIBIOTICS: Rocephin (pta) >>> ??? Flagyl (pta) >>> Meropenem 11/1 >>> Vancomycin 10/30 >>>  OVERNIGHT:  RT reports pt weaning on PSV 12/5, 50%  VITAL SIGNS:  Reviewed.  PHYSICAL EXAMINATION: General:  Morbidly obese, comfortable on vent Neuro:  Awake, nonverbal, nonfocal HEENT:  OETT Cardiovascular:  Regular, no murmurs Lungs:  Bilateral diminished air entry Abdomen:  Obese, soft, bowel sounds present Musculoskeletal:  Moves all extremities  Recent Labs Lab 12/09/12 2100 12/10/12 0442  12/10/12 0500 12/11/12 0500 12/11/12 1025 12/12/12 0510 12/12/12 0620 12/13/12 0525 12/13/12 1010 12/15/12 0500 12/15/12 1800 12/16/12 0500  HGB  --   --   --  12.9 12.2  --  11.8*  --   --   --  13.1  --  12.9  WBC  --   --   --  22.5* 17.6*  --  14.0*  --   --   --  11.4*  --  12.1*  PLT  --   --   < > 362 340  --  295  --   --   --  228  --  205  NA  --   --   < > 139 141  --  142  --  143  --  145  --  141  K  --   --   < > 3.0* 3.4*  --  2.9*  --  3.0*  --  2.7* 3.4* 3.8  CL  --   --   < > 95* 101  --  99  --   99  --  97  --  97  CO2  --   --   < > 32 30  --  33*  --  34*  --  36*  --  33*  GLUCOSE  --   --   < > 262* 277*  --  354*  --  329*  --  377*  --  388*  BUN  --   --   < > 26* 32*  --  31*  --  27*  --  33*  --  32*  CREATININE  --   --   < > 0.40* 0.41*  --  0.44*  --  0.41*  --  0.47*  --  0.57  CALCIUM  --   --   < > 8.9 8.4  --  8.5  --  8.2*  --  8.9  --  8.8  MG  --   --   --   --   --   --  2.2  --   --   --   --   --   --   PHOS  --   --   --   --   --   --   --   --  2.0*  --   --   --   --   INR  --   --   --   --   --   --   --   --   --   --  1.06  --   --   PHART 7.338* 7.457*  --   --   --  7.448  --  7.530*  --  7.459*  --   --   --   PCO2ART 63.5* 47.8*  --   --   --  43.6  --  35.7  --  47.3*  --   --   --   PO2ART 138.0* 118.0*  --   --   --  115.0*  --  130.0*  --  90.4  --   --   --   HCO3 33.2* 33.3*  --   --   --  29.3*  --  29.7*  --  33.1*  --   --   --   O2SAT 98.9 99.0  --   --   --  98.2  --  99.6  --  96.7  --   --   --   < > = values in this interval not displayed. Dg Chest Port 1 View  12/15/2012   CLINICAL DATA:  Acute renal failure  EXAM: PORTABLE CHEST - 1 VIEW  COMPARISON:  12/13/2012  FINDINGS: Endotracheal tube tip is just above the carinal. Left subclavian catheter tip is in the projection of the SVC. There is an enteric tube with tip below the GE junction. The heart size is moderately enlarged. There are low lung volumes. Pulmonary edema pattern appears similar to previous exam.  IMPRESSION: 1.  Low lung volumes.  2. Pulmonary edema.   Electronically Signed   By: Signa Kell M.D.   On: 12/15/2012 08:11   Dg Abd Portable 1v  12/15/2012   CLINICAL DATA:  NG placement  EXAM: PORTABLE ABDOMEN - 1 VIEW  COMPARISON:  12/10/2012  FINDINGS: A nasogastric catheter is noted within the stomach with the tip directed towards the pylorus. A nonobstructive bowel gas pattern is seen. Some changes are noted in the bases of the lungs stable from the previous exam.    Electronically Signed   By: Alcide Clever M.D.   On: 12/15/2012 19:25    ASSESSMENT / PLAN:  Acute on chronic respiratory failure Hemophilus influenza pneumoniae Possible pulmonary edema OSA/OHS Morbid obesity Polymyalgia rheumatica DM / Hyperglycemia, likely steroids exacerbated  Hypokalemia  -->  Trach care per protocol -->  Vent weaning protocol -->  Continue Lasix for negative balance -->  Continue bronchodilators -->  Continue antibiotics as above -->  Consider tightening glycemic control -->  May need TTE -->  Mobilize / PT as able  Canary Brim, NP-C [facility] Pgr: [phone] or [phone]  12/16/2012, 11:40 AM  I have personally obtained history, examined patient, evaluated and interpreted laboratory and imaging results, reviewed medical records, formulated assessment / plan and placed orders.  [name], MD Pulmonary and Critical Care Medicine [facility] Pager: [phone]  12/16/2012, 1:08 PM

## 2012-12-16 NOTE — Progress Notes (Signed)
Echocardiogram 2D Echocardiogram has been performed.  Dorothey Baseman 12/16/2012, 2:55 PM

## 2012-12-17 ENCOUNTER — Other Ambulatory Visit (HOSPITAL_COMMUNITY): Payer: Self-pay

## 2012-12-17 LAB — CULTURE, BLOOD (ROUTINE X 2): Culture: NO GROWTH

## 2012-12-17 LAB — VANCOMYCIN, TROUGH: Vancomycin Tr: 11.6 ug/mL (ref 10.0–20.0)

## 2012-12-19 ENCOUNTER — Other Ambulatory Visit (HOSPITAL_COMMUNITY): Payer: Self-pay

## 2012-12-19 DIAGNOSIS — Z930 Tracheostomy status: Secondary | ICD-10-CM

## 2012-12-19 LAB — BLOOD GAS, ARTERIAL
Acid-Base Excess: 7.1 mmol/L — ABNORMAL HIGH (ref 0.0–2.0)
Inspiratory PAP: 10
Mode: POSITIVE
Patient temperature: 99
TCO2: 32.7 mmol/L (ref 0–100)
pH, Arterial: 7.446 (ref 7.350–7.450)

## 2012-12-19 LAB — URINE CULTURE: Colony Count: >=100000

## 2012-12-19 LAB — BASIC METABOLIC PANEL
BUN: 60 mg/dL — ABNORMAL HIGH (ref 6–23)
CO2: 31 mEq/L (ref 19–32)
Chloride: 99 mEq/L (ref 96–112)
GFR calc Af Amer: >90 mL/min (ref >90)
GFR calc non Af Amer: >90 mL/min (ref >90)
Glucose, Bld: 519 mg/dL — ABNORMAL HIGH (ref 70–99)
Potassium: 4.1 mEq/L (ref 3.5–5.1)
Sodium: 145 mEq/L (ref 135–145)

## 2012-12-19 LAB — CBC
HCT: 42.2 % (ref 36.0–46.0)
Hemoglobin: 13.4 g/dL (ref 12.0–15.0)
MCHC: 31.8 g/dL (ref 30.0–36.0)
Platelets: 198 10*3/uL (ref 150–400)
RDW: 16.6 % — ABNORMAL HIGH (ref 11.5–15.5)
WBC: 13.6 10*3/uL — ABNORMAL HIGH (ref 4.0–10.5)

## 2012-12-19 LAB — VANCOMYCIN, RANDOM: Vancomycin Rm: 26.3 ug/mL

## 2012-12-19 LAB — CLOSTRIDIUM DIFFICILE BY PCR: Toxigenic C. Difficile by PCR: NEGATIVE

## 2012-12-19 NOTE — Progress Notes (Signed)
PULMONARY  / CRITICAL CARE MEDICINE  Name: [name] MRN: [id number] DOB: [birth year]    ADMISSION DATE:  12/08/2012 CONSULTATION DATE:  12/12/12  REFERRING MD :  [facility] PRIMARY SERVICE:  [facility]  CHIEF COMPLAINT:  Acute respiratory failure  BRIEF PATIENT DESCRIPTION:  67 y/o morbidly obese female with hx polymalgia rheumatica on chronic prednisone, COPD, OSA/OHS on CPAP initially admitted to [facility] hospital with acute on chronic respiratory failure secondary to pneumonia.  She was tx to [facility] on BiPAP and cont to decline from respiratory standpoint.  She was intubated 10/31 in [facility]. PCCM consulted for vent mgmt and possible trach.   SIGNIFICANT EVENTS / STUDIES:  10/30 - Admitted to [facility] on BiPAP  10/31 - Failed BiPAP, intubated   LINES / TUBES: ETT 10/31>>>11/6  Trach (ENT) 11/6>>>  CULTURES: 11/1  Urine >>> neg  11/1  Blood >>> PTA  Sputum ([place]) >>> H Flu   ANTIBIOTICS: Rocephin (pta) >>> ??? Flagyl (pta) >>> Meropenem 11/1 >>> Vancomycin 10/30 >>>  OVERNIGHT:  RT reports pt weaning on PSV 12/5, 50%  VITAL SIGNS:  Reviewed.  PHYSICAL EXAMINATION: General:  Morbidly obese, comfortable on vent Neuro:  Awake, nonverbal, nonfocal HEENT:  Trach unremarkable  Cardiovascular:  Regular, no murmurs Lungs:  Bilateral diminished air entry Abdomen:  Obese, soft, bowel sounds present Musculoskeletal:  Moves all extremities   Recent Labs Lab 12/15/12 0500 12/15/12 1800 12/16/12 0500 12/19/12 0650  NA 145  --  141 145  K 2.7* 3.4* 3.8 4.1  CL 97  --  97 99  CO2 36*  --  33* 31  BUN 33*  --  32* 60*  CREATININE 0.47*  --  0.57 0.57  GLUCOSE 377*  --  388* 519*    Recent Labs Lab 12/15/12 0500 12/16/12 0500 12/19/12 0650  HGB 13.1 12.9 13.4  HCT 40.2 40.4 42.2  WBC 11.4* 12.1* 13.6*  PLT 228 205 198     Dg Chest Port 1 View  12/19/2012   CLINICAL DATA:  Respiratory failure.  EXAM: PORTABLE CHEST - 1 VIEW  COMPARISON:  12/17/2012.  FINDINGS: Tracheostomy,  left IJ, and NG tubes in stable position. Progressive bilateral pulmonary interstitial prominence noted consistent with interstitial edema from CHF. Progressive atelectasis versus infiltrate noted in the left lung base and right lung base. No pneumothorax. Stable degenerative changes and deformities of both shoulders.  IMPRESSION: 1. Findings consistent with progressive congestive heart failure with pulmonary interstitial edema. Superimposed pneumonia both lung bases, particularly the left cannot be excluded. These findings have progressed. 2. Stable positionings of tracheostomy, left IJ, and NG tubes. 3. No pneumothorax or significant pleural effusion.   Electronically Signed   By: Maisie Fus  Register   On: 12/19/2012 07:35    ASSESSMENT / PLAN:  Acute on chronic respiratory failure Hemophilus influenza pneumoniae Possible pulmonary edema OSA/OHS Morbid obesity Polymyalgia rheumatica DM / Hyperglycemia, likely steroids exacerbated  Hypokalemia  She appears to be tolerating weaning attempts well.  rec -->  Trach care per protocol -->  Vent weaning protocol -->  Continue Lasix for negative balance -->  Continue bronchodilators -->  Continue antibiotics as above -->  Consider tightening glycemic control -->  May need TTE -->  Mobilize / PT as able   12/19/2012, 11:00 AM  On 5/10, the patient is tolerating this relatively well for now, will continue with weaning as ordered.  Patient seen and examined, agree with above note.  I dictated the care and orders written for this  patient under my direction.  Rush Farmer, MD [phone]

## 2012-12-20 LAB — CULTURE, RESPIRATORY W GRAM STAIN

## 2012-12-20 NOTE — Progress Notes (Signed)
IR PA aware of percutaneous gastric tube request. I have spoke with the RN today. Patient has been having difficulty weaning from ventilator, tachycardic >110bpm on b-blockers. Patient also with consistent fevers 100.77F today and CXR 11/10 with progressive congestive heart failure with pulmonary interstitial edema and cannot exclude superimposed pneumonia. Patient dose have a NG tube in place now. Will continue to monitor patient's status and prefer afebrile x 48 hours and more stable from pulmonary status.   Pattricia Boss PA-C Interventional Radiology  12/20/12 4:34 PM

## 2012-12-21 ENCOUNTER — Other Ambulatory Visit (HOSPITAL_COMMUNITY): Payer: Self-pay

## 2012-12-21 DIAGNOSIS — A419 Sepsis, unspecified organism: Secondary | ICD-10-CM

## 2012-12-21 DIAGNOSIS — E119 Type 2 diabetes mellitus without complications: Secondary | ICD-10-CM

## 2012-12-21 LAB — CBC
HCT: 46.6 % — ABNORMAL HIGH (ref 36.0–46.0)
Hemoglobin: 15 g/dL (ref 12.0–15.0)
MCH: 30.4 pg (ref 26.0–34.0)
MCHC: 32.2 g/dL (ref 30.0–36.0)
MCV: 94.3 fL (ref 78.0–100.0)
Platelets: 222 10*3/uL (ref 150–400)
RDW: 17.2 % — ABNORMAL HIGH (ref 11.5–15.5)

## 2012-12-21 LAB — BASIC METABOLIC PANEL
BUN: 91 mg/dL — ABNORMAL HIGH (ref 6–23)
Calcium: 8.6 mg/dL (ref 8.4–10.5)
Chloride: 101 mEq/L (ref 96–112)
Creatinine, Ser: 0.9 mg/dL (ref 0.50–1.10)
GFR calc non Af Amer: 66 mL/min — ABNORMAL LOW (ref >90)
Glucose, Bld: 346 mg/dL — ABNORMAL HIGH (ref 70–99)

## 2012-12-21 NOTE — Progress Notes (Signed)
PULMONARY  / CRITICAL CARE MEDICINE  Name: [name] MRN: [id number] DOB: 21-Dec-1945    ADMISSION DATE:  12/08/2012 CONSULTATION DATE:  12/12/12  REFERRING MD :  [facility] PRIMARY SERVICE:  [facility]  CHIEF COMPLAINT:  Acute respiratory failure  BRIEF PATIENT DESCRIPTION:  67 y/o morbidly obese female with hx polymalgia rheumatica on chronic prednisone, COPD, OSA/OHS on CPAP initially admitted to [facility] hospital with acute on chronic respiratory failure secondary to pneumonia.  She was tx to [facility] on BiPAP and cont to decline from respiratory standpoint.  She was intubated 10/31 in [facility]. PCCM consulted for vent mgmt and possible trach.   SIGNIFICANT EVENTS / STUDIES:  10/30 - Admitted to [facility] on BiPAP  10/31 - Failed BiPAP, intubated   LINES / TUBES: ETT 10/31>>>11/6  Trach (ENT) 11/6>>>  CULTURES: 11/1  Urine >>> neg  11/1  Blood >>> PTA  Sputum ([place]) >>> H Flu   ANTIBIOTICS: Rocephin (pta) >>> ??? Flagyl (pta) >>> Meropenem 11/1 >>> Vancomycin 10/30 >>>  OVERNIGHT:  RT reports pt weaning on PSV 12/5, 50%  VITAL SIGNS:  Reviewed.  PHYSICAL EXAMINATION: General: Morbidly obese, comfortable on vent Neuro: Awake, nonverbal, nonfocal HEENT: Trach unremarkable  Cardiovascular:  Regular, no murmurs Lungs:  Bilateral diminished air entry Abdomen:  Obese, soft, bowel sounds present Musculoskeletal:  Moves all extremities   Recent Labs Lab 12/16/12 0500 12/19/12 0650 12/21/12 0655  NA 141 145 149*  K 3.8 4.1 4.6  CL 97 99 101  CO2 33* 31 29  BUN 32* 60* 91*  CREATININE 0.57 0.57 0.90  GLUCOSE 388* 519* 346*    Recent Labs Lab 12/16/12 0500 12/19/12 0650 12/21/12 0655  HGB 12.9 13.4 15.0  HCT 40.4 42.2 46.6*  WBC 12.1* 13.6* 14.4*  PLT 205 198 222   Dg Chest Port 1 View  12/21/2012   CLINICAL DATA:  CHF.  EXAM: PORTABLE CHEST - 1 VIEW  COMPARISON:  December 19, 2012.  FINDINGS: The lungs are mildly hypoinflated. The cardiac silhouette remains enlarged but is  better defined today. They pulmonary interstitial markings are less engorged. The tracheostomy tube tip lies at the level of the inferior margin of the clavicular heads approximately 3.7 cm above the carina. Esophagogastric tube tip in proximal port project off the film. A left internal jugular venous catheter tip lies in the region of the junction of the right and left brachiocephalic veins.  IMPRESSION: There has been improvement in the appearance of the chest since November 10th with decreasing interstitial edema. The left hemidiaphragm is better demonstrated as well.   Electronically Signed   By: David  [place]   On: 12/21/2012 08:24   ASSESSMENT / PLAN:  Acute on chronic respiratory failure Hemophilus influenza pneumoniae Possible pulmonary edema OSA/OHS Morbid obesity Polymyalgia rheumatica DM / Hyperglycemia, likely steroids exacerbated  Hypokalemia  She appears to be tolerating weaning attempts well.  rec -->  Trach care per protocol -->  Hold weaning as patient is currently actively septic. -->  Hold diureses as patient is tachy and with marginal BP. -->  Continue bronchodilators. -->  Continue antibiotics as ordered. -->  Tighten glycemic control with higher ISS. -->  Need TTE for ?endocarditis. -->  Reculture.  12/21/2012, 10:28 AM  [name], M.D. [facility] Pulmonary/Critical Care Medicine. Pager: [phone]. After hours pager: [phone].

## 2012-12-22 ENCOUNTER — Other Ambulatory Visit (HOSPITAL_COMMUNITY): Payer: Self-pay

## 2012-12-22 LAB — BASIC METABOLIC PANEL
BUN: 86 mg/dL — ABNORMAL HIGH (ref 6–23)
Calcium: 8.3 mg/dL — ABNORMAL LOW (ref 8.4–10.5)
Chloride: 100 mEq/L (ref 96–112)
Creatinine, Ser: 0.78 mg/dL (ref 0.50–1.10)
GFR calc Af Amer: >90 mL/min (ref >90)
GFR calc non Af Amer: 86 mL/min — ABNORMAL LOW (ref >90)
Glucose, Bld: 348 mg/dL — ABNORMAL HIGH (ref 70–99)
Potassium: 4.8 mEq/L (ref 3.5–5.1)

## 2012-12-22 NOTE — Progress Notes (Signed)
PULMONARY  / CRITICAL CARE MEDICINE  Name: GWYNNETH FABIO MRN: [id number] DOB: February 24, 1945    ADMISSION DATE:  12/08/2012 CONSULTATION DATE:  12/12/12  REFERRING MD :  [facility] PRIMARY SERVICE:  [facility]  CHIEF COMPLAINT:  Acute respiratory failure  BRIEF PATIENT DESCRIPTION:  67 y/o morbidly obese female with hx polymalgia rheumatica on chronic prednisone, COPD, OSA/OHS on CPAP initially admitted to [facility] hospital with acute on chronic respiratory failure secondary to pneumonia.  She was tx to [facility] on BiPAP and cont to decline from respiratory standpoint.  She was intubated 10/31 in [facility]. PCCM consulted for vent mgmt and possible trach.   SIGNIFICANT EVENTS / STUDIES:  10/30 - Admitted to [facility] on BiPAP  10/31 - Failed BiPAP, intubated   LINES / TUBES: ETT 10/31>>>11/6  Trach (ENT) 11/6>>>  CULTURES: 11/1  Urine >>> neg  11/1  Blood >>> PTA  Sputum ([place]) >>> H Flu   ANTIBIOTICS: Rocephin (pta) >>> ??? Flagyl (pta) >>> Meropenem 11/1 >>> Vancomycin 10/30 >>>  OVERNIGHT:  RT reports pt weaning on PSV 12/5, 50%  VITAL SIGNS:  Reviewed.  PHYSICAL EXAMINATION: General: Morbidly obese, comfortable on vent Neuro: Awake, nonverbal, nonfocal HEENT: Trach unremarkable  Cardiovascular: Regular, no murmurs Lungs: Bilateral diminished air entry Abdomen: Obese, soft, bowel sounds present Musculoskeletal:  Moves all extremities  Recent Labs Lab 12/19/12 0650 12/21/12 0655 12/22/12 0401  NA 145 149* 146*  K 4.1 4.6 4.8  CL 99 101 100  CO2 31 29 26   BUN 60* 91* 86*  CREATININE 0.57 0.90 0.78  GLUCOSE 519* 346* 348*   Recent Labs Lab 12/16/12 0500 12/19/12 0650 12/21/12 0655  HGB 12.9 13.4 15.0  HCT 40.4 42.2 46.6*  WBC 12.1* 13.6* 14.4*  PLT 205 198 222   Dg Chest Port 1 View  12/21/2012   CLINICAL DATA:  CHF.  EXAM: PORTABLE CHEST - 1 VIEW  COMPARISON:  December 19, 2012.  FINDINGS: The lungs are mildly hypoinflated. The cardiac silhouette remains enlarged but is better  defined today. They pulmonary interstitial markings are less engorged. The tracheostomy tube tip lies at the level of the inferior margin of the clavicular heads approximately 3.7 cm above the carina. Esophagogastric tube tip in proximal port project off the film. A left internal jugular venous catheter tip lies in the region of the junction of the right and left brachiocephalic veins.  IMPRESSION: There has been improvement in the appearance of the chest since November 10th with decreasing interstitial edema. The left hemidiaphragm is better demonstrated as well.   Electronically Signed   By: David  [place]   On: 12/21/2012 08:24   ASSESSMENT / PLAN:  Acute on chronic respiratory failure Hemophilus influenza pneumoniae Possible pulmonary edema OSA/OHS Morbid obesity Polymyalgia rheumatica DM / Hyperglycemia, likely steroids exacerbated  Hypokalemia  She appears to be tolerating weaning attempts well.  rec -->  Trach care per protocol -->  Hold weaning as patient is currently actively septic, will likely need vent SNF. -->  Hold diureses as patient is tachy and with marginal BP, recommend hydration. -->  Continue bronchodilators. -->  Continue antibiotics as ordered. -->  Tighten glycemic control with higher ISS. -->  Need TTE for ?endocarditis. -->  Reculture.  12/22/2012, 11:03 AM  [name], M.D. [facility] Pulmonary/Critical Care Medicine. Pager: [phone]. After hours pager: [phone].

## 2012-12-23 LAB — BLOOD GAS, ARTERIAL
Bicarbonate: 27.2 mEq/L — ABNORMAL HIGH (ref 20.0–24.0)
MECHVT: 450 mL
O2 Saturation: 97.6 %
O2 Saturation: 97.5 %
PEEP: 5 cmH2O
Patient temperature: 98.6
RATE: 12 resp/min
TCO2: 28.2 mmol/L (ref 0–100)
TCO2: 23.9 mmol/L (ref 0–100)
pCO2 arterial: 31 mmHg — ABNORMAL LOW (ref 35.0–45.0)
pCO2 arterial: 32.8 mmHg — ABNORMAL LOW (ref 35.0–45.0)
pH, Arterial: 7.553 — ABNORMAL HIGH (ref 7.350–7.450)
pO2, Arterial: 96 mmHg (ref 80.0–100.0)

## 2012-12-23 LAB — CBC WITH DIFFERENTIAL/PLATELET
Basophils Absolute: 0.1 10*3/uL (ref 0.0–0.1)
Eosinophils Absolute: 0 10*3/uL (ref 0.0–0.7)
HCT: 46.3 % — ABNORMAL HIGH (ref 36.0–46.0)
Lymphocytes Relative: 16 % (ref 12–46)
Lymphs Abs: 2 10*3/uL (ref 0.7–4.0)
MCHC: 32.8 g/dL (ref 30.0–36.0)
Neutro Abs: 9.9 10*3/uL — ABNORMAL HIGH (ref 1.7–7.7)
Platelets: 188 10*3/uL (ref 150–400)
RDW: 16.8 % — ABNORMAL HIGH (ref 11.5–15.5)
WBC: 12.6 10*3/uL — ABNORMAL HIGH (ref 4.0–10.5)

## 2012-12-23 LAB — CBC
HCT: 45.7 % (ref 36.0–46.0)
Hemoglobin: 15 g/dL (ref 12.0–15.0)
MCH: 30.5 pg (ref 26.0–34.0)
MCHC: 32.8 g/dL (ref 30.0–36.0)
MCV: 93.1 fL (ref 78.0–100.0)

## 2012-12-23 LAB — BASIC METABOLIC PANEL
BUN: 91 mg/dL — ABNORMAL HIGH (ref 6–23)
BUN: 99 mg/dL — ABNORMAL HIGH (ref 6–23)
Calcium: 8.1 mg/dL — ABNORMAL LOW (ref 8.4–10.5)
Calcium: 8.1 mg/dL — ABNORMAL LOW (ref 8.4–10.5)
Chloride: 104 mEq/L (ref 96–112)
Chloride: 99 mEq/L (ref 96–112)
Creatinine, Ser: 0.85 mg/dL (ref 0.50–1.10)
GFR calc Af Amer: 82 mL/min — ABNORMAL LOW (ref >90)
GFR calc non Af Amer: 56 mL/min — ABNORMAL LOW (ref >90)
Glucose, Bld: 249 mg/dL — ABNORMAL HIGH (ref 70–99)
Glucose, Bld: 433 mg/dL — ABNORMAL HIGH (ref 70–99)
Sodium: 150 mEq/L — ABNORMAL HIGH (ref 135–145)

## 2012-12-24 LAB — CULTURE, BLOOD (ROUTINE X 2): Culture: NO GROWTH

## 2012-12-24 LAB — URINE CULTURE

## 2012-12-25 LAB — VANCOMYCIN, TROUGH: Vancomycin Tr: 35.2 ug/mL (ref 10.0–20.0)

## 2012-12-25 LAB — BASIC METABOLIC PANEL
BUN: 86 mg/dL — ABNORMAL HIGH (ref 6–23)
CO2: 19 mEq/L (ref 19–32)
Calcium: 8.4 mg/dL (ref 8.4–10.5)
Creatinine, Ser: 0.81 mg/dL (ref 0.50–1.10)
GFR calc Af Amer: 86 mL/min — ABNORMAL LOW (ref >90)
GFR calc non Af Amer: 75 mL/min — ABNORMAL LOW (ref >90)
Potassium: 4.8 mEq/L (ref 3.5–5.1)

## 2012-12-25 LAB — CBC
HCT: 40.3 % (ref 36.0–46.0)
Hemoglobin: 13.1 g/dL (ref 12.0–15.0)
MCH: 30.6 pg (ref 26.0–34.0)
MCHC: 32.5 g/dL (ref 30.0–36.0)
Platelets: 178 10*3/uL (ref 150–400)
RBC: 4.28 MIL/uL (ref 3.87–5.11)
RDW: 18.2 % — ABNORMAL HIGH (ref 11.5–15.5)

## 2012-12-26 DIAGNOSIS — M353 Polymyalgia rheumatica: Secondary | ICD-10-CM

## 2012-12-26 LAB — BLOOD GAS, ARTERIAL
Acid-base deficit: 2.2 mmol/L — ABNORMAL HIGH (ref 0.0–2.0)
Bicarbonate: 21.2 mEq/L (ref 20.0–24.0)
FIO2: 30 %
O2 Saturation: 99.6 %
Patient temperature: 98.6
TCO2: 22.1 mmol/L (ref 0–100)
pH, Arterial: 7.453 — ABNORMAL HIGH (ref 7.350–7.450)
pO2, Arterial: 136 mmHg — ABNORMAL HIGH (ref 80.0–100.0)

## 2012-12-26 LAB — CBC
HCT: 37.3 % (ref 36.0–46.0)
MCH: 31.2 pg (ref 26.0–34.0)
MCHC: 32.7 g/dL (ref 30.0–36.0)
MCV: 95.4 fL (ref 78.0–100.0)
Platelets: 193 10*3/uL (ref 150–400)
RDW: 19.3 % — ABNORMAL HIGH (ref 11.5–15.5)
WBC: 11.8 10*3/uL — ABNORMAL HIGH (ref 4.0–10.5)

## 2012-12-26 LAB — BASIC METABOLIC PANEL
BUN: 98 mg/dL — ABNORMAL HIGH (ref 6–23)
CO2: 22 mEq/L (ref 19–32)
Chloride: 103 mEq/L (ref 96–112)
Creatinine, Ser: 1.25 mg/dL — ABNORMAL HIGH (ref 0.50–1.10)
GFR calc Af Amer: 51 mL/min — ABNORMAL LOW (ref >90)
Glucose, Bld: 342 mg/dL — ABNORMAL HIGH (ref 70–99)
Potassium: 4.6 mEq/L (ref 3.5–5.1)

## 2012-12-26 NOTE — Progress Notes (Signed)
PULMONARY  / CRITICAL CARE MEDICINE  Name: [name] MRN: [id number] DOB: September 14, 1945    ADMISSION DATE:  12/08/2012 CONSULTATION DATE:  12/12/12  REFERRING MD :  [facility] PRIMARY SERVICE:  [facility]  CHIEF COMPLAINT:  Acute respiratory failure  BRIEF PATIENT DESCRIPTION:  67 y/o morbidly obese female with hx polymalgia rheumatica on chronic prednisone, COPD, OSA/OHS on CPAP initially admitted to [facility] hospital with acute on chronic respiratory failure secondary to pneumonia.  She was tx to [facility] on BiPAP and cont to decline from respiratory standpoint.  She was intubated 10/31 in [facility]. PCCM consulted for vent mgmt and possible trach.   SIGNIFICANT EVENTS / STUDIES:  10/30 - Admitted to [facility] on BiPAP  10/31 - Failed BiPAP, intubated   LINES / TUBES: ETT 10/31>>>11/6  Trach (ENT) 11/6>>>  CULTURES: 11/1  Urine >>> neg  11/1  Blood >>>neg  PTA  Sputum ([place]) >>> H Flu   ANTIBIOTICS: Rocephin (pta) >>> ??? Flagyl (pta) >>> Meropenem 11/1 >>> Vancomycin 10/30 >>>  OVERNIGHT: no distress   VITAL SIGNS:  Reviewed.  PHYSICAL EXAMINATION: General: Morbidly obese, comfortable weaning on vent  Neuro: Awake, nonverbal, nonfocal HEENT: Trach unremarkable  Cardiovascular: Regular, no murmurs Lungs: Bilateral diminished air entry Abdomen: Obese, soft, bowel sounds present Musculoskeletal:  Moves all extremities  Recent Labs Lab 12/23/12 1540 12/25/12 0619 12/26/12 1045  NA 142 145 138  K 4.7 4.8 4.6  CL 99 108 103  CO2 24 19 22   BUN 99* 86* 98*  CREATININE 1.02 0.81 1.25*  GLUCOSE 433* 203* 342*    Recent Labs Lab 12/23/12 1540 12/25/12 0619 12/26/12 0750  HGB 15.0 13.1 12.2  HCT 45.7 40.3 37.3  WBC 13.3* 11.9* 11.8*  PLT 180 178 193   No results found. ASSESSMENT / PLAN:  Acute on chronic respiratory failure in setting of Hemophilus influenza pneumoniae Possible pulmonary edema and chronic OSA/OHS  >She appears to be tolerating weaning attempts well but given  her body habitus suspect that in best case scenario we are looking at day time trach/ nocturnal vent.  Plan: -cont psv as tolerated, work towards day time ATC.  - think she will need vent/snf  UTI  11/8 had 100K yeast, new culture sensitivities are pending Plan:  Per primary   Polymyalgia rheumatica, DM / Hyperglycemia, likely steroids exacerbated  Plan: Per primary service      12/26/2012, 12:04 PM  Attending:  I have seen and examined the patient with nurse practitioner/resident and agree with the note above.   Yolonda Kida PCCM Pager: [phone] Cell: [phone] If no response, call [phone]

## 2012-12-27 ENCOUNTER — Other Ambulatory Visit (HOSPITAL_COMMUNITY): Payer: Self-pay

## 2012-12-28 LAB — BASIC METABOLIC PANEL
BUN: 130 mg/dL — ABNORMAL HIGH (ref 6–23)
CO2: 19 mEq/L (ref 19–32)
Calcium: 8.5 mg/dL (ref 8.4–10.5)
Chloride: 104 mEq/L (ref 96–112)
GFR calc non Af Amer: 25 mL/min — ABNORMAL LOW (ref >90)
Glucose, Bld: 201 mg/dL — ABNORMAL HIGH (ref 70–99)
Potassium: 4.9 mEq/L (ref 3.5–5.1)
Sodium: 140 mEq/L (ref 135–145)

## 2012-12-28 LAB — CBC
HCT: 33.2 % — ABNORMAL LOW (ref 36.0–46.0)
Hemoglobin: 10.5 g/dL — ABNORMAL LOW (ref 12.0–15.0)
MCH: 30.4 pg (ref 26.0–34.0)
MCHC: 31.6 g/dL (ref 30.0–36.0)
MCV: 96.2 fL (ref 78.0–100.0)
RBC: 3.45 MIL/uL — ABNORMAL LOW (ref 3.87–5.11)

## 2012-12-29 ENCOUNTER — Encounter: Payer: Self-pay | Admitting: Radiology

## 2012-12-29 LAB — BASIC METABOLIC PANEL
BUN: 150 mg/dL — ABNORMAL HIGH (ref 6–23)
CO2: 19 mEq/L (ref 19–32)
Chloride: 103 mEq/L (ref 96–112)
Creatinine, Ser: 2.3 mg/dL — ABNORMAL HIGH (ref 0.50–1.10)
GFR calc Af Amer: 24 mL/min — ABNORMAL LOW (ref >90)
Glucose, Bld: 255 mg/dL — ABNORMAL HIGH (ref 70–99)
Potassium: 5.3 mEq/L — ABNORMAL HIGH (ref 3.5–5.1)

## 2012-12-29 LAB — CULTURE, BLOOD (ROUTINE X 2): Culture: NO GROWTH

## 2012-12-29 NOTE — H&P (Signed)
[name] is an 67 y.o. female.   Chief Complaint: Scheduled for percutaneous gastric tube placement in IR Pt with long hx of CHF with recent exacerbation - respiratory failure--intubated on vent then transferred to [facility] and now on trach. UTI + yeast- treated  BC neg afeb x 48 hrs 98.6; HR 98; 14/70 Need for long term care and nutritional support HPI: CHF; COPD; OSA; polymyalgia rheumatica; Cirrhosis  Past Medical History  Diagnosis Date  . Fibromyalgia   . Osteoarthritis   . Asthma   . Type II or unspecified type diabetes mellitus without mention of complication, not stated as uncontrolled   . Esophageal reflux   . Allergic rhinitis, cause unspecified   . OSA on CPAP   . CAD (coronary artery disease)   . Other and unspecified hyperlipidemia     Past Surgical History  Procedure Laterality Date  . Vesicovaginal fistula closure w/ tah    . Tonsillectomy    . Eye surgery    . Tracheostomy tube placement N/A 12/15/2012    Procedure: TRACHEOSTOMY;  Surgeon: Drema Halon, MD;  Location: [facility] OR;  Service: ENT;  Laterality: N/A;    Family History  Problem Relation Age of Onset  . Pancreatic cancer Brother   . Lung cancer Sister   . Colon cancer Maternal Grandfather   . Diabetes Father    Social History:  reports that she quit smoking about 20 years ago. Her smoking use included Cigarettes. She has a 12 pack-year smoking history. She has never used smokeless tobacco. She reports that she does not drink alcohol. Her drug history is not on file.  Allergies:  Allergies  Allergen Reactions  . Codeine     REACTION: hallucinations  . Levofloxacin     REACTION: sores  . Morphine     REACTION: paralysis  . Pregabalin   . Sodium Benzoate     REACTION: unspecified  . Sulfonamide Derivatives     REACTION: anaphylactic    Medications Prior to Admission  Medication Sig Dispense Refill  . albuterol (PROAIR HFA) 108 (90 BASE) MCG/ACT inhaler Inhale 2 puffs into the  lungs every 6 (six) hours as needed.        Marland Kitchen albuterol (PROVENTIL) (2.5 MG/3ML) 0.083% nebulizer solution Take 3 mLs (2.5 mg total) by nebulization every 6 (six) hours as needed.  360 mL  2  . atorvastatin (LIPITOR) 20 MG tablet Take 20 mg by mouth daily.        . calcium carbonate (TUMS - DOSED IN MG ELEMENTAL CALCIUM) 500 MG chewable tablet Chew 1 tablet by mouth daily.        . [employer] INSULIN SYRINGE .5CC/30G 30G X 1/2" 0.5 ML MISC USE AS DIRECTED  100 each  11  . diazepam (VALIUM) 10 MG tablet Take 10 mg by mouth every 12 (twelve) hours as needed.        . dorzolamide (TRUSOPT) 2 % ophthalmic solution Place 1 drop into the right eye 2 (two) times daily.      . fentaNYL (DURAGESIC - DOSED MCG/HR) 100 MCG/HR Place 1 patch onto the skin every 3 (three) days.        . fluconazole (DIFLUCAN) 100 MG tablet Take 100 mg by mouth once a week.        . furosemide (LASIX) 80 MG tablet Take 80 mg by mouth daily.        Marland Kitchen ibandronate (BONIVA) 3 MG/3ML SOLN injection Inject 3 mg into the vein  once.        . insulin glargine (LANTUS) 100 UNIT/ML injection Inject 40 Units into the skin at bedtime.       . insulin lispro (HUMALOG) 100 UNIT/ML injection Inject 100 Units into the skin QID. Sliding scale      . INSULIN SYRINGE 1CC/29G (B-D INSULIN SYRINGE) 29G X 1/2" 1 ML MISC by Does not apply route.        Marland Kitchen ketoconazole (NIZORAL) 2 % cream Apply 1 application topically 3 (three) times daily.        Marland Kitchen latanoprost (XALATAN) 0.005 % ophthalmic solution Place 1 drop into both eyes at bedtime.      Marland Kitchen levothyroxine (SYNTHROID, LEVOTHROID) 50 MCG tablet Take 50 mcg by mouth daily.        . meclizine (ANTIVERT) 25 MG tablet Take 25 mg by mouth 3 (three) times daily as needed.        . nizatidine (AXID) 150 MG capsule Take 300 mg by mouth 2 (two) times daily.        Marland Kitchen nystatin (MYCOSTATIN) 100000 UNIT/ML suspension Take 500,000 Units by mouth 4 (four) times daily.      Marland Kitchen oxyCODONE-acetaminophen (PERCOCET) 10-650 MG per  tablet Take 1 tablet by mouth every 6 (six) hours as needed.        . pantoprazole (PROTONIX) 40 MG tablet 2 tablets twice a day       . predniSONE (DELTASONE) 10 MG tablet Take 10 mg by mouth daily.        . promethazine (PHENERGAN) 25 MG tablet Take 25 mg by mouth every 8 (eight) hours as needed.          Results for orders placed during the hospital encounter of 12/08/12 (from the past 48 hour(s))  CBC     Status: Abnormal   Collection Time    12/28/12  5:45 AM      Result Value Range   WBC 11.5 (*) 4.0 - 10.5 K/uL   RBC 3.45 (*) 3.87 - 5.11 MIL/uL   Hemoglobin 10.5 (*) 12.0 - 15.0 g/dL   HCT 16.1 (*) 09.6 - 04.5 %   MCV 96.2  78.0 - 100.0 fL   MCH 30.4  26.0 - 34.0 pg   MCHC 31.6  30.0 - 36.0 g/dL   RDW 40.9 (*) 81.1 - 91.4 %   Platelets 167  150 - 400 K/uL  BASIC METABOLIC PANEL     Status: Abnormal   Collection Time    12/28/12  5:45 AM      Result Value Range   Sodium 140  135 - 145 mEq/L   Potassium 4.9  3.5 - 5.1 mEq/L   Comment: HEMOLYSIS AT THIS LEVEL MAY AFFECT RESULT   Chloride 104  96 - 112 mEq/L   CO2 19  19 - 32 mEq/L   Glucose, Bld 201 (*) 70 - 99 mg/dL   Comment: LIPEMIC SPECIMEN   BUN 130 (*) 6 - 23 mg/dL   Creatinine, Ser 7.82 (*) 0.50 - 1.10 mg/dL   Comment: DELTA CHECK NOTED   Calcium 8.5  8.4 - 10.5 mg/dL   GFR calc non Af Amer 25 (*) >90 mL/min   GFR calc Af Amer 29 (*) >90 mL/min   Comment: (NOTE)     The eGFR has been calculated using the CKD EPI equation.     This calculation has not been validated in all clinical situations.     eGFR's persistently <90 mL/min signify possible  Chronic Kidney     Disease.  BASIC METABOLIC PANEL     Status: Abnormal   Collection Time    12/29/12  5:40 AM      Result Value Range   Sodium 138  135 - 145 mEq/L   Potassium 5.3 (*) 3.5 - 5.1 mEq/L   Chloride 103  96 - 112 mEq/L   CO2 19  19 - 32 mEq/L   Glucose, Bld 255 (*) 70 - 99 mg/dL   BUN 161 (*) 6 - 23 mg/dL   Creatinine, Ser 0.96 (*) 0.50 - 1.10 mg/dL    Calcium 8.6  8.4 - 04.5 mg/dL   GFR calc non Af Amer 21 (*) >90 mL/min   GFR calc Af Amer 24 (*) >90 mL/min   Comment: (NOTE)     The eGFR has been calculated using the CKD EPI equation.     This calculation has not been validated in all clinical situations.     eGFR's persistently <90 mL/min signify possible Chronic Kidney     Disease.  PRO B NATRIURETIC PEPTIDE     Status: Abnormal   Collection Time    12/29/12  5:40 AM      Result Value Range   Pro B Natriuretic peptide (BNP) 1781.0 (*) 0 - 125 pg/mL   Ct Chest Wo Contrast  12/27/2012   CLINICAL DATA:  Ventilated patient. Evaluate for potential pneumonia.  EXAM: CT CHEST WITHOUT CONTRAST  TECHNIQUE: Multidetector CT imaging of the chest was performed following the standard protocol without IV contrast.  COMPARISON:  Chest CT 03/04/2010.  FINDINGS: Mediastinum: Tracheostomy tube in position with tip in the trachea at the level of the aortic arch. Left upper extremity PICC with tip terminating in the right atrium. Nasogastric tube extending into the stomach. Heart size is normal. There is no significant pericardial fluid, thickening or pericardial calcification. No pathologically enlarged mediastinal or hilar lymph nodes. Please note that accurate exclusion of hilar adenopathy is limited on noncontrast CT scans. Esophagus is unremarkable in appearance.  Lungs/Pleura: Study is limited by considerable patient respiratory motion. With these limitations in mind, there is no definite acute consolidative airspace disease. Extensive bronchial wall thickening is noted, most pronounced within the lower lobes of the lungs bilaterally, where there is also some very mild cylindrical bronchiectasis. Focal architectural distortion in the inferior segment of the lingula likely represents subsegmental atelectasis or scarring. 8 mm subpleural nodule in the periphery of the right upper lobe (image 26 of series 3). No definite pleural effusions.  Upper Abdomen:  Unremarkable.  Musculoskeletal: Numerous compression fractures (T5, T6, T7, T8, T10, T11 and T12) are noted. The majority of these appear similar to the prior study, although there appears to have been increased compression of T12 with a new fracture through the inferior endplate and approximately 30% loss of anterior vertebral body height.  IMPRESSION: 1. No definite evidence of pneumonia at this time. 2. The appearance of lung bases does suggest sequela of recurrent aspiration (without frank aspiration pneumonitis or pneumonia at this time), based on bronchial wall thickening and mild cylindrical bronchiectasis in the lower lobes of lungs bilaterally. 3. 8 mm subpleural nodule in the periphery of the right upper lobe. If the patient is at high risk for bronchogenic carcinoma, follow-up chest CT at 3-48months is recommended. If the patient is at low risk for bronchogenic carcinoma, follow-up chest CT at 6-12 months is recommended. This recommendation follows the consensus statement: Guidelines for Management of Small Pulmonary Nodules  Detected on CT Scans: A Statement from the Fleischner Society as published in Radiology 2005; 237:395-400. 4. Support apparatus, as above. 5. Multiple thoracic vertebral body compression fractures redemonstrated, with increased compression of T12 compared to the prior study, as above   Electronically Signed   By: Trudie Reed M.D.   On: 12/27/2012 18:50   Ct Maxillofacial Wo Cm  12/27/2012   CLINICAL DATA:  Rule out sinusitis  EXAM: CT MAXILLOFACIAL WITHOUT CONTRAST  TECHNIQUE: Multidetector CT imaging of the maxillofacial structures was performed. Multiplanar CT image reconstructions were also generated. A small metallic BB was placed on the right temple in order to reliably differentiate right from left.  COMPARISON:  None.  FINDINGS: NG tube passes through the left nares into the pharynx. Patient also has a tracheostomy which is not imaged.  Prior sinus surgery with medial  antrectomy bilaterally. Partial ethmoidectomy bilaterally.  Mild mucosal edema in the maxillary sinus bilaterally. Mucosal edema also present in the sphenoid sinus. Probable air-fluid levels also present in the sphenoid sinus bilaterally.  Mastoid sinus clear bilaterally.  IMPRESSION: Prior sinus surgery. Mucosal edema in the maxillary sinus bilaterally. Air-fluid levels in the sphenoid sinus.   Electronically Signed   By: Marlan Palau M.D.   On: 12/27/2012 19:51    Review of Systems  Constitutional: Negative for fever.  Respiratory: Positive for sputum production and shortness of breath.   Neurological: Positive for weakness.    98.6; HR 98; 140/70; R 25  Physical Exam  Cardiovascular: Normal rate, regular rhythm and normal heart sounds.   Respiratory: She is in respiratory distress. She has wheezes.  trach  GI: Soft. Bowel sounds are normal. She exhibits distension.  Musculoskeletal:  Non responsive  Skin: Skin is warm.  Psychiatric:  Consented husband over phone      Assessment/Plan Need long term care; on trach Hx CHF; COPD Protein calorie malnutrition Need for long term care Scheduled for G tube in IR pts husband aware of procedure benefits and risks and agreeable to proceed Consent signed and in chart via phone Ancef ordered Barium ordered- check kub Lovenox held  [facility] A 12/29/2012, 11:14 AM

## 2012-12-30 ENCOUNTER — Other Ambulatory Visit (HOSPITAL_COMMUNITY): Payer: Self-pay

## 2012-12-30 LAB — BASIC METABOLIC PANEL
BUN: 154 mg/dL — ABNORMAL HIGH (ref 6–23)
CO2: 18 mEq/L — ABNORMAL LOW (ref 19–32)
Chloride: 104 mEq/L (ref 96–112)
GFR calc non Af Amer: 22 mL/min — ABNORMAL LOW (ref >90)
Glucose, Bld: 234 mg/dL — ABNORMAL HIGH (ref 70–99)
Potassium: 4.2 mEq/L (ref 3.5–5.1)
Sodium: 140 mEq/L (ref 135–145)

## 2012-12-30 LAB — CBC
HCT: 28 % — ABNORMAL LOW (ref 36.0–46.0)
Hemoglobin: 9 g/dL — ABNORMAL LOW (ref 12.0–15.0)
MCH: 30.7 pg (ref 26.0–34.0)
MCHC: 32.1 g/dL (ref 30.0–36.0)
MCV: 95.6 fL (ref 78.0–100.0)
Platelets: 173 10*3/uL (ref 150–400)
RBC: 2.93 MIL/uL — ABNORMAL LOW (ref 3.87–5.11)
WBC: 8.8 10*3/uL (ref 4.0–10.5)

## 2012-12-30 MED ORDER — FENTANYL CITRATE 0.05 MG/ML IJ SOLN
INTRAMUSCULAR | Status: AC
  Filled 2012-12-30: qty 2

## 2012-12-30 MED ORDER — MIDAZOLAM HCL 2 MG/2ML IJ SOLN
INTRAMUSCULAR | Status: AC
  Filled 2012-12-30: qty 4

## 2013-01-01 ENCOUNTER — Other Ambulatory Visit (HOSPITAL_COMMUNITY): Payer: Self-pay

## 2013-01-01 LAB — URINALYSIS, ROUTINE W REFLEX MICROSCOPIC
Bilirubin Urine: NEGATIVE
Glucose, UA: 100 mg/dL — AB
Ketones, ur: 15 mg/dL — AB
Protein, ur: 100 mg/dL — AB

## 2013-01-01 LAB — URINE MICROSCOPIC-ADD ON

## 2013-01-01 LAB — BLOOD GAS, ARTERIAL
Acid-base deficit: 7.5 mmol/L — ABNORMAL HIGH (ref 0.0–2.0)
Bicarbonate: 17.8 mEq/L — ABNORMAL LOW (ref 20.0–24.0)
FIO2: 0.28 %
TCO2: 18.9 mmol/L (ref 0–100)
pCO2 arterial: 37.8 mmHg (ref 35.0–45.0)
pH, Arterial: 7.294 — ABNORMAL LOW (ref 7.350–7.450)

## 2013-01-02 ENCOUNTER — Other Ambulatory Visit (HOSPITAL_COMMUNITY): Payer: Self-pay

## 2013-01-02 DIAGNOSIS — Z930 Tracheostomy status: Secondary | ICD-10-CM

## 2013-01-02 LAB — CBC
HCT: 24.8 % — ABNORMAL LOW (ref 36.0–46.0)
MCHC: 32.7 g/dL (ref 30.0–36.0)
MCV: 95.4 fL (ref 78.0–100.0)
Platelets: 185 10*3/uL (ref 150–400)
RDW: 20.7 % — ABNORMAL HIGH (ref 11.5–15.5)
WBC: 8 10*3/uL (ref 4.0–10.5)

## 2013-01-02 LAB — BASIC METABOLIC PANEL
BUN: 172 mg/dL — ABNORMAL HIGH (ref 6–23)
Chloride: 107 mEq/L (ref 96–112)
Creatinine, Ser: 1.55 mg/dL — ABNORMAL HIGH (ref 0.50–1.10)
GFR calc Af Amer: 39 mL/min — ABNORMAL LOW (ref >90)
Sodium: 143 mEq/L (ref 135–145)

## 2013-01-02 LAB — GRAM STAIN

## 2013-01-02 MED ORDER — MIDAZOLAM HCL 2 MG/2ML IJ SOLN
INTRAMUSCULAR | Status: AC
  Filled 2013-01-02: qty 6

## 2013-01-02 MED ORDER — GLUCAGON HCL (RDNA) 1 MG IJ SOLR
INTRAMUSCULAR | Status: AC | PRN
  Administered 2013-01-02: 1 mg via INTRAVENOUS

## 2013-01-02 MED ORDER — IOHEXOL 300 MG/ML  SOLN
50.0000 mL | Freq: Once | INTRAMUSCULAR | Status: AC | PRN
  Administered 2013-01-02: 1 mL

## 2013-01-02 MED ORDER — MIDAZOLAM HCL 2 MG/2ML IJ SOLN
INTRAMUSCULAR | Status: AC | PRN
  Administered 2013-01-02: 1 mg via INTRAVENOUS

## 2013-01-02 MED ORDER — GLUCAGON HCL (RDNA) 1 MG IJ SOLR
INTRAMUSCULAR | Status: AC
  Filled 2013-01-02: qty 1

## 2013-01-02 MED ORDER — FENTANYL CITRATE 0.05 MG/ML IJ SOLN
INTRAMUSCULAR | Status: AC
  Filled 2013-01-02: qty 4

## 2013-01-02 NOTE — Procedures (Signed)
22F gastrostomy tube placement under fluoro No complication No blood loss. See complete dictation in [facility] PACS.  

## 2013-01-02 NOTE — Progress Notes (Signed)
PULMONARY  / CRITICAL CARE MEDICINE  Name: [name] MRN: [id number] DOB: [birth year]    ADMISSION DATE:  12/08/2012 CONSULTATION DATE:  12/12/12  REFERRING MD :  [facility] PRIMARY SERVICE:  [facility]  CHIEF COMPLAINT:  Acute respiratory failure  BRIEF PATIENT DESCRIPTION:  67 y/o morbidly obese female with hx polymalgia rheumatica on chronic prednisone, COPD, OSA/OHS on CPAP initially admitted to [facility] hospital with acute on chronic respiratory failure secondary to pneumonia.  She was tx to [facility] on BiPAP and cont to decline from respiratory standpoint.  She was intubated 10/31 in [facility]. PCCM consulted for vent mgmt and possible trach.   SIGNIFICANT EVENTS / STUDIES:  10/30 - Admitted to [facility] on BiPAP  10/31 - Failed BiPAP, intubated   LINES / TUBES: ETT 10/31>>>11/6  Trach (ENT) 11/6>>>  CULTURES: 11/1  Urine >>> neg  11/1  Blood >>>neg  PTA  Sputum ([place]) >>> H Flu   ANTIBIOTICS: Rocephin (pta) >>> ??? Flagyl (pta) >>> Meropenem 11/1 >>> Vancomycin 10/30 >>>  OVERNIGHT: Tolerated ATC daytime  Less responsive per RT  VITAL SIGNS:  Reviewed.  PHYSICAL EXAMINATION: General: Morbidly obese, comfortable weaning on vent  Neuro: Awake, nonverbal, nonfocal HEENT: Trach unremarkable  Cardiovascular: Regular, no murmurs Lungs: Bilateral diminished air entry Abdomen: Obese, soft, bowel sounds present Musculoskeletal:  Moves all extremities  Recent Labs Lab 12/29/12 0540 12/30/12 0545 01/02/13 0500  NA 138 140 143  K 5.3* 4.2 3.6  CL 103 104 107  CO2 19 18* 22  BUN 150* 154* 172*  CREATININE 2.30* 2.22* 1.55*  GLUCOSE 255* 234* 131*    Recent Labs Lab 12/28/12 0545 12/30/12 0545 01/02/13 0500  HGB 10.5* 9.0* 8.1*  HCT 33.2* 28.0* 24.8*  WBC 11.5* 8.8 8.0  PLT 167 173 185   Dg Chest Port 1 View  01/01/2013   CLINICAL DATA:  Shortness of breath.  EXAM: PORTABLE CHEST - 1 VIEW  COMPARISON:  12/22/2012  FINDINGS: Support devices remain in stable position.  Cardiomegaly with vascular congestion. Mild perihilar and lower lobe interstitial prominence could reflect early interstitial edema or atelectasis. No real change since prior study. No effusions. No acute bony abnormality. Low lung volumes.  IMPRESSION: Stable support devices.  Cardiomegaly with vascular congestion.  Perihilar and lower lobe interstitial prominence could reflect atelectasis or early interstitial edema.  No real change.   Electronically Signed   By: Charlett Nose M.D.   On: 01/01/2013 11:17   Dg Abd Portable 1v  01/02/2013   CLINICAL DATA:  NG tube. Evaluate barium presence. Long-term care. Patient needs a gastrostomy.  EXAM: PORTABLE ABDOMEN - 1 VIEW  COMPARISON:  12/30/2012.  FINDINGS: NG tube noted good anatomic position. Previously identified identified barium in the stomach has almost completely cleared with minimal residual. No bowel distention noted. Mild atelectasis left lung base.  IMPRESSION: Near complete clearing of barium within the stomach. NG tube noted in good anatomic position.   Electronically Signed   By: Maisie Fus  Register   On: 01/02/2013 07:48   ASSESSMENT / PLAN:  Acute on chronic respiratory failure in setting of Hemophilus influenza pneumoniae Possible pulmonary edema and chronic OSA/OHS >She appears to be tolerating weaning attempts well but given her body habitus suspect that in best case scenario we are looking at day time trach/ nocturnal vent.  Plan: -day time ATC. , Noct vent for now, PEG planned today - think she will need vent/snf  UTI  11/8 had 100K yeast, new culture sensitivities are pending  Plan:  Per primary   Polymyalgia rheumatica, DM / Hyperglycemia, likely steroids exacerbated  Plan: Per primary service   Discussed on multidisciplinary rounds with select team. Unclear why she is less responsive than prior- Will rpt ABG to check for hypercarbia  [name] MD. FCCP. [place] [facility] Pager [phone] If no response call  [phone]     01/02/2013, 10:32 AM

## 2013-01-03 LAB — URINE CULTURE
Colony Count: NO GROWTH
Culture: NO GROWTH

## 2013-01-03 LAB — BLOOD GAS, ARTERIAL
Acid-base deficit: 3 mmol/L — ABNORMAL HIGH (ref 0.0–2.0)
Bicarbonate: 20.7 mEq/L (ref 20.0–24.0)
FIO2: 0.28 %
MECHVT: 450 mL
TCO2: 21.7 mmol/L (ref 0–100)
pCO2 arterial: 35 mmHg (ref 35.0–45.0)
pH, Arterial: 7.399 (ref 7.350–7.450)
pO2, Arterial: 157 mmHg — ABNORMAL HIGH (ref 80.0–100.0)

## 2013-01-04 LAB — CULTURE, RESPIRATORY W GRAM STAIN

## 2013-01-05 LAB — BLOOD GAS, ARTERIAL
Acid-Base Excess: 2.3 mmol/L — ABNORMAL HIGH (ref 0.0–2.0)
Bicarbonate: 26.8 mEq/L — ABNORMAL HIGH (ref 20.0–24.0)
FIO2: 0.28 %
MECHVT: 450 mL
O2 Saturation: 94.1 %
Patient temperature: 98.6
RATE: 10 resp/min
pCO2 arterial: 45.8 mmHg — ABNORMAL HIGH (ref 35.0–45.0)
pO2, Arterial: 70.9 mmHg — ABNORMAL LOW (ref 80.0–100.0)

## 2013-01-05 LAB — CBC
Hemoglobin: 8.7 g/dL — ABNORMAL LOW (ref 12.0–15.0)
MCH: 31.2 pg (ref 26.0–34.0)
MCHC: 30.5 g/dL (ref 30.0–36.0)
MCV: 102.2 fL — ABNORMAL HIGH (ref 78.0–100.0)
RBC: 2.79 MIL/uL — ABNORMAL LOW (ref 3.87–5.11)

## 2013-01-05 LAB — BASIC METABOLIC PANEL
CO2: 27 mEq/L (ref 19–32)
Calcium: 8.9 mg/dL (ref 8.4–10.5)
Chloride: 116 mEq/L — ABNORMAL HIGH (ref 96–112)
Creatinine, Ser: 0.62 mg/dL (ref 0.50–1.10)
GFR calc non Af Amer: >90 mL/min (ref >90)
Glucose, Bld: 156 mg/dL — ABNORMAL HIGH (ref 70–99)

## 2013-01-06 ENCOUNTER — Ambulatory Visit: Payer: Medicare Other | Admitting: Internal Medicine

## 2013-01-06 ENCOUNTER — Other Ambulatory Visit (HOSPITAL_COMMUNITY): Payer: Self-pay

## 2013-01-06 LAB — BLOOD GAS, ARTERIAL
Bicarbonate: 26.6 mEq/L — ABNORMAL HIGH (ref 20.0–24.0)
MECHVT: 450 mL
PEEP: 5 cmH2O
Patient temperature: 98.6
pCO2 arterial: 43.9 mmHg (ref 35.0–45.0)
pH, Arterial: 7.399 (ref 7.350–7.450)

## 2013-01-06 LAB — BASIC METABOLIC PANEL
BUN: 63 mg/dL — ABNORMAL HIGH (ref 6–23)
Calcium: 8.8 mg/dL (ref 8.4–10.5)
Chloride: 111 mEq/L (ref 96–112)
Creatinine, Ser: 0.58 mg/dL (ref 0.50–1.10)
GFR calc Af Amer: >90 mL/min (ref >90)
GFR calc non Af Amer: >90 mL/min (ref >90)
Glucose, Bld: 244 mg/dL — ABNORMAL HIGH (ref 70–99)

## 2013-01-06 LAB — CBC
HCT: 27.9 % — ABNORMAL LOW (ref 36.0–46.0)
MCH: 31.6 pg (ref 26.0–34.0)
MCHC: 31.2 g/dL (ref 30.0–36.0)
MCV: 101.5 fL — ABNORMAL HIGH (ref 78.0–100.0)
RBC: 2.75 MIL/uL — ABNORMAL LOW (ref 3.87–5.11)
RDW: 23.3 % — ABNORMAL HIGH (ref 11.5–15.5)

## 2013-01-07 LAB — CULTURE, BLOOD (ROUTINE X 2): Culture: NO GROWTH

## 2013-01-07 LAB — URINALYSIS, ROUTINE W REFLEX MICROSCOPIC
Bilirubin Urine: NEGATIVE
Glucose, UA: 250 mg/dL — AB
Protein, ur: 100 mg/dL — AB
Specific Gravity, Urine: 1.023 (ref 1.005–1.030)

## 2013-01-07 LAB — URINE MICROSCOPIC-ADD ON

## 2013-01-08 LAB — BASIC METABOLIC PANEL
BUN: 39 mg/dL — ABNORMAL HIGH (ref 6–23)
CO2: 27 mEq/L (ref 19–32)
Chloride: 107 mEq/L (ref 96–112)
Creatinine, Ser: 0.56 mg/dL (ref 0.50–1.10)
GFR calc Af Amer: >90 mL/min (ref >90)
GFR calc non Af Amer: >90 mL/min (ref >90)
Potassium: 3.8 mEq/L (ref 3.5–5.1)

## 2013-01-08 LAB — CBC
HCT: 27 % — ABNORMAL LOW (ref 36.0–46.0)
Hemoglobin: 8.4 g/dL — ABNORMAL LOW (ref 12.0–15.0)
MCH: 31.6 pg (ref 26.0–34.0)
MCV: 101.5 fL — ABNORMAL HIGH (ref 78.0–100.0)
Platelets: 233 10*3/uL (ref 150–400)
RBC: 2.66 MIL/uL — ABNORMAL LOW (ref 3.87–5.11)
RDW: 22.5 % — ABNORMAL HIGH (ref 11.5–15.5)
WBC: 5.4 10*3/uL (ref 4.0–10.5)

## 2013-01-08 LAB — TSH: TSH: 3.202 u[IU]/mL (ref 0.350–4.500)

## 2013-01-09 ENCOUNTER — Other Ambulatory Visit (HOSPITAL_COMMUNITY): Payer: Self-pay

## 2013-01-09 LAB — BASIC METABOLIC PANEL
BUN: 31 mg/dL — ABNORMAL HIGH (ref 6–23)
CO2: 31 mEq/L (ref 19–32)
Calcium: 8.9 mg/dL (ref 8.4–10.5)
Creatinine, Ser: 0.48 mg/dL — ABNORMAL LOW (ref 0.50–1.10)
GFR calc non Af Amer: >90 mL/min (ref >90)
Glucose, Bld: 171 mg/dL — ABNORMAL HIGH (ref 70–99)
Sodium: 148 mEq/L — ABNORMAL HIGH (ref 135–145)

## 2013-01-09 LAB — CULTURE, BLOOD (ROUTINE X 2)
Culture: NO GROWTH
Culture: NO GROWTH

## 2013-01-09 LAB — CK: Total CK: 25 U/L (ref 7–177)

## 2013-01-09 LAB — URINE CULTURE

## 2013-01-09 NOTE — Progress Notes (Signed)
PULMONARY  / CRITICAL CARE MEDICINE  Name: [name] MRN: [id number] DOB: [birth year]    ADMISSION DATE:  12/08/2012 CONSULTATION DATE:  12/12/12  REFERRING MD :  [facility] PRIMARY SERVICE:  [facility]  CHIEF COMPLAINT:  Acute respiratory failure  BRIEF PATIENT DESCRIPTION:  67 y/o morbidly obese female with hx polymalgia rheumatica on chronic prednisone, COPD, OSA/OHS on CPAP initially admitted to [facility] hospital with acute on chronic respiratory failure secondary to pneumonia.  She was tx to [facility] on BiPAP and cont to decline from respiratory standpoint.  She was intubated 10/31 in [facility]. PCCM consulted for vent mgmt and possible trach.   SIGNIFICANT EVENTS / STUDIES:  10/30 - Admitted to [facility] on BiPAP  10/31 - Failed BiPAP, intubated   LINES / TUBES: ETT 10/31>>>11/6  Trach (ENT) 11/6>>>  CULTURES: 11/1  Urine >>> neg  11/1  Blood >>>neg  11/23: blood VRE 11/29: UC: insig growth 11/29: BCX2: >>>   ANTIBIOTICS: Rocephin (pta) >>> ??? Flagyl (pta) >>> Meropenem 11/1 >>>off Vancomycin 10/30 >>>off cubicin 11/27>>>  OVERNIGHT: More awake   VITAL SIGNS:  Reviewed.  PHYSICAL EXAMINATION: General: Morbidly obese, comfortable weaning on vent  Neuro: Awake, nonverbal, nonfocal HEENT: Trach unremarkable  Cardiovascular: Regular, no murmurs Lungs: Bilateral diminished air entry Abdomen: Obese, soft, bowel sounds present Musculoskeletal:  Moves all extremities  Recent Labs Lab 01/06/13 0610 01/08/13 0630 01/09/13 0500  NA 149* 149* 148*  K 3.9 3.8 3.8  CL 111 107 109  CO2 24 27 31   BUN 63* 39* 31*  CREATININE 0.58 0.56 0.48*  GLUCOSE 244* 175* 171*    Recent Labs Lab 01/05/13 0530 01/06/13 0610 01/08/13 0630  HGB 8.7* 8.7* 8.4*  HCT 28.5* 27.9* 27.0*  WBC 6.6 6.3 5.4  PLT 197 192 233   No results found. ASSESSMENT / PLAN:  Acute on chronic respiratory failure in setting of Hemophilus influenza pneumoniae Possible pulmonary edema and chronic OSA/OHS >She  appears to be tolerating weaning attempts but still needing higher PS support. May eventually come off vent in day time but more likely will require vent support 24/7 Plan: -needs vent snf -cont weaning as tol   UTI  11/8 had 100K yeast, new culture sensitivities are pending Plan:  Per primary   VRE bacteremia 11/23 Plan Per primary service   Polymyalgia rheumatica, DM / Hyperglycemia, likely steroids exacerbated  Plan: Per primary service   Discussed on multidisciplinary rounds with select team. Unclear why she is less responsive than prior- Will rpt ABG to check for hypercarbia  PS trials today at 12/5 for 16 hours as target.  01/09/2013, 8:53 AM  Patient seen and examined, agree with above note.  I dictated the care and orders written for this patient under my direction.  [name], MD [phone]

## 2013-01-10 LAB — BASIC METABOLIC PANEL
BUN: 23 mg/dL (ref 6–23)
CO2: 33 mEq/L — ABNORMAL HIGH (ref 19–32)
Calcium: 8.7 mg/dL (ref 8.4–10.5)
Chloride: 108 mEq/L (ref 96–112)
Creatinine, Ser: 0.5 mg/dL (ref 0.50–1.10)
Glucose, Bld: 94 mg/dL (ref 70–99)
Potassium: 3.5 mEq/L (ref 3.5–5.1)
Sodium: 149 mEq/L — ABNORMAL HIGH (ref 135–145)

## 2013-01-11 ENCOUNTER — Other Ambulatory Visit (HOSPITAL_COMMUNITY): Payer: Self-pay

## 2013-01-11 LAB — CBC
HCT: 29.4 % — ABNORMAL LOW (ref 36.0–46.0)
Hemoglobin: 9 g/dL — ABNORMAL LOW (ref 12.0–15.0)
MCH: 31.3 pg (ref 26.0–34.0)
MCHC: 30.6 g/dL (ref 30.0–36.0)
MCV: 102.1 fL — ABNORMAL HIGH (ref 78.0–100.0)
Platelets: 261 10*3/uL (ref 150–400)
WBC: 6.7 10*3/uL (ref 4.0–10.5)

## 2013-01-11 LAB — BASIC METABOLIC PANEL
BUN: 24 mg/dL — ABNORMAL HIGH (ref 6–23)
Calcium: 8.6 mg/dL (ref 8.4–10.5)
Creatinine, Ser: 0.5 mg/dL (ref 0.50–1.10)
GFR calc non Af Amer: >90 mL/min (ref >90)
Glucose, Bld: 258 mg/dL — ABNORMAL HIGH (ref 70–99)
Potassium: 4.4 mEq/L (ref 3.5–5.1)

## 2013-01-11 LAB — PROCALCITONIN: Procalcitonin: 1.17 ng/mL

## 2013-01-11 NOTE — Progress Notes (Signed)
PULMONARY  / CRITICAL CARE MEDICINE  Name: [name] MRN: [id number] DOB: 1945-11-18    ADMISSION DATE:  12/08/2012 CONSULTATION DATE:  12/12/12  REFERRING MD :  [facility] PRIMARY SERVICE:  [facility]  CHIEF COMPLAINT:  Acute respiratory failure  BRIEF PATIENT DESCRIPTION:  67 y/o morbidly obese female with hx polymalgia rheumatica on chronic prednisone, COPD, OSA/OHS on CPAP initially admitted to [facility] hospital with acute on chronic respiratory failure secondary to pneumonia.  She was tx to [facility] on BiPAP and cont to decline from respiratory standpoint.  She was intubated 10/31 in [facility]. PCCM consulted for vent mgmt and possible trach.   SIGNIFICANT EVENTS / STUDIES:  10/30 - Admitted to [facility] on BiPAP  10/31 - Failed BiPAP, intubated   LINES / TUBES: ETT 10/31>>>11/6  Trach (ENT) 11/6>>>  CULTURES: 11/1  Urine >>> neg  11/1  Blood >>>neg  11/23: blood VRE 11/29: UC: insig growth 11/29: BCX2: >>>   ANTIBIOTICS: Rocephin (pta) >>> ??? Flagyl (pta) >>> Meropenem 11/1 >>>off Vancomycin 10/30 >>>off cubicin 11/27>>>  OVERNIGHT: More awake   VITAL SIGNS:  Reviewed.  PHYSICAL EXAMINATION: General: Morbidly obese, comfortable weaning on vent  Neuro: Awake, nonverbal, nonfocal HEENT: Trach unremarkable  Cardiovascular: Regular, no murmurs Lungs: Bilateral diminished air entry Abdomen: Obese, soft, bowel sounds present Musculoskeletal:  Moves all extremities  Recent Labs Lab 01/09/13 0500 01/10/13 0500 01/11/13 0500  NA 148* 149* 143  K 3.8 3.5 4.4  CL 109 108 101  CO2 31 33* 28  BUN 31* 23 24*  CREATININE 0.48* 0.50 0.50  GLUCOSE 171* 94 258*    Recent Labs Lab 01/06/13 0610 01/08/13 0630 01/11/13 0500  HGB 8.7* 8.4* 9.0*  HCT 27.9* 27.0* 29.4*  WBC 6.3 5.4 6.7  PLT 192 233 261   Dg Chest Port 1 View  01/09/2013   CLINICAL DATA:  PICC line inserted, check tip position  EXAM: PORTABLE CHEST - 1 VIEW  COMPARISON:  January 06, 2013  FINDINGS: The patient is  markedly rotated. Cardiac silhouette is enlarged and tracheostomy tube is again identified. Allowing for rotation, left PICC line appears to project over the cavoatrial junction or proximal right atrium. It appears to have been pulled back a few cm when compared to the prior study. There is significant diffuse hazy airspace disease.  IMPRESSION: Limited study due to rotation. PICC line appears to have been retracted a few cm with tip now all likely near the cavoatrial junction or proximal right atrium allowing for limitations described above.   Electronically Signed   By: Esperanza Heir M.D.   On: 01/09/2013 23:38   ASSESSMENT / PLAN:  Acute on chronic respiratory failure in setting of Hemophilus influenza pneumoniae Possible pulmonary edema and chronic OSA/OHS >She appears to be tolerating weaning attempts but still needing higher PS support. May eventually come off vent in day time but more likely will require vent support 24/7 Plan: - Needs vent snf - Cont weaning as tol   UTI  11/8 had 100K yeast, new culture sensitivities are pending Plan:  - Abx reviewed, continue current management.  VRE bacteremia 11/23 Plan - Per primary service   Polymyalgia rheumatica, DM / Hyperglycemia, likely steroids exacerbated  Plan: Per primary service   PS as tolerated, will need vent SNF.  [name], M.D. [facility] Pulmonary/Critical Care Medicine. Pager: [phone]. After hours pager: [phone].

## 2013-01-12 LAB — CBC
HCT: 24.8 % — ABNORMAL LOW (ref 36.0–46.0)
MCH: 30.9 pg (ref 26.0–34.0)
MCV: 102.1 fL — ABNORMAL HIGH (ref 78.0–100.0)
RDW: 19.9 % — ABNORMAL HIGH (ref 11.5–15.5)
WBC: 9.4 10*3/uL (ref 4.0–10.5)

## 2013-01-12 LAB — BASIC METABOLIC PANEL
BUN: 26 mg/dL — ABNORMAL HIGH (ref 6–23)
BUN: 26 mg/dL — ABNORMAL HIGH (ref 6–23)
CO2: 32 mEq/L (ref 19–32)
Chloride: 102 mEq/L (ref 96–112)
Chloride: 101 mEq/L (ref 96–112)
Creatinine, Ser: 0.58 mg/dL (ref 0.50–1.10)
Creatinine, Ser: 0.55 mg/dL (ref 0.50–1.10)
GFR calc Af Amer: >90 mL/min (ref >90)
Glucose, Bld: 171 mg/dL — ABNORMAL HIGH (ref 70–99)
Potassium: 4.5 mEq/L (ref 3.5–5.1)

## 2013-01-12 LAB — CULTURE, BLOOD (ROUTINE X 2)
Culture: NO GROWTH
Culture: NO GROWTH

## 2013-01-12 LAB — PREPARE RBC (CROSSMATCH)

## 2013-01-13 LAB — TYPE AND SCREEN: Antibody Screen: NEGATIVE

## 2013-01-13 LAB — CBC
HCT: 29.8 % — ABNORMAL LOW (ref 36.0–46.0)
Platelets: 234 10*3/uL (ref 150–400)
RBC: 3.06 MIL/uL — ABNORMAL LOW (ref 3.87–5.11)
WBC: 8.9 10*3/uL (ref 4.0–10.5)

## 2013-01-13 NOTE — Progress Notes (Signed)
PULMONARY  / CRITICAL CARE MEDICINE  Name: [name] MRN: [id number] DOB: 1945-06-05    ADMISSION DATE:  12/08/2012 CONSULTATION DATE:  12/12/12  REFERRING MD :  [facility] PRIMARY SERVICE:  [facility]  CHIEF COMPLAINT:  Acute respiratory failure  BRIEF PATIENT DESCRIPTION:  67 y/o morbidly obese female with hx polymalgia rheumatica on chronic prednisone, COPD, OSA/OHS on CPAP initially admitted to [facility] hospital with acute on chronic respiratory failure secondary to pneumonia.  She was tx to [facility] on BiPAP and cont to decline from respiratory standpoint.  She was intubated 10/31 in [facility]. PCCM consulted for vent mgmt and possible trach.   SIGNIFICANT EVENTS / STUDIES:  10/30 - Admitted to [facility] on BiPAP  10/31 - Failed BiPAP, intubated   LINES / TUBES: ETT 10/31>>>11/6  Trach (ENT) 11/6>>>  CULTURES: 11/1  Urine >>> neg  11/1  Blood >>>neg  11/23: blood VRE 11/29: UC: insig growth 11/29: BCX2: >>>  ANTIBIOTICS: Rocephin (pta) >>> ??? Flagyl (pta) >>> Meropenem 11/1 >>>off Vancomycin 10/30 >>>off cubicin 11/27>>>  OVERNIGHT: More awake   VITAL SIGNS:  Reviewed.  PHYSICAL EXAMINATION: General: Morbidly obese, comfortable weaning on vent  Neuro: Awake, nonverbal, nonfocal HEENT: Trach unremarkable  Cardiovascular: Regular, no murmurs Lungs: Bilateral diminished air entry Abdomen: Obese, soft, bowel sounds present Musculoskeletal:  Moves all extremities  Recent Labs Lab 01/11/13 0500 01/12/13 0525 01/12/13 0915  NA 143 140 141  K 4.4 4.5 4.3  CL 101 102 101  CO2 28 30 32  BUN 24* 26* 26*  CREATININE 0.50 0.58 0.55  GLUCOSE 258* 171* 143*    Recent Labs Lab 01/11/13 0500 01/12/13 0525 01/13/13 0945  HGB 9.0* 7.5* 9.7*  HCT 29.4* 24.8* 29.8*  WBC 6.7 9.4 8.9  PLT 261 245 234   Dg Chest Port 1 View  01/11/2013   CLINICAL DATA:  PICC line placement.  EXAM: PORTABLE CHEST - 1 VIEW  COMPARISON:  01/09/2013  FINDINGS: Tracheostomy tube is unchanged. Left-sided PICC  line is again seen with tip difficult to visualize but appears to be over the SVC just below the level of the carina. There has been placement of a right-sided PICC line with tip difficulty to accurately identify but appears to be over the SVC just below the level of the carina. The lungs are somewhat hypoinflated with perihilar opacification suggesting mild interstitial edema. Stable cardiomegaly. Remainder the exam is unchanged.  IMPRESSION: Evidence of mild interstitial edema and mild stable cardiomegaly.  Tubes and lines as described.   Electronically Signed   By: Elberta Fortis M.D.   On: 01/11/2013 19:59   ASSESSMENT / PLAN:  Acute on chronic respiratory failure in setting of Hemophilus influenza pneumoniae Possible pulmonary edema and chronic OSA/OHS >She appears to be tolerating weaning attempts but still needing higher PS support. May eventually come off vent in day time but more likely will require vent support 24/7 Plan: - Needs vent SNF. - Hold further weaning efforts at this time given sepsis.   UTI  11/8 had 100K yeast, new culture sensitivities are pending VRE in blood. Plan:  - Abx reviewed, continue current management.  VRE bacteremia 11/23 Plan - Per primary service   Polymyalgia rheumatica, DM / Hyperglycemia, likely steroids exacerbated  Plan: Per primary service   Hold further weaning trials, continue abx, once sepsis is addressed then transfer to vent SNF, highly doubtful will be able to wean off vent.  [name], M.D. [facility] Pulmonary/Critical Care Medicine. Pager: [phone]. After hours  pager: [phone].

## 2013-01-16 DIAGNOSIS — N3000 Acute cystitis without hematuria: Secondary | ICD-10-CM

## 2013-01-16 LAB — BASIC METABOLIC PANEL
BUN: 25 mg/dL — ABNORMAL HIGH (ref 6–23)
CO2: 40 mEq/L (ref 19–32)
Chloride: 89 mEq/L — ABNORMAL LOW (ref 96–112)
GFR calc Af Amer: >90 mL/min (ref >90)
Glucose, Bld: 198 mg/dL — ABNORMAL HIGH (ref 70–99)
Potassium: 3.5 mEq/L (ref 3.5–5.1)

## 2013-01-16 LAB — CBC
HCT: 33.6 % — ABNORMAL LOW (ref 36.0–46.0)
Hemoglobin: 10.6 g/dL — ABNORMAL LOW (ref 12.0–15.0)
RDW: 18.7 % — ABNORMAL HIGH (ref 11.5–15.5)
WBC: 11.5 10*3/uL — ABNORMAL HIGH (ref 4.0–10.5)

## 2013-01-16 LAB — CULTURE, BLOOD (ROUTINE X 2)

## 2013-01-16 NOTE — Progress Notes (Signed)
PULMONARY  / CRITICAL CARE MEDICINE  Name: [name] MRN: [id number] DOB: 1945-05-18    ADMISSION DATE:  12/08/2012 CONSULTATION DATE:  12/12/12  REFERRING MD :  [facility] PRIMARY SERVICE:  [facility]  CHIEF COMPLAINT:  Acute respiratory failure  BRIEF PATIENT DESCRIPTION:  67 y/o morbidly obese female with hx polymalgia rheumatica on chronic prednisone, COPD, OSA/OHS on CPAP initially admitted to [facility] hospital with acute on chronic respiratory failure secondary to pneumonia.  She was tx to [facility] on BiPAP and cont to decline from respiratory standpoint.  She was intubated 10/31 in [facility]. PCCM consulted for vent mgmt and possible trach.   SIGNIFICANT EVENTS / STUDIES:  10/30 - Admitted to [facility] on BiPAP  10/31 - Failed BiPAP, intubated   LINES / TUBES: ETT 10/31>>>11/6  Trach (ENT) 11/6>>>  CULTURES: 11/1  Urine >>> neg  11/1  Blood >>>neg  11/23 blood VRE 11/29 UC: insig growth 11/29 BCX2: >>>  ANTIBIOTICS: Rocephin (pta) >>> ??? Flagyl (pta) >>> Meropenem 11/1 >>>off Vancomycin 10/30 >>>off cubicin 11/27>>>  OVERNIGHT: no acute events.  Weaning on PSV  VITAL SIGNS:  Reviewed at bedside.   PHYSICAL EXAMINATION: General: Morbidly obese, comfortable weaning on vent  Neuro: Awake, nonverbal, nonfocal HEENT: Trach unremarkable  Cardiovascular: Regular, no murmurs Lungs: Bilateral diminished air entry Abdomen: Obese, soft, bowel sounds present Musculoskeletal:  Moves all extremities  Recent Labs Lab 01/12/13 0525 01/12/13 0915 01/16/13 0500  NA 140 141 138  K 4.5 4.3 3.5  CL 102 101 89*  CO2 30 32 40*  BUN 26* 26* 25*  CREATININE 0.58 0.55 0.37*  GLUCOSE 171* 143* 198*    Recent Labs Lab 01/12/13 0525 01/13/13 0945 01/16/13 0500  HGB 7.5* 9.7* 10.6*  HCT 24.8* 29.8* 33.6*  WBC 9.4 8.9 11.5*  PLT 245 234 306   No results found.  ASSESSMENT / PLAN:  Acute on chronic respiratory failure in setting of Hemophilus influenza pneumoniae Possible pulmonary edema  and chronic OSA/OHS Plan: - May require vent SNF - RE start PS 10-12 weaning today as bacteremia improving - pulmonary hygiene as able / upright positioning - trend CXR No Trach collar  UTI  11/8 had 100K yeast, new culture sensitivities are pending VRE in blood.  Plan:  - Abx reviewed, continue current management.  VRE bacteremia 11/23 Plan: - Per primary service   Polymyalgia rheumatica, DM / Hyperglycemia, likely steroids exacerbated  Plan: Per primary service   Canary Brim, NP-C [place] [facility] Pgr: [phone] or [phone]  I have fully examined this patient and agree with above findings.    And edited in full  Mcarthur Rossetti. Tyson Alias, MD, FACP Pgr: [phone]  Pulmonary & Critical Care

## 2013-01-18 ENCOUNTER — Encounter: Payer: Self-pay | Admitting: Radiology

## 2013-01-18 ENCOUNTER — Other Ambulatory Visit (HOSPITAL_COMMUNITY): Payer: Self-pay

## 2013-01-18 LAB — CBC
HCT: 28.6 % — ABNORMAL LOW (ref 36.0–46.0)
Hemoglobin: 8.9 g/dL — ABNORMAL LOW (ref 12.0–15.0)
MCHC: 31.1 g/dL (ref 30.0–36.0)
MCV: 99.7 fL (ref 78.0–100.0)
RBC: 2.87 MIL/uL — ABNORMAL LOW (ref 3.87–5.11)
RDW: 18.2 % — ABNORMAL HIGH (ref 11.5–15.5)

## 2013-01-18 LAB — BASIC METABOLIC PANEL
CO2: 34 mEq/L — ABNORMAL HIGH (ref 19–32)
Calcium: 7.6 mg/dL — ABNORMAL LOW (ref 8.4–10.5)
Chloride: 91 mEq/L — ABNORMAL LOW (ref 96–112)
GFR calc Af Amer: >90 mL/min (ref >90)
GFR calc non Af Amer: >90 mL/min (ref >90)
Glucose, Bld: 150 mg/dL — ABNORMAL HIGH (ref 70–99)
Potassium: 3 mEq/L — ABNORMAL LOW (ref 3.5–5.1)
Sodium: 135 mEq/L (ref 135–145)

## 2013-01-18 MED ORDER — IOHEXOL 300 MG/ML  SOLN
100.0000 mL | Freq: Once | INTRAMUSCULAR | Status: AC | PRN
  Administered 2013-01-18: 100 mL via INTRAVENOUS

## 2013-01-20 ENCOUNTER — Encounter (HOSPITAL_COMMUNITY): Payer: Self-pay | Admitting: Gastroenterology

## 2013-01-20 ENCOUNTER — Encounter
Admission: AD | Disposition: A | Discharge status: Skilled Nursing Facility | Payer: Self-pay | Source: Ambulatory Visit | Attending: Internal Medicine

## 2013-01-20 DIAGNOSIS — I369 Nonrheumatic tricuspid valve disorder, unspecified: Secondary | ICD-10-CM

## 2013-01-20 HISTORY — PX: TEE WITHOUT CARDIOVERSION: SHX5443

## 2013-01-20 LAB — BASIC METABOLIC PANEL
BUN: 12 mg/dL (ref 6–23)
CO2: 36 mEq/L — ABNORMAL HIGH (ref 19–32)
Chloride: 91 mEq/L — ABNORMAL LOW (ref 96–112)
GFR calc Af Amer: >90 mL/min (ref >90)
Glucose, Bld: 140 mg/dL — ABNORMAL HIGH (ref 70–99)
Potassium: 3.9 mEq/L (ref 3.5–5.1)
Sodium: 135 mEq/L (ref 135–145)

## 2013-01-20 LAB — CBC
HCT: 32.7 % — ABNORMAL LOW (ref 36.0–46.0)
Hemoglobin: 10.4 g/dL — ABNORMAL LOW (ref 12.0–15.0)
MCHC: 31.8 g/dL (ref 30.0–36.0)
RBC: 3.32 MIL/uL — ABNORMAL LOW (ref 3.87–5.11)

## 2013-01-20 SURGERY — ECHOCARDIOGRAM, TRANSESOPHAGEAL
Anesthesia: Moderate Sedation

## 2013-01-20 MED ORDER — MIDAZOLAM HCL 5 MG/ML IJ SOLN
INTRAMUSCULAR | Status: AC
  Filled 2013-01-20: qty 2

## 2013-01-20 MED ORDER — DIPHENHYDRAMINE HCL 50 MG/ML IJ SOLN
INTRAMUSCULAR | Status: AC
  Filled 2013-01-20: qty 1

## 2013-01-20 MED ORDER — MIDAZOLAM HCL 10 MG/2ML IJ SOLN
INTRAMUSCULAR | Status: DC | PRN
  Administered 2013-01-20: 1 mg via INTRAVENOUS
  Administered 2013-01-20: 2 mg via INTRAVENOUS

## 2013-01-20 MED ORDER — FENTANYL CITRATE 0.05 MG/ML IJ SOLN
INTRAMUSCULAR | Status: DC | PRN
  Administered 2013-01-20 (×4): 25 ug via INTRAVENOUS

## 2013-01-20 MED ORDER — SODIUM CHLORIDE 0.9 % IV SOLN: INTRAVENOUS | Status: DC

## 2013-01-20 MED ORDER — FENTANYL CITRATE 0.05 MG/ML IJ SOLN
INTRAMUSCULAR | Status: AC
  Filled 2013-01-20: qty 4

## 2013-01-20 NOTE — H&P (View-Only) (Signed)
PULMONARY  / CRITICAL CARE MEDICINE  Name: [name] MRN: [id number] DOB: 01/18/1946    ADMISSION DATE:  12/08/2012 CONSULTATION DATE:  12/12/12  REFERRING MD :  [facility] PRIMARY SERVICE:  [facility]  CHIEF COMPLAINT:  Acute respiratory failure  BRIEF PATIENT DESCRIPTION:  67 y/o morbidly obese female with hx polymalgia rheumatica on chronic prednisone, COPD, OSA/OHS on CPAP initially admitted to [facility] with acute on chronic respiratory failure secondary to pneumonia.  She was tx to [facility] on BiPAP and cont to decline from respiratory standpoint.  She was intubated 10/31 in [facility]. PCCM consulted for vent mgmt and possible trach.   SIGNIFICANT EVENTS / STUDIES:  10/30 - Admitted to [facility] on BiPAP  10/31 - Failed BiPAP, intubated   LINES / TUBES: ETT 10/31>>>11/6  Trach (ENT) 11/6>>>  CULTURES: 11/1  Urine >>> neg  11/1  Blood >>>neg  11/23 blood VRE 11/29 UC: insig growth 11/29 BCX2: >>>  ANTIBIOTICS: Rocephin (pta) >>> ??? Flagyl (pta) >>> Meropenem 11/1 >>>off Vancomycin 10/30 >>>off cubicin 11/27>>>  OVERNIGHT: no acute events.  Weaning on PSV  VITAL SIGNS:  Reviewed at bedside.   PHYSICAL EXAMINATION: General: Morbidly obese, comfortable weaning on vent  Neuro: Awake, nonverbal, nonfocal HEENT: Trach unremarkable  Cardiovascular: Regular, no murmurs Lungs: Bilateral diminished air entry Abdomen: Obese, soft, bowel sounds present Musculoskeletal:  Moves all extremities  Recent Labs Lab 01/12/13 0525 01/12/13 0915 01/16/13 0500  NA 140 141 138  K 4.5 4.3 3.5  CL 102 101 89*  CO2 30 32 40*  BUN 26* 26* 25*  CREATININE 0.58 0.55 0.37*  GLUCOSE 171* 143* 198*    Recent Labs Lab 01/12/13 0525 01/13/13 0945 01/16/13 0500  HGB 7.5* 9.7* 10.6*  HCT 24.8* 29.8* 33.6*  WBC 9.4 8.9 11.5*  PLT 245 234 306   No results found.  ASSESSMENT / PLAN:  Acute on chronic respiratory failure in setting of Hemophilus influenza pneumoniae Possible pulmonary edema  and chronic OSA/OHS Plan: - May require vent SNF - RE start PS 10-12 weaning today as bacteremia improving - pulmonary hygiene as able / upright positioning - trend CXR No Trach collar  UTI  11/8 had 100K yeast, new culture sensitivities are pending VRE in blood.  Plan:  - Abx reviewed, continue current management.  VRE bacteremia 11/23 Plan: - Per primary service   Polymyalgia rheumatica, DM / Hyperglycemia, likely steroids exacerbated  Plan: Per primary service   Brandi Ollis, NP-C [place] [facility] Pgr: [phone] or [phone]  I have fully examined this patient and agree with above findings.    And edited in full  [place] Mckeough J. Gracelynne Benedict, MD, FACP Pgr: [phone] [place] [facility]    

## 2013-01-20 NOTE — Progress Notes (Signed)
Echocardiogram Echocardiogram Transesophageal has been performed.  [name] 01/20/2013, 9:28 AM

## 2013-01-20 NOTE — Interval H&P Note (Signed)
History and Physical Interval Note:  01/20/2013 8:42 AM  [name]  has presented today for surgery, with the diagnosis of r/o endocarditis  The various methods of treatment have been discussed with the patient and family. After consideration of risks, benefits and other options for treatment, the patient has consented to  Procedure(s): TRANSESOPHAGEAL ECHOCARDIOGRAM (TEE) (N/A) as a surgical intervention .  The patient's history has been reviewed, patient examined, no change in status, stable for surgery.  I have reviewed the patient's chart and labs.  Questions were answered to the patient's satisfaction.    we need to look for endocarditis   [name].

## 2013-01-20 NOTE — CV Procedure (Signed)
    Transesophageal Echocardiogram Note  [name] [id number] Jul 03, 1945  Procedure: Transesophageal Echocardiogram Indications: bacteremia  Procedure Details Consent: Obtained Time Out: Verified patient identification, verified procedure, site/side was marked, verified correct patient position, special equipment/implants available, Radiology Safety Procedures followed,  medications/allergies/relevent history reviewed, required imaging and test results available.  Performed  Medications: Fentanyl: 100 mcg iv Versed: 5 mg iv  Left Ventrical:  Normal LV function  Mitral Valve: normal MV. No vegetation  Aortic Valve: normal 3 leaflet valve, no vegetation  Tricuspid Valve: normal, no vegetation, mild TR  Pulmonic Valve: not well vis but no vegetation seen.  No PUI  Left Atrium/ Left atrial appendage: no thrombus in LAA  Atrial septum: intact  Aorta: mild calcification   Complications: No apparent complications Patient did tolerate procedure well.   Vesta Mixer, Montez Hageman., MD, [facility] 01/20/2013, 9:06 AM

## 2013-01-22 LAB — CBC
HCT: 30.3 % — ABNORMAL LOW (ref 36.0–46.0)
Hemoglobin: 10 g/dL — ABNORMAL LOW (ref 12.0–15.0)
MCH: 31.9 pg (ref 26.0–34.0)
MCHC: 33 g/dL (ref 30.0–36.0)
RDW: 17.5 % — ABNORMAL HIGH (ref 11.5–15.5)

## 2013-01-23 ENCOUNTER — Encounter (HOSPITAL_COMMUNITY): Payer: Self-pay | Admitting: Cardiovascular Disease

## 2013-01-23 NOTE — Progress Notes (Signed)
PULMONARY  / CRITICAL CARE MEDICINE  Name: [name] MRN: [id number] DOB: 02/09/1946    ADMISSION DATE:  12/08/2012 CONSULTATION DATE:  12/12/12  REFERRING MD :  [facility] PRIMARY SERVICE:  [facility]  CHIEF COMPLAINT:  Acute respiratory failure  BRIEF PATIENT DESCRIPTION:  67 y/o morbidly obese female with hx polymalgia rheumatica on chronic prednisone, COPD, OSA/OHS on CPAP initially admitted to [facility] hospital with acute on chronic respiratory failure secondary to pneumonia.  She was tx to [facility] on BiPAP and cont to decline from respiratory standpoint.  She was intubated 10/31 in [facility]. PCCM consulted for vent mgmt and possible trach.   SIGNIFICANT EVENTS / STUDIES:  10/30 - Admitted to [facility] on BiPAP  10/31 - Failed BiPAP, intubated  12/15 - weaning on ATC, 4 hour goal  LINES / TUBES: ETT 10/31>>>11/6  Trach (ENT) 11/6>>>  CULTURES: 11/1  Urine >>> neg  11/1  Blood >>>neg  11/23 blood>>>VRE>>>sens gent 11/29 UC: insig growth 11/28 BCX2: >>>neg 12/2 BCx2 coag neg staph 1/2    ANTIBIOTICS: Rocephin (pta) >>> ??? Meropenem 11/1 >>>off Vancomycin 10/30 >>>off Flagyl (pta) >>>off Cubicin 11/27>>>  OVERNIGHT:  No acute events, weaning on ATC 35%, goal 4 hours  VITAL SIGNS:  Reviewed at bedside.   PHYSICAL EXAMINATION: General: Morbidly obese, comfortable on ATC  Neuro: Awake, nonfocal HEENT: Trach unremarkable, XLT Cardiovascular: Regular, no murmurs Lungs: Bilateral diminished air entry Abdomen: Obese, soft, bowel sounds present Musculoskeletal:  Moves all extremities  Recent Labs Lab 01/18/13 0500 01/20/13 0500  NA 135 135  K 3.0* 3.9  CL 91* 91*  CO2 34* 36*  BUN 17 12  CREATININE 0.24* 0.25*  GLUCOSE 150* 140*    Recent Labs Lab 01/18/13 0500 01/20/13 0500 01/22/13 0625  HGB 8.9* 10.4* 10.0*  HCT 28.6* 32.7* 30.3*  WBC 7.8 11.1* 14.5*  PLT 263 328 389   No results found.  ASSESSMENT / PLAN:  Acute on chronic respiratory failure - in setting of  Hemophilus influenza pneumoniae, VRE bacteremia  Presumed OSA / OHA Pulmonary Edema    Plan: - continue weaning protocol, monitor respiratory status closely with ATC wean, morbid obesity.  - pulmonary hygiene as able / upright positioning - trend CXR - mobilize as able   UTI  - 11/8 had 100K yeast, repeat UC 11/29 with insignificant growth VRE Bacteremia - 11/23  Plan:  - Abx reviewed, continue current management.   Polymyalgia rheumatica, DM / Hyperglycemia, likely steroids exacerbated  Plan: - prednisone 10 mg QD    Canary Brim, NP-C [facility] Pgr: [phone] or [phone]   Discussed on multidisciplinary rounds with select team  [facility] V.

## 2013-01-24 LAB — BASIC METABOLIC PANEL
BUN: 15 mg/dL (ref 6–23)
CO2: 28 mEq/L (ref 19–32)
Calcium: 9.1 mg/dL (ref 8.4–10.5)
Chloride: 89 mEq/L — ABNORMAL LOW (ref 96–112)
Creatinine, Ser: 0.25 mg/dL — ABNORMAL LOW (ref 0.50–1.10)
GFR calc Af Amer: >90 mL/min (ref >90)
Sodium: 132 mEq/L — ABNORMAL LOW (ref 135–145)

## 2013-01-24 LAB — CK: Total CK: 29 U/L (ref 7–177)

## 2013-01-24 LAB — CBC
MCH: 32 pg (ref 26.0–34.0)
MCV: 96 fL (ref 78.0–100.0)
Platelets: 376 10*3/uL (ref 150–400)
RDW: 17.3 % — ABNORMAL HIGH (ref 11.5–15.5)
WBC: 11.7 10*3/uL — ABNORMAL HIGH (ref 4.0–10.5)

## 2013-01-25 NOTE — Progress Notes (Signed)
PULMONARY  / CRITICAL CARE MEDICINE  Name: [name] MRN: [id number] DOB: [birth year]    ADMISSION DATE:  12/08/2012 CONSULTATION DATE:  12/12/12  REFERRING MD :  [facility] PRIMARY SERVICE:  [facility]  CHIEF COMPLAINT:  Acute respiratory failure  BRIEF PATIENT DESCRIPTION:  67 y/o morbidly obese female with hx polymalgia rheumatica on chronic prednisone, COPD, OSA/OHS on CPAP initially admitted to [facility] hospital with acute on chronic respiratory failure secondary to pneumonia.  She was tx to [facility] on BiPAP and cont to decline from respiratory standpoint.  She was intubated 10/31 in [facility]. PCCM consulted for vent mgmt and possible trach.   SIGNIFICANT EVENTS / STUDIES:  10/30 - Admitted to [facility] on BiPAP  10/31 - Failed BiPAP, intubated  12/15 - weaning on ATC, 4 hour goal 12/17 ATC12 h planned  LINES / TUBES: ETT 10/31>>>11/6  Trach (ENT) 11/6>>>  CULTURES: 11/1  Urine >>> neg  11/1  Blood >>>neg  11/23 blood>>>VRE>>>sens gent 11/29 UC: insig growth 11/28 BCX2: >>>neg 12/2 BCx2 coag neg staph 1/2    ANTIBIOTICS: Rocephin (pta) >>> ??? Meropenem 11/1 >>>off Vancomycin 10/30 >>>off Flagyl (pta) >>>off Cubicin 11/27>>>  OVERNIGHT:  No acute events, weaning on ATC 35%, goal 12 hours Denies CP, dyspnea  VITAL SIGNS:  Reviewed at bedside.   PHYSICAL EXAMINATION: General: Morbidly obese, comfortable on ATC  Neuro: Awake, nonfocal HEENT: Trach unremarkable, XLT Cardiovascular: Regular, no murmurs Lungs: Bilateral diminished air entry Abdomen: Obese, soft, bowel sounds present Musculoskeletal:  Moves all extremities  Recent Labs Lab 01/20/13 0500 01/24/13 0535  NA 135 132*  K 3.9 4.0  CL 91* 89*  CO2 36* 28  BUN 12 15  CREATININE 0.25* 0.25*  GLUCOSE 140* 201*    Recent Labs Lab 01/20/13 0500 01/22/13 0625 01/24/13 0535  HGB 10.4* 10.0* 10.3*  HCT 32.7* 30.3* 30.9*  WBC 11.1* 14.5* 11.7*  PLT 328 389 376   No results found.  ASSESSMENT / PLAN:  Acute on  chronic respiratory failure - in setting of Hemophilus influenza pneumoniae, VRE bacteremia  Presumed OSA / OHA Pulmonary Edema    Plan: - continue weaning protocol, ATC - can plan for 24h  - pulmonary hygiene as able / upright positioning - mobilize as able   UTI  - 11/8 had 100K yeast, repeat UC 11/29 with insignificant growth VRE Bacteremia - 11/23, TEE neg  Plan:  - daptomycin planned x 4 weeks from 12/2 -last neg cx   Polymyalgia rheumatica, DM / Hyperglycemia, likely steroids exacerbated  Plan: - prednisone 10 mg QD    Discussed on multidisciplinary rounds with select team  Rawlin Reaume V.  [phone]

## 2013-01-25 NOTE — Progress Notes (Deleted)
PULMONARY  / CRITICAL CARE MEDICINE  Name: [name] MRN: [id number] DOB: July 22, 1945    ADMISSION DATE:  12/08/2012 CONSULTATION DATE:  12/12/12  REFERRING MD :  [facility] PRIMARY SERVICE:  [facility]  CHIEF COMPLAINT:  Acute respiratory failure  BRIEF PATIENT DESCRIPTION:  67 y/o morbidly obese female with hx polymalgia rheumatica on chronic prednisone, COPD, OSA/OHS on CPAP initially admitted to [facility] hospital with acute on chronic respiratory failure secondary to pneumonia.  She was tx to [facility] on BiPAP and cont to decline from respiratory standpoint.  She was intubated 10/31 in [facility]. PCCM consulted for vent mgmt and possible trach.   SIGNIFICANT EVENTS / STUDIES:  10/30 - Admitted to [facility] on BiPAP  10/31 - Failed BiPAP, intubated  12/15 - weaning on ATC, 4 hour goal  LINES / TUBES: ETT 10/31>>>11/6  Trach (ENT) 11/6>>>  CULTURES: 11/1  Urine >>> neg  11/1  Blood >>>neg  11/23 blood>>>VRE>>>sens gent 11/29 UC: insig growth 11/28 BCX2: >>>neg 12/2 BCx2 coag neg staph 1/2    ANTIBIOTICS: Rocephin (pta) >>> ??? Meropenem 11/1 >>>off Vancomycin 10/30 >>>off Flagyl (pta) >>>off Cubicin 11/27>>>  OVERNIGHT:  No acute events, up to chair yesterday.  No distress on 35% ATC  VITAL SIGNS:  Reviewed at bedside.   PHYSICAL EXAMINATION: General: Morbidly obese, comfortable on ATC  Neuro: Awake, nonfocal HEENT: Trach unremarkable, XLT Cardiovascular: Regular, no murmurs Lungs: Bilateral diminished air entry Abdomen: Obese, soft, bowel sounds present Musculoskeletal:  Moves all extremities  Recent Labs Lab 01/20/13 0500 01/24/13 0535  NA 135 132*  K 3.9 4.0  CL 91* 89*  CO2 36* 28  BUN 12 15  CREATININE 0.25* 0.25*  GLUCOSE 140* 201*    Recent Labs Lab 01/20/13 0500 01/22/13 0625 01/24/13 0535  HGB 10.4* 10.0* 10.3*  HCT 32.7* 30.3* 30.9*  WBC 11.1* 14.5* 11.7*  PLT 328 389 376   No results found.  ASSESSMENT / PLAN:  Acute on chronic respiratory failure - in  setting of Hemophilus influenza pneumoniae, VRE bacteremia  Presumed OSA / OHA Pulmonary Edema    Plan: - continue weaning protocol, monitor respiratory status closely with ATC wean, morbid obesity.  - given morbid obesity, she may need nocturnal vent QHS - pulmonary hygiene as able / upright positioning - trend CXR - mobilize as able   UTI  - 11/8 had 100K yeast, repeat UC 11/29 with insignificant growth VRE Bacteremia - 11/23  Plan:  - Abx reviewed, continue current management.   Polymyalgia rheumatica, DM / Hyperglycemia, likely steroids exacerbated   Plan: - prednisone 10 mg QD    Canary Brim, NP-C [place] [facility] Pgr: [phone] or [phone]   Discussed on multidisciplinary rounds with select team

## 2013-01-27 ENCOUNTER — Other Ambulatory Visit (HOSPITAL_COMMUNITY): Payer: Self-pay

## 2013-01-27 LAB — BASIC METABOLIC PANEL
BUN: 14 mg/dL (ref 6–23)
Creatinine, Ser: 0.28 mg/dL — ABNORMAL LOW (ref 0.50–1.10)
GFR calc Af Amer: >90 mL/min (ref >90)
GFR calc non Af Amer: >90 mL/min (ref >90)
Potassium: 4.1 mEq/L (ref 3.5–5.1)
Sodium: 135 mEq/L (ref 135–145)

## 2013-01-27 LAB — CBC
HCT: 28.8 % — ABNORMAL LOW (ref 36.0–46.0)
MCHC: 31.3 g/dL (ref 30.0–36.0)
RDW: 17.1 % — ABNORMAL HIGH (ref 11.5–15.5)

## 2013-06-02 ENCOUNTER — Telehealth: Payer: Self-pay | Admitting: Internal Medicine

## 2013-06-02 NOTE — Telephone Encounter (Signed)
Will forward to CDY as an FYI 

## 2013-07-28 ENCOUNTER — Telehealth: Payer: Self-pay | Admitting: Internal Medicine

## 2013-07-28 NOTE — Telephone Encounter (Signed)
Please advise Dr. Young thanks 

## 2013-07-28 NOTE — Telephone Encounter (Signed)
We [facility]'t [facility] do that.

## 2013-07-28 NOTE — Telephone Encounter (Signed)
I called made [name] aware. She reports we can schedule her to be seen at the trach clinic after OV adn CDY evaluates her. Nothing further needed

## 2013-07-31 ENCOUNTER — Encounter: Payer: Self-pay | Admitting: Internal Medicine

## 2013-07-31 ENCOUNTER — Ambulatory Visit (INDEPENDENT_AMBULATORY_CARE_PROVIDER_SITE_OTHER): Payer: Medicare Other | Admitting: Internal Medicine

## 2013-07-31 VITALS — BP 78/52 | HR 87 | Ht 65.0 in | Wt 236.0 lb

## 2013-07-31 DIAGNOSIS — R0902 Hypoxemia: Secondary | ICD-10-CM

## 2013-07-31 DIAGNOSIS — J9601 Acute respiratory failure with hypoxia: Secondary | ICD-10-CM

## 2013-07-31 DIAGNOSIS — J9600 Acute respiratory failure, unspecified whether with hypoxia or hypercapnia: Secondary | ICD-10-CM

## 2013-07-31 DIAGNOSIS — J9611 Chronic respiratory failure with hypoxia: Secondary | ICD-10-CM

## 2013-07-31 DIAGNOSIS — J42 Unspecified chronic bronchitis: Secondary | ICD-10-CM

## 2013-07-31 DIAGNOSIS — Z930 Tracheostomy status: Secondary | ICD-10-CM

## 2013-07-31 DIAGNOSIS — J9610 Chronic respiratory failure, unspecified whether with hypoxia or hypercapnia: Secondary | ICD-10-CM

## 2013-07-31 NOTE — Assessment & Plan Note (Addendum)
Near baseline after her acute exacerbation with purulent respiratory secretions improved after doxycycline Plan-portable chest x-ray when she is transported to tracheostomy clinic

## 2013-07-31 NOTE — Progress Notes (Signed)
Subjective:    Patient ID: [name], female    DOB: 05/11/1945, 68 y.o.   MRN: [id number]  HPI 08/14/10- [age] yoF former smoker, followed for asthma/ bronchitis, allergic rhinitis, Sleep apnea, OHS complicated by GERD, DM, polymyalgia rheumatica and CAD.  Husband is here. Last here February 28, 2010- note reviewed She stays in except to go to doctor appointments. As she was wheeled in here she noted pain through sternum to back. Got bad heartburn this week from eating tomatoes. Blowing nose yesterday caused sense of blocked airway. We discussed her therapeutic trial of liquid antacid occasionally.  Sleeps with O2 3 L/M and CPAP 11- sleeps well with it every night, except for her insomnia pattern which comes and goes.  [employer] form filed out.   08/29/12- [age] yoF former smoker, followed for asthma/ bronchitis, allergic rhinitis, Sleep apnea, OHS complicated by GERD, DM, polymyalgia rheumatica and CAD.  Husband here FOLLOW FOR: pt reports lungs have gotten worse, wearing O2 "around the clock" says O2 sats have been running in 70's/80's on room air. O2 3L FOLLOWS FOR: wearing CPAP 11/ Advanced everynight, approx 12-13 hrs per night, doesnt feel she is getting enough o2 from CPAP Occasional cough. Has been on prednisone 10 mg daily for 14 years now for polymyalgia rheumatica CT chest 1/ 24/ 12 IMPRESSION:  No likely significant cardiopulmonary disease. Minimal atelectasis  or scarring at the right base, the lingula and left base.  Multiple thoracic compression fractures in the region from T4-T12  as outlined above. T4, T5, T6, and T8 are age indeterminate but  may be old. T7, T10 and T12 are age indeterminate but are more  suspicious for being acute or subacute fractures.  Provider: Josetta Huddleharity McDonald  07/31/13-67 yoF former smoker, followed for asthma/ bronchitis, allergic rhinitis, Sleep apnea, OHS complicated by GERD, DM, polymyalgia rheumatica and CAD.  Husband and home health  nurse from [employer] here. FOLLOWS FOR:  Increased sob, cough and wheezing x1-2 weeks Patient is brought on gurney from home with portable vent/ O2, by transport team. Admitted to [facility] on 11/28/2012 with CHF, sepsis and H.flu infection. She required intubation. EF was 70% by echo. C. Difficile colitis.. Transferred to [facility]. Recurrent respiratory failure required intubation again. Treated with broad-spectrum antibiotics and stress dose steroids. Blood culture positive VRE. Tracheostomy placed 12/15/2012. Multiple infections with antibiotics and antifungal agents. [facility] with diagnoses of acute on chronic respiratory failure secondary to COPD, OSA, obesity, diastolic heart failure.discharged home in May, 2015. Recent exacerbation due to purulent tracheal infection with green discharge, responded to doxycycline ending 2 weeks ago. Recent Diflucan. Home nurse reports food from trach stoma. This needs to be addressed by GI/ Dr Markham JordanElliot- probably by making her n.p.o. and restricting feeding tube to PEG tube with re- prescription of enteral feeding. She is requiring frequent suctioning for copious secretions but amount and color are much improved after doxycycline. Trilogy home vent, VT 500 ml, PS 12, PEEP 5, IMV, 3-3.5 L/min to keep sat > 92% Trach tube 6 Portex cuffed, [facility]. This tube keeps sliding out and needs replacement with a larger tube. Liberation from trach not anticipated.    ROS-see HPI Constitutional:   No-   weight loss, night sweats, fevers, chills, fatigue, lassitude. HEENT:   No-  headaches, difficulty swallowing, tooth/dental problems, sore throat,       No-  sneezing, itching, ear ache, nasal congestion, post nasal drip,  CV:  No-   chest pain, orthopnea, PND, swelling in lower extremities, anasarca,  dizziness, palpitations Resp: +shortness of breath with exertion or at rest.              No-   productive cough,   No- non-productive cough,  No- coughing up of blood.              No-   change in color of mucus.  No- wheezing.   Skin: No-   rash or lesions. GI:  No-   heartburn, indigestion, +abdominal pain,No- nausea, vomiting, +constipated GU:  MS:  No-   joint pain or swelling.   Neuro-     nothing unusual Psych:  No- change in mood or affect. No depression or anxiety.  No memory loss.    Objective:  OBJ- Physical Exam General- Alert, Oriented, Affect-appropriate, Distress- none acute but chronically ill appearing.                       +vent/trach. On gurney- weak, passive             morbidly obese Skin- rash-none, lesions- none, excoriation- none Lymphadenopathy- none Head- atraumatic            Eyes- Gross vision intact, PERRLA, conjunctivae and secretions clear. +strabismus            Ears- Hearing, canals-normal            Nose- Clear,            Throat- Mallampati II ,  Neck- +tracheostomy Chest - symmetrical excursion , unlabored           Heart/CV- RRR , no murmur , no gallop  , no rub, nl s1 s2                           - JVD?short fat neck , edema+1 chronic, stasis changes-+, varices- none           Lung- +shallow, wheeze- none, cough- none , dullness-none, rub- none           Chest wall-  Abd- soft, quiet, protuberant Br/ Gen/ Rectal- Not done, not indicated Extrem- cyanosis- none, clubbing, none, atrophy- none, strength- nl Neuro- grossly intact to observation  Assessment & Plan:

## 2013-07-31 NOTE — Assessment & Plan Note (Signed)
Recent tracheitis/bronchitis responded to doxycycline. Now dependent on home ventilator and supplemental oxygen, probably permanently. She is severely debilitated and chronically ill after 7 months in hospitals. Her baseline condition was very poor before that. She benefits from home visits by her primary physician-Dr. Quillian QuinceBliss- but realistically prognosis is extremely poor and hospice should be a consideration.

## 2013-07-31 NOTE — Assessment & Plan Note (Addendum)
Tracheostomy 12/15/2012 for failure to wean. 6 Portex cuffed tube keeps  sliding out, requiring repositioning Home health nurse reports by mouth feedings appearing at trach stoma indicating failure to protect airway. She has a GI physician and referral is recommended. I think she should be made N.PO with all feedings through her PEG tube. Plan-referral to tracheostomy clinic anticipating replacement with larger cuff tube -6.5 or 7 probably. Extubation is not anticipated.

## 2013-07-31 NOTE — Patient Instructions (Addendum)
Order- Ref[facility] (Dr Carlynn HeraldFeinstein et al) to have # 6 Portex cuffed trach changed to a # 7 cuffed tube to reduce spontaneous extubation and to better protect airway from food at stoma.  Recommend she be evaluated by GI Dr Markham JordanElliot to consider changing to NPO with all feeding per PEG.  Order Portable CXR to be done same day as trach clinic visit   For dx chronic hypoxic respiratory failure

## 2013-08-01 ENCOUNTER — Telehealth: Payer: Self-pay | Admitting: Internal Medicine

## 2013-08-01 ENCOUNTER — Ambulatory Visit: Payer: Medicare Other | Admitting: Internal Medicine

## 2013-08-01 NOTE — Telephone Encounter (Signed)
All information has been faxed back as requested.

## 2013-08-01 NOTE — Telephone Encounter (Signed)
Spoke with Nurse  She is asking for orders on pt from 07/31/13 visit to be faxed to them at [phone]  [facility] her request

## 2013-08-07 ENCOUNTER — Telehealth: Payer: Self-pay | Admitting: Internal Medicine

## 2013-08-07 DIAGNOSIS — R1310 Dysphagia, unspecified: Secondary | ICD-10-CM

## 2013-08-07 DIAGNOSIS — Z930 Tracheostomy status: Secondary | ICD-10-CM

## 2013-08-07 NOTE — Telephone Encounter (Signed)
Ok to order speech therapy assisted modified barium swallow [facility], coordinated while transport is available for her other appointments for CXR and [facility].  Dx dysphagia, tracheostomy.

## 2013-08-07 NOTE — Telephone Encounter (Signed)
This modified barium swallow has been ordered.  Cala BradfordKimberly, RN, is aware.  Nothing further needed.

## 2013-08-07 NOTE — Telephone Encounter (Signed)
Called and spoke with Cala Bradfordkimberly, RN and she stated that the pt is scheduled at the trach clinic on 7/7 and is to have a cxr as well.  PCP recs for the pt to be NPO for now and give the glucerna 75 ml an hour x 12 hours per day.   They are wanting to know if CY will be willing to set the pt up for modified barium swallow to be done [facility] ([facility]  Her PCP feels that this test is needed.   CY would you be willing to order this for the pt.  Please advise. Thanks  Last ov--08/01/2013 Next ov--01/30/2014  Allergies  Allergen Reactions  . Codeine     REACTION: hallucinations  . Levofloxacin     REACTION: sores  . Morphine     REACTION: paralysis  . Pregabalin   . Sodium Benzoate     REACTION: unspecified  . Sulfonamide Derivatives     REACTION: anaphylactic    Current Outpatient Prescriptions on File Prior to Visit  Medication Sig Dispense Refill  . clonazePAM (KLONOPIN) 0.5 MG tablet Take 0.5 mg by mouth 2 (two) times daily as needed for anxiety.      . [employer] INSULIN SYRINGE .5CC/30G 30G X 1/2" 0.5 ML MISC USE AS DIRECTED  100 each  11  . dexamethasone (DECADRON) 2 MG tablet Take 2 mg by mouth daily.      . diazepam (VALIUM) 10 MG tablet Take 10 mg by mouth every 12 (twelve) hours as needed.        . diltiazem (CARDIZEM) 30 MG tablet Take 30 mg by mouth 3 (three) times daily.      . dorzolamide (TRUSOPT) 2 % ophthalmic solution Place 1 drop into the right eye 2 (two) times daily.      . famotidine (PEPCID) 20 MG tablet Take 20 mg by mouth 2 (two) times daily.      . fentaNYL (DURAGESIC - DOSED MCG/HR) 100 MCG/HR Place 1 patch onto the skin every 3 (three) days.        . ferrous sulfate 325 (65 FE) MG tablet Take 325 mg by mouth daily with breakfast.      . Insulin Detemir (LEVEMIR [place]) Inject into the skin.      Marland Kitchen. insulin lispro (HUMALOG) 100 UNIT/ML injection Inject 100 Units into the skin QID. Sliding scale      . INSULIN SYRINGE 1CC/29G (B-D INSULIN SYRINGE) 29G X 1/2" 1 ML  MISC by Does not apply route.        Marland Kitchen. ipratropium-albuterol (DUONEB) 0.5-2.5 (3) MG/3ML SOLN Take 3 mLs by nebulization every 6 (six) hours as needed.      Marland Kitchen. ketoconazole (NIZORAL) 2 % cream Apply 1 application topically 3 (three) times daily.        Marland Kitchen. LACTOBACILLUS PO Take 1 tablet by mouth 2 (two) times daily.      Marland Kitchen. latanoprost (XALATAN) 0.005 % ophthalmic solution Place 1 drop into both eyes at bedtime.      Marland Kitchen. latanoprost (XALATAN) 0.005 % ophthalmic solution Place 1 drop into both eyes at bedtime and may repeat dose one time if needed.      Marland Kitchen. levothyroxine (SYNTHROID, LEVOTHROID) 50 MCG tablet Take 37.5 mcg by mouth daily.       . meclizine (ANTIVERT) 25 MG tablet Take 25 mg by mouth 3 (three) times daily as needed.        . metoprolol succinate (TOPROL-XL) 50 MG  24 hr tablet Take 50 mg by mouth daily. Take with or immediately following a meal.      . nystatin (MYCOSTATIN) 100000 UNIT/ML suspension Take 500,000 Units by mouth 4 (four) times daily.      Marland Kitchen. oxyCODONE-acetaminophen (PERCOCET) 10-650 MG per tablet Take 1 tablet by mouth every 6 (six) hours as needed.        . potassium chloride 20 MEQ/15ML (10%) solution Take 20 mEq by mouth daily.      . promethazine (PHENERGAN) 25 MG tablet Take 25 mg by mouth every 8 (eight) hours as needed.        Marland Kitchen. Propylene Glycol (SYSTANE BALANCE) 0.6 % SOLN Apply 1 drop to eye 2 (two) times daily.       No current facility-administered medications on file prior to visit.

## 2013-08-11 ENCOUNTER — Other Ambulatory Visit (HOSPITAL_COMMUNITY): Payer: Self-pay | Admitting: Internal Medicine

## 2013-08-11 DIAGNOSIS — R1310 Dysphagia, unspecified: Secondary | ICD-10-CM

## 2013-08-15 ENCOUNTER — Ambulatory Visit (HOSPITAL_COMMUNITY)
Admission: RE | Admit: 2013-08-15 | Discharge: 2013-08-15 | Disposition: A | Discharge status: Home / Self Care | Payer: Medicare Other | Source: Ambulatory Visit | Attending: Pulmonary Disease | Admitting: Pulmonary Disease

## 2013-08-15 ENCOUNTER — Ambulatory Visit (HOSPITAL_COMMUNITY)
Admission: RE | Admit: 2013-08-15 | Discharge: 2013-08-15 | Disposition: A | Discharge status: Home / Self Care | Payer: Medicare Other | Source: Ambulatory Visit | Attending: Internal Medicine | Admitting: Internal Medicine

## 2013-08-15 ENCOUNTER — Inpatient Hospital Stay (HOSPITAL_COMMUNITY)
Admission: AD | Admit: 2013-08-15 | Discharge: 2013-08-29 | DRG: 207 | Disposition: A | Discharge status: Home Health | Payer: Medicare Other | Source: Ambulatory Visit | Attending: Pulmonary Disease | Admitting: Pulmonary Disease

## 2013-08-15 DIAGNOSIS — Z9911 Dependence on respirator [ventilator] status: Secondary | ICD-10-CM

## 2013-08-15 DIAGNOSIS — N390 Urinary tract infection, site not specified: Secondary | ICD-10-CM | POA: Diagnosis not present

## 2013-08-15 DIAGNOSIS — E039 Hypothyroidism, unspecified: Secondary | ICD-10-CM | POA: Diagnosis present

## 2013-08-15 DIAGNOSIS — Z87891 Personal history of nicotine dependence: Secondary | ICD-10-CM | POA: Diagnosis not present

## 2013-08-15 DIAGNOSIS — K219 Gastro-esophageal reflux disease without esophagitis: Secondary | ICD-10-CM | POA: Diagnosis present

## 2013-08-15 DIAGNOSIS — M353 Polymyalgia rheumatica: Secondary | ICD-10-CM

## 2013-08-15 DIAGNOSIS — N3000 Acute cystitis without hematuria: Secondary | ICD-10-CM

## 2013-08-15 DIAGNOSIS — R5381 Other malaise: Secondary | ICD-10-CM | POA: Diagnosis present

## 2013-08-15 DIAGNOSIS — J309 Allergic rhinitis, unspecified: Secondary | ICD-10-CM

## 2013-08-15 DIAGNOSIS — E119 Type 2 diabetes mellitus without complications: Secondary | ICD-10-CM | POA: Diagnosis present

## 2013-08-15 DIAGNOSIS — IMO0001 Reserved for inherently not codable concepts without codable children: Secondary | ICD-10-CM | POA: Diagnosis present

## 2013-08-15 DIAGNOSIS — L89109 Pressure ulcer of unspecified part of back, unspecified stage: Secondary | ICD-10-CM | POA: Diagnosis present

## 2013-08-15 DIAGNOSIS — Z6841 Body Mass Index (BMI) 40.0 and over, adult: Secondary | ICD-10-CM

## 2013-08-15 DIAGNOSIS — G4733 Obstructive sleep apnea (adult) (pediatric): Secondary | ICD-10-CM

## 2013-08-15 DIAGNOSIS — N3001 Acute cystitis with hematuria: Secondary | ICD-10-CM

## 2013-08-15 DIAGNOSIS — E662 Morbid (severe) obesity with alveolar hypoventilation: Secondary | ICD-10-CM

## 2013-08-15 DIAGNOSIS — D649 Anemia, unspecified: Secondary | ICD-10-CM | POA: Diagnosis present

## 2013-08-15 DIAGNOSIS — Z930 Tracheostomy status: Secondary | ICD-10-CM

## 2013-08-15 DIAGNOSIS — Z801 Family history of malignant neoplasm of trachea, bronchus and lung: Secondary | ICD-10-CM | POA: Diagnosis not present

## 2013-08-15 DIAGNOSIS — Z885 Allergy status to narcotic agent status: Secondary | ICD-10-CM | POA: Diagnosis not present

## 2013-08-15 DIAGNOSIS — J9601 Acute respiratory failure with hypoxia: Secondary | ICD-10-CM

## 2013-08-15 DIAGNOSIS — Z800 Family history of malignant neoplasm of digestive organs: Secondary | ICD-10-CM | POA: Diagnosis not present

## 2013-08-15 DIAGNOSIS — IMO0002 Reserved for concepts with insufficient information to code with codable children: Secondary | ICD-10-CM

## 2013-08-15 DIAGNOSIS — J9620 Acute and chronic respiratory failure, unspecified whether with hypoxia or hypercapnia: Secondary | ICD-10-CM | POA: Diagnosis present

## 2013-08-15 DIAGNOSIS — N760 Acute vaginitis: Secondary | ICD-10-CM

## 2013-08-15 DIAGNOSIS — J9382 Other air leak: Secondary | ICD-10-CM | POA: Diagnosis present

## 2013-08-15 DIAGNOSIS — I2510 Atherosclerotic heart disease of native coronary artery without angina pectoris: Secondary | ICD-10-CM | POA: Diagnosis present

## 2013-08-15 DIAGNOSIS — R1310 Dysphagia, unspecified: Secondary | ICD-10-CM

## 2013-08-15 DIAGNOSIS — J45909 Unspecified asthma, uncomplicated: Secondary | ICD-10-CM

## 2013-08-15 DIAGNOSIS — Z931 Gastrostomy status: Secondary | ICD-10-CM

## 2013-08-15 DIAGNOSIS — R079 Chest pain, unspecified: Secondary | ICD-10-CM

## 2013-08-15 DIAGNOSIS — E876 Hypokalemia: Secondary | ICD-10-CM | POA: Diagnosis not present

## 2013-08-15 DIAGNOSIS — I10 Essential (primary) hypertension: Secondary | ICD-10-CM | POA: Diagnosis present

## 2013-08-15 DIAGNOSIS — R29818 Other symptoms and signs involving the nervous system: Secondary | ICD-10-CM

## 2013-08-15 DIAGNOSIS — M1990 Unspecified osteoarthritis, unspecified site: Secondary | ICD-10-CM

## 2013-08-15 DIAGNOSIS — Z882 Allergy status to sulfonamides status: Secondary | ICD-10-CM | POA: Diagnosis not present

## 2013-08-15 DIAGNOSIS — J9503 Malfunction of tracheostomy stoma: Secondary | ICD-10-CM | POA: Diagnosis present

## 2013-08-15 DIAGNOSIS — L8990 Pressure ulcer of unspecified site, unspecified stage: Secondary | ICD-10-CM | POA: Diagnosis present

## 2013-08-15 DIAGNOSIS — R1311 Dysphagia, oral phase: Secondary | ICD-10-CM | POA: Diagnosis present

## 2013-08-15 DIAGNOSIS — I469 Cardiac arrest, cause unspecified: Secondary | ICD-10-CM

## 2013-08-15 DIAGNOSIS — J42 Unspecified chronic bronchitis: Secondary | ICD-10-CM

## 2013-08-15 DIAGNOSIS — E678 Other specified hyperalimentation: Secondary | ICD-10-CM

## 2013-08-15 DIAGNOSIS — Z794 Long term (current) use of insulin: Secondary | ICD-10-CM

## 2013-08-15 DIAGNOSIS — R092 Respiratory arrest: Secondary | ICD-10-CM | POA: Diagnosis not present

## 2013-08-15 DIAGNOSIS — J9610 Chronic respiratory failure, unspecified whether with hypoxia or hypercapnia: Secondary | ICD-10-CM

## 2013-08-15 DIAGNOSIS — K2100 Gastro-esophageal reflux disease with esophagitis, without bleeding: Secondary | ICD-10-CM

## 2013-08-15 DIAGNOSIS — J9611 Chronic respiratory failure with hypoxia: Secondary | ICD-10-CM

## 2013-08-15 DIAGNOSIS — J690 Pneumonitis due to inhalation of food and vomit: Secondary | ICD-10-CM | POA: Insufficient documentation

## 2013-08-15 DIAGNOSIS — L538 Other specified erythematous conditions: Secondary | ICD-10-CM

## 2013-08-15 DIAGNOSIS — T17908D Unspecified foreign body in respiratory tract, part unspecified causing other injury, subsequent encounter: Secondary | ICD-10-CM

## 2013-08-15 DIAGNOSIS — E785 Hyperlipidemia, unspecified: Secondary | ICD-10-CM

## 2013-08-15 DIAGNOSIS — Z888 Allergy status to other drugs, medicaments and biological substances status: Secondary | ICD-10-CM | POA: Diagnosis not present

## 2013-08-15 DIAGNOSIS — R0902 Hypoxemia: Secondary | ICD-10-CM

## 2013-08-15 DIAGNOSIS — M810 Age-related osteoporosis without current pathological fracture: Secondary | ICD-10-CM

## 2013-08-15 DIAGNOSIS — R1313 Dysphagia, pharyngeal phase: Secondary | ICD-10-CM | POA: Diagnosis present

## 2013-08-15 DIAGNOSIS — Z860100 Personal history of colon polyps, unspecified: Secondary | ICD-10-CM

## 2013-08-15 DIAGNOSIS — R609 Edema, unspecified: Secondary | ICD-10-CM

## 2013-08-15 DIAGNOSIS — T17908A Unspecified foreign body in respiratory tract, part unspecified causing other injury, initial encounter: Secondary | ICD-10-CM

## 2013-08-15 DIAGNOSIS — T17908S Unspecified foreign body in respiratory tract, part unspecified causing other injury, sequela: Secondary | ICD-10-CM

## 2013-08-15 DIAGNOSIS — J9509 Other tracheostomy complication: Secondary | ICD-10-CM | POA: Diagnosis present

## 2013-08-15 LAB — PHOSPHORUS: Phosphorus: 3.1 mg/dL (ref 2.3–4.6)

## 2013-08-15 LAB — COMPREHENSIVE METABOLIC PANEL
ALT: 35 U/L (ref 0–35)
ANION GAP: 13 (ref 5–15)
AST: 26 U/L (ref 0–37)
Albumin: 2.9 g/dL — ABNORMAL LOW (ref 3.5–5.2)
Alkaline Phosphatase: 89 U/L (ref 39–117)
BUN: 15 mg/dL (ref 6–23)
CO2: 32 mEq/L (ref 19–32)
CREATININE: 0.31 mg/dL — AB (ref 0.50–1.10)
Calcium: 9.5 mg/dL (ref 8.4–10.5)
Chloride: 89 mEq/L — ABNORMAL LOW (ref 96–112)
GFR calc Af Amer: >90 mL/min (ref >90)
GFR calc non Af Amer: >90 mL/min (ref >90)
GLUCOSE: 125 mg/dL — AB (ref 70–99)
Potassium: 3.8 mEq/L (ref 3.7–5.3)
Sodium: 134 mEq/L — ABNORMAL LOW (ref 137–147)
Total Bilirubin: <0.2 mg/dL — ABNORMAL LOW (ref 0.3–1.2)
Total Protein: 6.1 g/dL (ref 6.0–8.3)

## 2013-08-15 LAB — CBC WITH DIFFERENTIAL/PLATELET
BASOS ABS: 0 10*3/uL (ref 0.0–0.1)
BASOS PCT: 0 % (ref 0–1)
EOS ABS: 0 10*3/uL (ref 0.0–0.7)
EOS PCT: 0 % (ref 0–5)
HEMATOCRIT: 35.3 % — AB (ref 36.0–46.0)
HEMOGLOBIN: 11.1 g/dL — AB (ref 12.0–15.0)
Lymphocytes Relative: 24 % (ref 12–46)
Lymphs Abs: 2.6 10*3/uL (ref 0.7–4.0)
MCH: 30.7 pg (ref 26.0–34.0)
MCHC: 31.4 g/dL (ref 30.0–36.0)
MCV: 97.5 fL (ref 78.0–100.0)
MONOS PCT: 10 % (ref 3–12)
Monocytes Absolute: 1.1 10*3/uL — ABNORMAL HIGH (ref 0.1–1.0)
Neutro Abs: 7.1 10*3/uL (ref 1.7–7.7)
Neutrophils Relative %: 65 % (ref 43–77)
Platelets: 371 10*3/uL (ref 150–400)
RBC: 3.62 MIL/uL — ABNORMAL LOW (ref 3.87–5.11)
RDW: 15 % (ref 11.5–15.5)
WBC: 10.9 10*3/uL — ABNORMAL HIGH (ref 4.0–10.5)

## 2013-08-15 LAB — MAGNESIUM: Magnesium: 2.2 mg/dL (ref 1.5–2.5)

## 2013-08-15 LAB — TSH: TSH: 2.23 u[IU]/mL (ref 0.350–4.500)

## 2013-08-15 LAB — URINALYSIS, ROUTINE W REFLEX MICROSCOPIC
Bilirubin Urine: NEGATIVE
Glucose, UA: NEGATIVE mg/dL
Hgb urine dipstick: NEGATIVE
KETONES UR: NEGATIVE mg/dL
NITRITE: POSITIVE — AB
PH: 7 (ref 5.0–8.0)
PROTEIN: NEGATIVE mg/dL
Specific Gravity, Urine: 1.012 (ref 1.005–1.030)
Urobilinogen, UA: 0.2 mg/dL (ref 0.0–1.0)

## 2013-08-15 LAB — GLUCOSE, CAPILLARY
GLUCOSE-CAPILLARY: 130 mg/dL — AB (ref 70–99)
Glucose-Capillary: 135 mg/dL — ABNORMAL HIGH (ref 70–99)

## 2013-08-15 LAB — PROTIME-INR
INR: 1 (ref 0.00–1.49)
Prothrombin Time: 13.2 seconds (ref 11.6–15.2)

## 2013-08-15 LAB — URINE MICROSCOPIC-ADD ON

## 2013-08-15 LAB — APTT: APTT: 22 s — AB (ref 24–37)

## 2013-08-15 LAB — PROCALCITONIN: Procalcitonin: 0.16 ng/mL

## 2013-08-15 LAB — MRSA PCR SCREENING: MRSA by PCR: POSITIVE — AB

## 2013-08-15 MED ORDER — DILTIAZEM HCL 30 MG PO TABS
30.0000 mg | ORAL_TABLET | Freq: Three times a day (TID) | ORAL | Status: DC
  Administered 2013-08-15 – 2013-08-17 (×5): 30 mg via ORAL
  Filled 2013-08-15 (×8): qty 1

## 2013-08-15 MED ORDER — SODIUM CHLORIDE 0.9 % IV SOLN
INTRAVENOUS | Status: DC
  Administered 2013-08-15: 1000 mL via INTRAVENOUS
  Administered 2013-08-16: 05:00:00 via INTRAVENOUS

## 2013-08-15 MED ORDER — METOPROLOL TARTRATE 25 MG/10 ML ORAL SUSPENSION
50.0000 mg | Freq: Two times a day (BID) | ORAL | Status: DC
  Administered 2013-08-15 – 2013-08-29 (×23): 50 mg
  Filled 2013-08-15 (×31): qty 20

## 2013-08-15 MED ORDER — FENTANYL 50 MCG/HR TD PT72
100.0000 ug | MEDICATED_PATCH | TRANSDERMAL | Status: DC
  Administered 2013-08-15 – 2013-08-27 (×6): 100 ug via TRANSDERMAL
  Filled 2013-08-15: qty 2
  Filled 2013-08-15: qty 1
  Filled 2013-08-15 (×6): qty 2

## 2013-08-15 MED ORDER — LEVOTHYROXINE SODIUM 50 MCG PO TABS
50.0000 ug | ORAL_TABLET | Freq: Every day | ORAL | Status: DC
  Filled 2013-08-15: qty 1

## 2013-08-15 MED ORDER — INSULIN ASPART 100 UNIT/ML ~~LOC~~ SOLN
2.0000 [IU] | SUBCUTANEOUS | Status: DC
  Administered 2013-08-15 – 2013-08-16 (×3): 2 [IU] via SUBCUTANEOUS
  Administered 2013-08-16: 6 [IU] via SUBCUTANEOUS
  Administered 2013-08-16 (×4): 4 [IU] via SUBCUTANEOUS
  Administered 2013-08-17 (×2): 6 [IU] via SUBCUTANEOUS

## 2013-08-15 MED ORDER — HEPARIN SODIUM (PORCINE) 5000 UNIT/ML IJ SOLN
5000.0000 [IU] | Freq: Three times a day (TID) | INTRAMUSCULAR | Status: DC
  Administered 2013-08-15 – 2013-08-29 (×42): 5000 [IU] via SUBCUTANEOUS
  Filled 2013-08-15 (×45): qty 1

## 2013-08-15 MED ORDER — PANTOPRAZOLE SODIUM 40 MG IV SOLR
40.0000 mg | INTRAVENOUS | Status: DC
  Administered 2013-08-15 – 2013-08-16 (×2): 40 mg via INTRAVENOUS
  Filled 2013-08-15 (×3): qty 40

## 2013-08-15 MED ORDER — ACETAMINOPHEN 160 MG/5ML PO SOLN
650.0000 mg | Freq: Four times a day (QID) | ORAL | Status: DC | PRN
  Administered 2013-08-17 – 2013-08-28 (×7): 650 mg
  Filled 2013-08-15 (×8): qty 20.3

## 2013-08-15 MED ORDER — DILTIAZEM HCL 30 MG PO TABS: 30.0000 mg | ORAL_TABLET | Freq: Three times a day (TID) | ORAL | Status: DC

## 2013-08-15 MED ORDER — IPRATROPIUM-ALBUTEROL 0.5-2.5 (3) MG/3ML IN SOLN
3.0000 mL | RESPIRATORY_TRACT | Status: DC
  Administered 2013-08-15 – 2013-08-26 (×63): 3 mL via RESPIRATORY_TRACT
  Filled 2013-08-15 (×63): qty 3

## 2013-08-15 MED ORDER — CLONAZEPAM 0.1 MG/ML ORAL SUSPENSION: 0.5000 mg | Freq: Two times a day (BID) | ORAL | Status: DC | PRN

## 2013-08-15 MED ORDER — IPRATROPIUM-ALBUTEROL 0.5-2.5 (3) MG/3ML IN SOLN: 3.0000 mL | RESPIRATORY_TRACT | Status: DC

## 2013-08-15 MED ORDER — IPRATROPIUM-ALBUTEROL 20-100 MCG/ACT IN AERS: 6.0000 | INHALATION_SPRAY | RESPIRATORY_TRACT | Status: DC

## 2013-08-15 MED ORDER — DEXAMETHASONE 1 MG/ML PO CONC
2.0000 mg | Freq: Every day | ORAL | Status: DC
  Administered 2013-08-15 – 2013-08-17 (×3): 2 mg
  Filled 2013-08-15 (×3): qty 2

## 2013-08-15 MED ORDER — CHLORHEXIDINE GLUCONATE 0.12 % MT SOLN
15.0000 mL | Freq: Two times a day (BID) | OROMUCOSAL | Status: DC
  Administered 2013-08-15 – 2013-08-16 (×2): 15 mL via OROMUCOSAL
  Filled 2013-08-15 (×2): qty 15

## 2013-08-15 MED ORDER — CLONAZEPAM 0.5 MG PO TBDP
0.5000 mg | ORAL_TABLET | Freq: Two times a day (BID) | ORAL | Status: DC | PRN
  Administered 2013-08-16 – 2013-08-17 (×2): 0.5 mg via ORAL
  Filled 2013-08-15 (×2): qty 1

## 2013-08-15 NOTE — Consult Note (Signed)
Name: [name] MRN: [id number] DOB: 1945-06-24    ADMISSION DATE:  08/15/2013 CONSULTATION DATE:  08/15/2013  REFERRING MD :  Dr. Maple Hudson PRIMARY SERVICE:  PCCM  CHIEF COMPLAINT:  Trach management and air leak around the trach  BRIEF PATIENT DESCRIPTION: 68 year old female with tracheostomy placed in November 2014 in [facility] after an transfer from [facility] from an admission in 11/2012 with PNA and VDRF.  Patient was trached and was sent home on a home vent on SIMV.  Patient followed with Dr. Maple Hudson where it was noted that the patient has significant leak from the trach site and patient was sent to the trach clinic for dilation and placement of a longer and/or larger trach to address leak.  Of note is that the patient also has been aspirating (she evidently passed a swallow evaluation has been taking PO).  LINES / TUBES: Trach 11/14>>>  PAST MEDICAL HISTORY :  Past Medical History  Diagnosis Date  . Fibromyalgia   . Osteoarthritis   . Asthma   . Type II or unspecified type diabetes mellitus without mention of complication, not stated as uncontrolled   . Esophageal reflux   . Allergic rhinitis, cause unspecified   . OSA on CPAP   . CAD (coronary artery disease)   . Other and unspecified hyperlipidemia    Past Surgical History  Procedure Laterality Date  . Vesicovaginal fistula closure w/ tah    . Tonsillectomy    . Eye surgery    . Tracheostomy tube placement N/A 12/15/2012    Procedure: TRACHEOSTOMY;  Surgeon: Drema Halon, MD;  Location: [facility] OR;  Service: ENT;  Laterality: N/A;  . Tee without cardioversion N/A 01/20/2013    Procedure: TRANSESOPHAGEAL ECHOCARDIOGRAM (TEE);  Surgeon: Vesta Mixer, MD;  Location: [facility] ENDOSCOPY;  Service: Cardiovascular;  Laterality: N/A;   Prior to Admission medications   Medication Sig Start Date End Date Taking? Authorizing Provider  clonazePAM (KLONOPIN) 0.5 MG tablet Take 0.5 mg by mouth 2 (two) times daily as needed for anxiety.     Historical Provider, MD  [employer] INSULIN SYRINGE .5CC/30G 30G X 1/2" 0.5 ML MISC USE AS DIRECTED 12/28/10   Karie Schwalbe, MD  dexamethasone (DECADRON) 2 MG tablet Take 2 mg by mouth daily.    Historical Provider, MD  diazepam (VALIUM) 10 MG tablet Take 10 mg by mouth every 12 (twelve) hours as needed.      Historical Provider, MD  diltiazem (CARDIZEM) 30 MG tablet Take 30 mg by mouth 3 (three) times daily.    Historical Provider, MD  dorzolamide (TRUSOPT) 2 % ophthalmic solution Place 1 drop into the right eye 2 (two) times daily.    Historical Provider, MD  famotidine (PEPCID) 20 MG tablet Take 20 mg by mouth 2 (two) times daily.    Historical Provider, MD  fentaNYL (DURAGESIC - DOSED MCG/HR) 100 MCG/HR Place 1 patch onto the skin every 3 (three) days.      Historical Provider, MD  ferrous sulfate 325 (65 FE) MG tablet Take 325 mg by mouth daily with breakfast.    Historical Provider, MD  Insulin Detemir (LEVEMIR [place]) Inject into the skin.    Historical Provider, MD  insulin lispro (HUMALOG) 100 UNIT/ML injection Inject 100 Units into the skin QID. Sliding scale    Historical Provider, MD  INSULIN SYRINGE 1CC/29G (B-D INSULIN SYRINGE) 29G X 1/2" 1 ML MISC by Does not apply route.      Historical Provider, MD  ipratropium-albuterol (DUONEB) 0.5-2.5 (3) MG/3ML SOLN Take 3 mLs by nebulization every 6 (six) hours as needed.    Historical Provider, MD  ketoconazole (NIZORAL) 2 % cream Apply 1 application topically 3 (three) times daily.      Historical Provider, MD  LACTOBACILLUS PO Take 1 tablet by mouth 2 (two) times daily.    Historical Provider, MD  latanoprost (XALATAN) 0.005 % ophthalmic solution Place 1 drop into both eyes at bedtime.    Historical Provider, MD  latanoprost (XALATAN) 0.005 % ophthalmic solution Place 1 drop into both eyes at bedtime and may repeat dose one time if needed.    Historical Provider, MD  levothyroxine (SYNTHROID, LEVOTHROID) 50 MCG tablet Take 37.5 mcg by mouth daily.      Historical Provider, MD  meclizine (ANTIVERT) 25 MG tablet Take 25 mg by mouth 3 (three) times daily as needed.      Historical Provider, MD  metoprolol succinate (TOPROL-XL) 50 MG 24 hr tablet Take 50 mg by mouth daily. Take with or immediately following a meal.    Historical Provider, MD  nystatin (MYCOSTATIN) 100000 UNIT/ML suspension Take 500,000 Units by mouth 4 (four) times daily.    Historical Provider, MD  oxyCODONE-acetaminophen (PERCOCET) 10-650 MG per tablet Take 1 tablet by mouth every 6 (six) hours as needed.      Historical Provider, MD  potassium chloride 20 MEQ/15ML (10%) solution Take 20 mEq by mouth daily.    Historical Provider, MD  promethazine (PHENERGAN) 25 MG tablet Take 25 mg by mouth every 8 (eight) hours as needed.      Historical Provider, MD  Propylene Glycol (SYSTANE BALANCE) 0.6 % SOLN Apply 1 drop to eye 2 (two) times daily.    Historical Provider, MD   Allergies  Allergen Reactions  . Codeine     REACTION: hallucinations  . Levofloxacin     REACTION: sores  . Morphine     REACTION: paralysis  . Pregabalin   . Sodium Benzoate     REACTION: unspecified  . Sulfonamide Derivatives     REACTION: anaphylactic    FAMILY HISTORY:  Family History  Problem Relation Age of Onset  . Pancreatic cancer Brother   . Lung cancer Sister   . Colon cancer Maternal Grandfather   . Diabetes Father    SOCIAL HISTORY:  reports that she quit smoking about 21 years ago. Her smoking use included Cigarettes. She has a 12 pack-year smoking history. She has never used smokeless tobacco. She reports that she does not drink alcohol. Her drug history is not on file.  REVIEW OF SYSTEMS:   Unable to attain, on vent with trach, non-verbal.  SUBJECTIVE: Non-verbal.  VITAL SIGNS:  VSS-AF.  PHYSICAL EXAMINATION: General:  Chronically ill appearing morbidly obese female, NAD, audible leak around tracheostomy specially when coughing. Neuro:  Arousable, following commands but  very lethargic. HEENT:  [place]/AT, PERRL, EOM-I and MMM. Neck:  Supple, -LAN and -thyromegally, trach site clean. Cardiovascular:  RRR, Nl S1/S2, -M/R/G. Lungs:  Coarse BS diffusely. Abdomen:  Soft, NT, ND and +BS. Musculoskeletal:  1+ edema bilat LE. Skin:  Multiple areas of echemosis   No results found for this basename: NA, K, CL, CO2, BUN, CREATININE, GLUCOSE,  in the last 168 hours No results found for this basename: HGB, HCT, WBC, PLT,  in the last 168 hours No results found.  ASSESSMENT / PLAN:  68 year old female with COPD, OSA, OHV and CHF presenting with a tracheostomy on vent.  Dr. Maple HudsonYoung requests that we up-size her tracheostomy at the trach clinic.  There is clearly an audible leak and patient is aspirating as evident by the fact that there is food matter coming out of her trach with suction.   Plan: - Admit patient to the hospital. - Place on PRVC overnight. - Make NPO. - Check coags in AM. - Plan for dilation in AM to change tracheostomy.  Alyson ReedyWesam G. Theodis Kinsel, M.D. [facility]. Pager: [phone]. After hours pager: [phone].  08/15/2013, 4:09 PM

## 2013-08-15 NOTE — Procedures (Signed)
Objective Swallowing Evaluation: Modified Barium Swallowing Study  Patient Details  Name: [name] MRN: [id number] Date of Birth: 04/22/1945  Today's Date: 08/15/2013 Time: 1430-1500 SLP Time Calculation (min): 30 min  Past Medical History:  Past Medical History  Diagnosis Date  . Fibromyalgia   . Osteoarthritis   . Asthma   . Type II or unspecified type diabetes mellitus without mention of complication, not stated as uncontrolled   . Esophageal reflux   . Allergic rhinitis, cause unspecified   . OSA on CPAP   . CAD (coronary artery disease)   . Other and unspecified hyperlipidemia    Past Surgical History:  Past Surgical History  Procedure Laterality Date  . Vesicovaginal fistula closure w/ tah    . Tonsillectomy    . Eye surgery    . Tracheostomy tube placement N/A 12/15/2012    Procedure: TRACHEOSTOMY;  Surgeon: Drema Halonhristopher E Newman, MD;  [facility];  Service: ENT;  Laterality: N/A;  . Tee without cardioversion N/A 01/20/2013    Procedure: TRANSESOPHAGEAL ECHOCARDIOGRAM (TEE);  Surgeon: Vesta MixerPhilip J Nahser, MD;  [facility];  Service: Cardiovascular;  Laterality: N/A;   HPI:  [age] yoF former smoker, followed for asthma/ bronchitis, allergic rhinitis, Sleep apnea, OHS complicated by GERD, DM, polymyalgia rheumatica and CAD. 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 Pt was  referred by Dr. Maple HudsonYoung for OP MBS due to exacerbation of trachel infection with green discharge, possibly related to food being found in trach stoma or during suction. Pt has been made NPO and feeding is restricted to PEG. Communication with pt is extremely difficult given vent without PMSV and pt is unable to write due to right shoulder pain. Pt arrived with paramedic transport, accompanied by home health RN and husband. She is on a portable vent set to SIMV.      Assessment / Plan / Recommendation Clinical Impression  Dysphagia Diagnosis: Moderate pharyngeal phase dysphagia Clinical  impression: Very difficult exam; pt not appropriate for MBS primairly due to body habitus. SLP unable to visualize pts airway due to position of shoulders obscuring pts airway. Pt also unable to phonate or cough/clear throat due to vent with cuffed trach. Pt denies ever having attempted in-line PMSV. Proceeded with attempt at exam to determine if any aspiraiton could be identified given pts difficult transport here and desire to eat/drink.   Pt did demosntrate a moderate and possibly severe respiratory based oropahryngeal dysphagia. Pt has incomplete airway closure during the swallow despite adequate strength of musculature. Timing of swallow also slightly delayed. With thin liquids via straw (pts preferred method of intake) she was clearly seen to have significant silent penetration before/during the swallow. Depth of aspiration could not be seen. With all other trials and consistencies penetration and aspiration was probably and could not be ruled out. Pt appeared to have reduced penetration with teaspoon sized boluses which were captured in the valleculae prior to the swallow, allowing for better airway closure before the bolus passed. Again, even with this strategy, aspiration could not be ruled out.   Pt and Husband want to know if pt can eat or drink. Informed them of high risk of aspiration with all POs but also educated them that if they choose to try PO, limited teaspoon sips and purees are best. Pt and husband will need to discuss findings with MD to determine best course of action. Recommend considering a FEES (fiberoptic endoscopic evaluation of swallowing) which is a much better test given  this pts body habitus. A FEES [facility] as an outpatient, but could possibly be done by a traveling FEES clinician closer to pts home. Also recommend pt be evaluated for an inline PMSV with a qualified home health SLP (or during pts next trach clinic visit) to improve ability to  communicate and protect airway with PO.     Treatment Recommendation       Diet Recommendation NPO   Liquid Administration via:  (if consuming with known risk - teaspoon thin/puree) Medication Administration: Via alternative means    Other  Recommendations Recommended Consults: FEES   Follow Up Recommendations  Home health SLP    Frequency and Duration        Pertinent Vitals/Pain NA    SLP Swallow Goals     General HPI: [age] yoF former smoker, followed for asthma/ bronchitis, allergic rhinitis, Sleep apnea, OHS complicated by GERD, DM, polymyalgia rheumatica and CAD. 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 Pt was  referred by Dr. Maple HudsonYoung for OP MBS due to exacerbation of trachel infection with green discharge, possibly related to food being found in trach stoma or during suction. Pt has been made NPO and feeding is restricted to PEG. Communication with pt is extremely difficult given vent without PMSV and pt is unable to write due to right shoulder pain. Pt arrived with paramedic transport, accompanied by home health RN and husband. She is on a portable vent set to SIMV.  Type of Study: Modified Barium Swallowing Study Reason for Referral: Objectively evaluate swallowing function Previous Swallow Assessment: none in records Diet Prior to this Study: NPO;PEG tube Temperature Spikes Noted: No Respiratory Status: Ventilator History of Recent Intubation: No Behavior/Cognition: Alert;Cooperative;Pleasant mood Oral Cavity - Dentition: Edentulous Oral Motor / Sensory Function: Within functional limits Self-Feeding Abilities: Needs assist Patient Positioning: Upright in chair Baseline Vocal Quality: Aphonic (unable to phonate due to ventilator and cuff) Volitional Cough: Other (Comment) (unable due to vent) Volitional Swallow: Able to elicit Anatomy: Other (Comment) (very small oropharynx) Pharyngeal Secretions: Not observed secondary MBS    Reason for Referral Objectively  evaluate swallowing function   Oral Phase Oral Preparation/Oral Phase Oral Phase: WFL (with liquids and puree, solids not tested pt edentulous)   Pharyngeal Phase Pharyngeal Phase Pharyngeal Phase: Impaired Pharyngeal - Nectar Pharyngeal - Nectar Teaspoon: Delayed swallow initiation;Reduced epiglottic inversion;Reduced airway/laryngeal closure Pharyngeal - Nectar Straw: Delayed swallow initiation;Reduced epiglottic inversion;Reduced airway/laryngeal closure Pharyngeal - Thin Pharyngeal - Thin Teaspoon: Delayed swallow initiation;Reduced epiglottic inversion;Reduced airway/laryngeal closure Pharyngeal - Thin Straw: Delayed swallow initiation;Reduced epiglottic inversion;Reduced airway/laryngeal closure;Moderate aspiration;Penetration/Aspiration before swallow;Penetration/Aspiration during swallow Penetration/Aspiration details (thin straw):  (unable to visualize due to body habitus)  Cervical Esophageal Phase    GO    Cervical Esophageal Phase Cervical Esophageal Phase:  (unable to visualize due to body habitus)    Functional Assessment Tool Used: clinical judgement Functional Limitations: Swallowing Swallow Current Status ([id number]): At least 60 percent but less than 80 percent impaired, limited or restricted Swallow Goal Status [phone]([id number]): At least 60 percent but less than 80 percent impaired, limited or restricted Swallow Discharge Status [phone]([id number]): At least 60 percent but less than 80 percent impaired, limited or restricted   [facility] HospitalBonnie Audry [place], MA CCC-SLP [phone]  Claudine MoutonDeBlois, Latoya Maulding Caroline 08/15/2013, 4:09 PM

## 2013-08-15 NOTE — H&P (Signed)
PULMONARY / CRITICAL CARE MEDICINE   Name: [name] MRN: [id number] DOB: 1945-03-26    ADMISSION DATE:  (Not on file) CONSULTATION DATE:  08/15/2013  REFERRING MD :  [facility] PRIMARY SERVICE: PCCM  CHIEF COMPLAINT:  Tracheostomy leak and food matter in the tracheostomy.  BRIEF PATIENT DESCRIPTION: 68 year old female with tracheostomy placed in November 2014 in [facility] after an transfer from [facility] from an admission in 11/2012 with PNA and VDRF.  Patient was see in the trach clinic and was noted to have a significant leak from the trach site but we were not going to be able to dilate patient's trach in the clinic therefore the patient's husband decided to agree to admit patient for said procedure. Of note is that the patient also has been aspirating (she evidently passed a swallow evaluation has been taking PO).  SIGNIFICANT EVENTS / STUDIES:  7/7 air leak from trach site with food material in the trach site.  LINES / TUBES: Trach 11/2012  CULTURES: Blood 08/15/2013>>> Urine 08/15/2013>>> Sputum 08/15/2013>>>  ANTIBIOTICS: None  PAST MEDICAL HISTORY :  Past Medical History  Diagnosis Date  . Fibromyalgia   . Osteoarthritis   . Asthma   . Type II or unspecified type diabetes mellitus without mention of complication, not stated as uncontrolled   . Esophageal reflux   . Allergic rhinitis, cause unspecified   . OSA on CPAP   . CAD (coronary artery disease)   . Other and unspecified hyperlipidemia    Past Surgical History  Procedure Laterality Date  . Vesicovaginal fistula closure w/ tah    . Tonsillectomy    . Eye surgery    . Tracheostomy tube placement N/A 12/15/2012    Procedure: TRACHEOSTOMY;  Surgeon: Drema Halon, MD;  Location: [facility] OR;  Service: ENT;  Laterality: N/A;  . Tee without cardioversion N/A 01/20/2013    Procedure: TRANSESOPHAGEAL ECHOCARDIOGRAM (TEE);  Surgeon: Vesta Mixer, MD;  Location: [facility] ENDOSCOPY;  Service: Cardiovascular;  Laterality: N/A;    Prior to Admission medications   Medication Sig Start Date End Date Taking? Authorizing Provider  clonazePAM (KLONOPIN) 0.5 MG tablet Take 0.5 mg by mouth 2 (two) times daily as needed for anxiety.    Historical Provider, MD  [employer] INSULIN SYRINGE .5CC/30G 30G X 1/2" 0.5 ML MISC USE AS DIRECTED 12/28/10   Karie Schwalbe, MD  dexamethasone (DECADRON) 2 MG tablet Take 2 mg by mouth daily.    Historical Provider, MD  diazepam (VALIUM) 10 MG tablet Take 10 mg by mouth every 12 (twelve) hours as needed.      Historical Provider, MD  diltiazem (CARDIZEM) 30 MG tablet Take 30 mg by mouth 3 (three) times daily.    Historical Provider, MD  dorzolamide (TRUSOPT) 2 % ophthalmic solution Place 1 drop into the right eye 2 (two) times daily.    Historical Provider, MD  famotidine (PEPCID) 20 MG tablet Take 20 mg by mouth 2 (two) times daily.    Historical Provider, MD  fentaNYL (DURAGESIC - DOSED MCG/HR) 100 MCG/HR Place 1 patch onto the skin every 3 (three) days.      Historical Provider, MD  ferrous sulfate 325 (65 FE) MG tablet Take 325 mg by mouth daily with breakfast.    Historical Provider, MD  Insulin Detemir (LEVEMIR [place]) Inject into the skin.    Historical Provider, MD  insulin lispro (HUMALOG) 100 UNIT/ML injection Inject 100 Units into the skin QID. Sliding scale  Historical Provider, MD  INSULIN SYRINGE 1CC/29G (B-D INSULIN SYRINGE) 29G X 1/2" 1 ML MISC by Does not apply route.      Historical Provider, MD  ipratropium-albuterol (DUONEB) 0.5-2.5 (3) MG/3ML SOLN Take 3 mLs by nebulization every 6 (six) hours as needed.    Historical Provider, MD  ketoconazole (NIZORAL) 2 % cream Apply 1 application topically 3 (three) times daily.      Historical Provider, MD  LACTOBACILLUS PO Take 1 tablet by mouth 2 (two) times daily.    Historical Provider, MD  latanoprost (XALATAN) 0.005 % ophthalmic solution Place 1 drop into both eyes at bedtime.    Historical Provider, MD  latanoprost (XALATAN) 0.005 %  ophthalmic solution Place 1 drop into both eyes at bedtime and may repeat dose one time if needed.    Historical Provider, MD  levothyroxine (SYNTHROID, LEVOTHROID) 50 MCG tablet Take 37.5 mcg by mouth daily.     Historical Provider, MD  meclizine (ANTIVERT) 25 MG tablet Take 25 mg by mouth 3 (three) times daily as needed.      Historical Provider, MD  metoprolol succinate (TOPROL-XL) 50 MG 24 hr tablet Take 50 mg by mouth daily. Take with or immediately following a meal.    Historical Provider, MD  nystatin (MYCOSTATIN) 100000 UNIT/ML suspension Take 500,000 Units by mouth 4 (four) times daily.    Historical Provider, MD  oxyCODONE-acetaminophen (PERCOCET) 10-650 MG per tablet Take 1 tablet by mouth every 6 (six) hours as needed.      Historical Provider, MD  potassium chloride 20 MEQ/15ML (10%) solution Take 20 mEq by mouth daily.    Historical Provider, MD  promethazine (PHENERGAN) 25 MG tablet Take 25 mg by mouth every 8 (eight) hours as needed.      Historical Provider, MD  Propylene Glycol (SYSTANE BALANCE) 0.6 % SOLN Apply 1 drop to eye 2 (two) times daily.    Historical Provider, MD   Allergies  Allergen Reactions  . Codeine     REACTION: hallucinations  . Levofloxacin     REACTION: sores  . Morphine     REACTION: paralysis  . Pregabalin   . Sodium Benzoate     REACTION: unspecified  . Sulfonamide Derivatives     REACTION: anaphylactic    FAMILY HISTORY:  Family History  Problem Relation Age of Onset  . Pancreatic cancer Brother   . Lung cancer Sister   . Colon cancer Maternal Grandfather   . Diabetes Father    SOCIAL HISTORY:  reports that she quit smoking about 21 years ago. Her smoking use included Cigarettes. She has a 12 pack-year smoking history. She has never used smokeless tobacco. She reports that she does not drink alcohol. Her drug history is not on file.  REVIEW OF SYSTEMS:  Unattainable, patient is not communicable.  SUBJECTIVE: No complaints.  VITAL  SIGNS: VSS-AF.   HEMODYNAMICS: Noted   VENTILATOR SETTINGS: PRVC 10, Tv 500, PEEP 5 and FiO2 40%.   INTAKE / OUTPUT: Intake/Output   None     PHYSICAL EXAMINATION: General:  Chronically ill appearing, morbidly obese female. Neuro:  Arousable but not communicative. HEENT:  [place]/AT, PERRL, EOM-I and MMM. Cardiovascular:  RRR, Nl S1/S2, -M/R/G. Lungs:  Coarse BS diffusely. Abdomen:  Soft, NT, ND and +BS. Musculoskeletal:  1+ edema and -tenderness. Skin:  Echymosis but stable.  LABS:  CBC No results found for this basename: WBC, HGB, HCT, PLT,  in the last 168 hours Coag's No results found for this  basename: APTT, INR,  in the last 168 hours BMET No results found for this basename: NA, K, CL, CO2, BUN, CREATININE, GLUCOSE,  in the last 168 hours Electrolytes No results found for this basename: CALCIUM, MG, PHOS,  in the last 168 hours Sepsis Markers No results found for this basename: LATICACIDVEN, PROCALCITON, O2SATVEN,  in the last 168 hours ABG No results found for this basename: PHART, PCO2ART, PO2ART,  in the last 168 hours Liver Enzymes No results found for this basename: AST, ALT, ALKPHOS, BILITOT, ALBUMIN,  in the last 168 hours Cardiac Enzymes No results found for this basename: TROPONINI, PROBNP,  in the last 168 hours Glucose No results found for this basename: GLUCAP,  in the last 168 hours  Imaging Dg Swallowing Func-speech Pathology  08/15/2013   Riley Nearing Deblois, CCC-SLP     08/15/2013  4:11 PM Objective Swallowing Evaluation: Modified Barium Swallowing Study   Patient Details  Name: [name] MRN: [id number] Date of Birth: [birth year]  Today's Date: 08/15/2013 Time: 1430-1500 SLP Time Calculation (min): 30 min  Past Medical History:  Past Medical History  Diagnosis Date  . Fibromyalgia   . Osteoarthritis   . Asthma   . Type II or unspecified type diabetes mellitus without mention  of complication, not stated as uncontrolled   . Esophageal reflux   . Allergic  rhinitis, cause unspecified   . OSA on CPAP   . CAD (coronary artery disease)   . Other and unspecified hyperlipidemia    Past Surgical History:  Past Surgical History  Procedure Laterality Date  . Vesicovaginal fistula closure w/ tah    . Tonsillectomy    . Eye surgery    . Tracheostomy tube placement N/A 12/15/2012    Procedure: TRACHEOSTOMY;  Surgeon: Drema Halon, MD;   Location: [facility] OR;  Service: ENT;  Laterality: N/A;  . Tee without cardioversion N/A 01/20/2013    Procedure: TRANSESOPHAGEAL ECHOCARDIOGRAM (TEE);  Surgeon:  Vesta Mixer, MD;  Location: [facility] ENDOSCOPY;  Service:  Cardiovascular;  Laterality: N/A;   HPI:  31 yoF former smoker, followed for asthma/ bronchitis, allergic  rhinitis, Sleep apnea, OHS complicated by GERD, DM, polymyalgia  rheumatica and CAD. 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  Pt was  referred by Dr. Maple Hudson for OP MBS due to exacerbation of  trachel infection with green discharge, possibly related to food  being found in trach stoma or during suction. Pt has been made  NPO and feeding is restricted to PEG. Communication with pt is  extremely difficult given vent without PMSV and pt is unable to  write due to right shoulder pain. Pt arrived with paramedic  transport, accompanied by home health RN and husband. She is on a  portable vent set to SIMV.      Assessment / Plan / Recommendation Clinical Impression  Dysphagia Diagnosis: Moderate pharyngeal phase dysphagia Clinical impression: Very difficult exam; pt not appropriate for  MBS primairly due to body habitus. SLP unable to visualize pts  airway due to position of shoulders obscuring pts airway. Pt also  unable to phonate or cough/clear throat due to vent with cuffed  trach. Pt denies ever having attempted in-line PMSV. Proceeded  with attempt at exam to determine if any aspiraiton could be  identified given pts difficult transport here and desire to  eat/drink.   Pt did demosntrate a moderate and possibly severe  respiratory  based oropahryngeal dysphagia. Pt has incomplete airway closure  during the swallow despite adequate strength of musculature.  Timing of swallow also slightly delayed. With thin liquids via  straw (pts preferred method of intake) she was clearly seen to  have significant silent penetration before/during the swallow.  Depth of aspiration could not be seen. With all other trials and  consistencies penetration and aspiration was probably and could  not be ruled out. Pt appeared to have reduced penetration with  teaspoon sized boluses which were captured in the valleculae  prior to the swallow, allowing for better airway closure before  the bolus passed. Again, even with this strategy, aspiration  could not be ruled out.   Pt and Husband want to know if pt can eat or drink. Informed them  of high risk of aspiration with all POs but also educated them  that if they choose to try PO, limited teaspoon sips and purees  are best. Pt and husband will need to discuss findings with MD to  determine best course of action. Recommend considering a FEES  (fiberoptic endoscopic evaluation of swallowing) which is a much  better test given this pts body habitus. A FEES [facility] be  performed [facility] as an outpatient, but could possibly  be done by a traveling FEES clinician closer to pts home. Also  recommend pt be evaluated for an inline PMSV with a qualified  home health SLP (or during pts next trach clinic visit) to  improve ability to communicate and protect airway with PO.     Treatment Recommendation       Diet Recommendation NPO   Liquid Administration via:  (if consuming with known risk -  teaspoon thin/puree) Medication Administration: Via alternative means    Other  Recommendations Recommended Consults: FEES   Follow Up Recommendations  Home health SLP    Frequency and Duration        Pertinent Vitals/Pain NA    SLP Swallow Goals     General HPI: [age] yoF former smoker, followed for asthma/  bronchitis,  allergic rhinitis, Sleep apnea, OHS complicated by  GERD, DM, polymyalgia rheumatica and CAD. Pt has a trach and is  vent dependent, obsese. Pt was  referred by Dr. Maple HudsonYoung for OP MBS  due to exacerbation of trachel infection with green discharge,  possibly related to food being found in trach stoma or during  suction. Pt has been made NPO and feeding is restricted to PEG.  Communication with pt is extremely difficult given vent without  PMSV and pt is unable to write due to right shoulder pain. Pt  arrived with paramedic transport, accompanied by home health RN  and husband. She is on a portable vent set to SIMV.  Type of Study: Modified Barium Swallowing Study Reason for Referral: Objectively evaluate swallowing function Previous Swallow Assessment: none in records Diet Prior to this Study: NPO;PEG tube Temperature Spikes Noted: No Respiratory Status: Ventilator History of Recent Intubation: No Behavior/Cognition: Alert;Cooperative;Pleasant mood Oral Cavity - Dentition: Edentulous Oral Motor / Sensory Function: Within functional limits Self-Feeding Abilities: Needs assist Patient Positioning: Upright in chair Baseline Vocal Quality: Aphonic (unable to phonate due to  ventilator and cuff) Volitional Cough: Other (Comment) (unable due to vent) Volitional Swallow: Able to elicit Anatomy: Other (Comment) (very small oropharynx) Pharyngeal Secretions: Not observed secondary MBS    Reason for Referral Objectively evaluate swallowing function   Oral Phase Oral Preparation/Oral Phase Oral Phase: WFL (with liquids and puree, solids not tested pt  edentulous)   Pharyngeal Phase Pharyngeal  Phase Pharyngeal Phase: Impaired Pharyngeal - Nectar Pharyngeal - Nectar Teaspoon: Delayed swallow initiation;Reduced  epiglottic inversion;Reduced airway/laryngeal closure Pharyngeal - Nectar Straw: Delayed swallow initiation;Reduced  epiglottic inversion;Reduced airway/laryngeal closure Pharyngeal - Thin Pharyngeal - Thin Teaspoon: Delayed  swallow initiation;Reduced  epiglottic inversion;Reduced airway/laryngeal closure Pharyngeal - Thin Straw: Delayed swallow initiation;Reduced  epiglottic inversion;Reduced airway/laryngeal closure;Moderate  aspiration;Penetration/Aspiration before  swallow;Penetration/Aspiration during swallow Penetration/Aspiration details (thin straw):  (unable to  visualize due to body habitus)  Cervical Esophageal Phase    GO    Cervical Esophageal Phase Cervical Esophageal Phase:  (unable to visualize due to body  habitus)    Functional Assessment Tool Used: clinical judgement Functional Limitations: Swallowing Swallow Current Status ([id number]): At least 60 percent but less than  80 percent impaired, limited or restricted Swallow Goal Status [phone]([id number]): At least 60 percent but less than 80  percent impaired, limited or restricted Swallow Discharge Status [id number]([id number]): At least 60 percent but less  than 80 percent impaired, limited or restricted   [facility] HospitalBonnie DeBlois, MA CCC-SLP [phone]  Claudine MoutonDeBlois, Bonnie Caroline 08/15/2013, 4:09 PM      CXR: Pending  ASSESSMENT / PLAN:  PULMONARY A: Chronic respiratory failure, concern for aspiration, trach leaking air. P:   - Place on PRVC today. - Pan culture. - Hold off abx for now pending CXR. - Will likely need a change of trach to a larger one in AM.  CARDIOVASCULAR A: HTN P:  - Continue home meds.  RENAL A:  No active issues by history. P:   - BMET now and in AM. - Replace electrolytes as indicated.  GASTROINTESTINAL A:  ? Aspiration (food in trach). P:   - NPO. - Speech pathology. - Hold TF for ?procedure in AM.  HEMATOLOGIC A:  No active issues. P:  - CBC now.  INFECTIOUS A:  ?aspiration PNA, no fever, unaware what WBC cougn. P: - Pan culture. - Hold off abx for now. - Monitor, if fever or WBC or PCT is elevated may need to treat aspiration PNA.  ENDOCRINE A:  Hypothyroid and DM P:   - ISS - Synthroid 50 daily - Check TSh.  NEUROLOGIC A:  Arousable  but lethargic (evidently her baseline per home health nurse). P:   - Continue home clonazepam. - Continue home fentanyl patch. - Avoid sedation as able.  TODAY'S SUMMARY: Admit to the ICU, pan culture, hold of abx, all labs to be followed, will likely need a dilation and placement of a larger or longer tracheostomy in the ICU.  I have personally obtained a history, examined the patient, evaluated laboratory and imaging results, formulated the assessment and plan and placed orders.  CRITICAL CARE: The patient is critically ill with multiple organ systems failure and requires high complexity decision making for assessment and support, frequent evaluation and titration of therapies, application of advanced monitoring technologies and extensive interpretation of multiple databases. Critical Care Time devoted to patient care services described in this note is 45 minutes.   Alyson ReedyWesam G. Izzy Doubek, M.D. [facility] Medicine. Pager: [phone]. After hours pager: [phone].  08/15/2013, 5:01 PM

## 2013-08-16 ENCOUNTER — Encounter (HOSPITAL_COMMUNITY): Payer: Self-pay

## 2013-08-16 ENCOUNTER — Inpatient Hospital Stay (HOSPITAL_COMMUNITY): Payer: Medicare Other

## 2013-08-16 DIAGNOSIS — IMO0001 Reserved for inherently not codable concepts without codable children: Secondary | ICD-10-CM

## 2013-08-16 LAB — GLUCOSE, CAPILLARY
GLUCOSE-CAPILLARY: 144 mg/dL — AB (ref 70–99)
GLUCOSE-CAPILLARY: 180 mg/dL — AB (ref 70–99)
Glucose-Capillary: 179 mg/dL — ABNORMAL HIGH (ref 70–99)
Glucose-Capillary: 167 mg/dL — ABNORMAL HIGH (ref 70–99)
Glucose-Capillary: 179 mg/dL — ABNORMAL HIGH (ref 70–99)
Glucose-Capillary: 228 mg/dL — ABNORMAL HIGH (ref 70–99)

## 2013-08-16 LAB — MAGNESIUM: Magnesium: 2.1 mg/dL (ref 1.5–2.5)

## 2013-08-16 LAB — BASIC METABOLIC PANEL
Anion gap: 10 (ref 5–15)
BUN: 12 mg/dL (ref 6–23)
CALCIUM: 9 mg/dL (ref 8.4–10.5)
CO2: 33 mEq/L — ABNORMAL HIGH (ref 19–32)
Chloride: 92 mEq/L — ABNORMAL LOW (ref 96–112)
Creatinine, Ser: 0.3 mg/dL — ABNORMAL LOW (ref 0.50–1.10)
GFR calc Af Amer: >90 mL/min (ref >90)
Glucose, Bld: 174 mg/dL — ABNORMAL HIGH (ref 70–99)
Potassium: 3.9 mEq/L (ref 3.7–5.3)
Sodium: 135 mEq/L — ABNORMAL LOW (ref 137–147)

## 2013-08-16 LAB — CBC
HEMATOCRIT: 33.2 % — AB (ref 36.0–46.0)
Hemoglobin: 10.4 g/dL — ABNORMAL LOW (ref 12.0–15.0)
MCH: 30.1 pg (ref 26.0–34.0)
MCHC: 31.3 g/dL (ref 30.0–36.0)
MCV: 96.2 fL (ref 78.0–100.0)
Platelets: 345 10*3/uL (ref 150–400)
RBC: 3.45 MIL/uL — ABNORMAL LOW (ref 3.87–5.11)
RDW: 15 % (ref 11.5–15.5)
WBC: 10.4 10*3/uL (ref 4.0–10.5)

## 2013-08-16 LAB — PHOSPHORUS: Phosphorus: 3.3 mg/dL (ref 2.3–4.6)

## 2013-08-16 LAB — EXPECTORATED SPUTUM ASSESSMENT W REFEX TO RESP CULTURE

## 2013-08-16 LAB — EXPECTORATED SPUTUM ASSESSMENT W GRAM STAIN, RFLX TO RESP C

## 2013-08-16 LAB — PROCALCITONIN: PROCALCITONIN: 0.12 ng/mL

## 2013-08-16 MED ORDER — LEVOTHYROXINE SODIUM 50 MCG PO TABS: 50.0000 ug | ORAL_TABLET | Freq: Every day | ORAL | Status: DC

## 2013-08-16 MED ORDER — OXYCODONE HCL 5 MG PO TABS
5.0000 mg | ORAL_TABLET | Freq: Three times a day (TID) | ORAL | Status: DC
  Administered 2013-08-16 – 2013-08-29 (×40): 5 mg via ORAL
  Filled 2013-08-16 (×40): qty 1

## 2013-08-16 MED ORDER — MUPIROCIN 2 % EX OINT
1.0000 "application " | TOPICAL_OINTMENT | Freq: Two times a day (BID) | CUTANEOUS | Status: AC
  Administered 2013-08-16 – 2013-08-20 (×10): 1 via NASAL
  Filled 2013-08-16 (×3): qty 22

## 2013-08-16 MED ORDER — LEVOTHYROXINE SODIUM 50 MCG PO TABS
50.0000 ug | ORAL_TABLET | Freq: Every day | ORAL | Status: DC
  Administered 2013-08-16 – 2013-08-17 (×2): 50 ug via ORAL
  Filled 2013-08-16 (×2): qty 1

## 2013-08-16 MED ORDER — PRO-STAT SUGAR FREE PO LIQD
30.0000 mL | Freq: Two times a day (BID) | ORAL | Status: DC
Start: 2013-08-16 — End: 2013-08-17
  Administered 2013-08-16: 30 mL
  Filled 2013-08-16 (×3): qty 30

## 2013-08-16 MED ORDER — CHLORHEXIDINE GLUCONATE 0.12 % MT SOLN
15.0000 mL | Freq: Two times a day (BID) | OROMUCOSAL | Status: DC
  Administered 2013-08-17 – 2013-08-23 (×11): 15 mL via OROMUCOSAL
  Filled 2013-08-16 (×16): qty 15

## 2013-08-16 MED ORDER — SERTRALINE HCL 25 MG PO TABS
25.0000 mg | ORAL_TABLET | Freq: Every day | ORAL | Status: DC
  Administered 2013-08-16 – 2013-08-17 (×2): 25 mg via ORAL
  Filled 2013-08-16 (×3): qty 1

## 2013-08-16 MED ORDER — VITAL HIGH PROTEIN PO LIQD
1000.0000 mL | ORAL | Status: DC
  Administered 2013-08-16: 1000 mL
  Filled 2013-08-16 (×4): qty 1000

## 2013-08-16 MED ORDER — BIOTENE DRY MOUTH MT LIQD
15.0000 mL | Freq: Two times a day (BID) | OROMUCOSAL | Status: DC
  Administered 2013-08-17 – 2013-08-23 (×8): 15 mL via OROMUCOSAL

## 2013-08-16 MED ORDER — OXYCODONE HCL 5 MG PO TABS: 5.0000 mg | ORAL_TABLET | Freq: Three times a day (TID) | ORAL | Status: DC

## 2013-08-16 MED ORDER — CHLORHEXIDINE GLUCONATE CLOTH 2 % EX PADS
6.0000 | MEDICATED_PAD | Freq: Every day | CUTANEOUS | Status: AC
  Administered 2013-08-16 – 2013-08-20 (×5): 6 via TOPICAL

## 2013-08-16 NOTE — Progress Notes (Signed)
Speech Pathology:  Order received for MBS; [facility] the [facility]  [facility] with recs for NPO and further assessment, when warranted, by FEES.  (MBS procedurally difficult and did not allow for adequate visualization of anatomy.) If further assessment is necessary, please order FEES study.    Thanks, Shawnmichael Parenteau L. Samson Fredericouture, [place] CCC/SLP Pager [phone]

## 2013-08-16 NOTE — Progress Notes (Signed)
INITIAL NUTRITION ASSESSMENT  DOCUMENTATION CODES Per approved criteria  -Morbid Obesity   INTERVENTION: - Initiate TF via PEG of Vital High Protein start at 4020ml/hr increase by 10ml every 4 hours to goal of 6050ml/hr with Prostat 30ml BID which will provide 1400 calories, 135g protein, and 1003ml free water. This will meet 83% estimated calorie needs (24 kcal/kg ideal body weight) and 100% estimated protein needs. If IVF d/c, recommend 100ml water flushes 4 times/day.  - Multivitamin 5ml per PEG daily  - Initiate adult enteral protocol - RD to monitor plan of care   NUTRITION DIAGNOSIS: Inadequate oral intake related to inability to eat as evidenced by NPO.   Goal: Enteral nutrition to provide 60-70% of estimated calorie needs (22-25 kcals/kg ideal body weight) and 100% of estimated protein needs, based on ASPEN guidelines for permissive underfeeding in critically ill obese individuals   Monitor:  Weights, labs, diet advancement, TF tolerance, vent status  Reason for Assessment: Ventilated pt, consult for TF management   68 y.o. female  Admitting Dx: Tracheostomy leak and food matter in the tracheostomy  ASSESSMENT: Pt with tracheostomy placed in November 2014 after prolonged hospitalization for PNA and VDRF. Seen in the trach clinic 7/7 and was noted to have a significant leak from the trach site. The patient also has been aspirating (she evidently passed a swallow evaluation and has been eating). Pt on vent via trach. Has PEG which pt uses for 12 hours/day. Had modified barium swallow study yesterday with recommendations for NPO due to pt with moderate pharyngeal phase dysphagia.    - Pt reports she was eating regular foods, 3 meals/day PTA - States she was hardly eating anything at mealtimes - Not on any nutritional supplements PTA - States she's lost 100 pounds  - Husband and pt unsure of what kind of TF formula they were using at home, gave RD home health contact information -  staff reported pt was getting TF of Glucerna 1.2 at 8275ml/hr x 12 hours and was ordered to be NPO for the past week until trach change - Staff reported pt had been very distressed about being NPO as she enjoys being able to eat however staff didn't feel that PO diet was safe for pt as every time she would try to eat something they were suctioning at least part of the food out of her trach (gave example of eggs and grits) - Weight last month was documented by staff to be 236 pounds, currently 246 pounds   Patient is currently intubated on ventilator support via trach  MV: 6.5 L/min Temp (24hrs), Avg:98.3 F (36.8 C), Min:97.1 F (36.2 C), Max:98.9 F (37.2 C)  Propofol: off    Home TF regimen: Glucerna 1.2 at 875ml/hr x 12 hours - provided 1080 calories, 54g protein, and 724ml free water  Water flushes: 60ml every 6 hours with 5ml of water before medication administration and 30ml water after medication administration    Height: Ht Readings from Last 1 Encounters:  08/15/13 5\' 5"  (1.651 m)    Weight: Wt Readings from Last 1 Encounters:  08/16/13 246 lb 4.1 oz (111.7 kg)    Ideal Body Weight: 125 lbs   % Ideal Body Weight: 197%  Wt Readings from Last 10 Encounters:  08/16/13 246 lb 4.1 oz (111.7 kg)  07/31/13 236 lb (107.049 kg)  08/29/12 317 lb (143.79 kg)  08/14/10 279 lb 9.6 oz (126.826 kg)  02/28/10 273 lb 6.1 oz (124.005 kg)  09/19/09 276 lb 4  oz (125.306 kg)  12/11/08 286 lb (129.729 kg)  12/10/08 284 lb (128.822 kg)  08/20/08 296 lb (134.265 kg)  08/07/08 299 lb (135.626 kg)    Usual Body Weight: 236 lbs last month  % Usual Body Weight: 104%  BMI:  Body mass index is 40.98 kg/(m^2). Morbid obesity   Estimated Nutritional Needs: Kcal: 1677 Protein: 114-142g Fluid: per MD  Skin: +1 generalized edema, unstageable right heel pressure ulcer, stage 3 right and left buttocks pressure ulcer  Diet Order: NPO  EDUCATION NEEDS: -No education needs identified at  this time   Intake/Output Summary (Last 24 hours) at 08/16/13 1011 Last data filed at 08/16/13 0800  Gross per 24 hour  Intake   1190 ml  Output   1200 ml  Net    -10 ml    Last BM: PTA  Labs:   Recent Labs Lab 08/15/13 1820 08/16/13 0339  NA 134* 135*  K 3.8 3.9  CL 89* 92*  CO2 32 33*  BUN 15 12  CREATININE 0.31* 0.30*  CALCIUM 9.5 9.0  MG 2.2 2.1  PHOS 3.1 3.3  GLUCOSE 125* 174*    CBG (last 3)   Recent Labs  08/16/13 0028 08/16/13 0442 08/16/13 0745  GLUCAP 144* 179* 180*    Scheduled Meds: . chlorhexidine  15 mL Mouth Rinse BID  . Chlorhexidine Gluconate Cloth  6 each Topical Q0600  . dexamethasone  2 mg Per Tube Daily  . diltiazem  30 mg Oral 3 times per day  . fentaNYL  100 mcg Transdermal Q72H  . heparin subcutaneous  5,000 Units Subcutaneous 3 times per day  . insulin aspart  2-6 Units Subcutaneous 6 times per day  . ipratropium-albuterol  3 mL Nebulization Q4H  . levothyroxine  50 mcg Oral QAC breakfast  . metoprolol tartrate  50 mg Per Tube BID  . mupirocin ointment  1 application Nasal BID  . oxyCODONE  5 mg Oral TID  . pantoprazole (PROTONIX) IV  40 mg Intravenous Q24H    Continuous Infusions: . sodium chloride 100 mL/hr at 08/16/13 [id number]    Past Medical History  Diagnosis Date  . Fibromyalgia   . Osteoarthritis   . Asthma   . Type II or unspecified type diabetes mellitus without mention of complication, not stated as uncontrolled   . Esophageal reflux   . Allergic rhinitis, cause unspecified   . OSA on CPAP   . CAD (coronary artery disease)   . Other and unspecified hyperlipidemia     Past Surgical History  Procedure Laterality Date  . Vesicovaginal fistula closure w/ tah    . Tonsillectomy    . Eye surgery    . Tracheostomy tube placement N/A 12/15/2012    Procedure: TRACHEOSTOMY;  Surgeon: Drema Halonhristopher E Newman, MD;  [facility];  Service: ENT;  Laterality: N/A;  . Tee without cardioversion N/A 01/20/2013     Procedure: TRANSESOPHAGEAL ECHOCARDIOGRAM (TEE);  Surgeon: Vesta MixerPhilip J Nahser, MD;  [facility];  Service: Cardiovascular;  Laterality: N/A;    Charlott RakesHeather Santiago Stenzel MS, RD, LDN [phone] Pager [phone] Weekend/After Hours Pager

## 2013-08-16 NOTE — Progress Notes (Signed)
PULMONARY / CRITICAL CARE MEDICINE   Name: [name] MRN: [id number] DOB: 11/18/1945    ADMISSION DATE:  08/15/2013 CONSULTATION DATE:  08/15/2013  REFERRING MD :  [facility] PRIMARY SERVICE: PCCM  CHIEF COMPLAINT:  Tracheostomy air leak and food matter in the tracheostomy.  BRIEF PATIENT DESCRIPTION: 68 yo morbidly obese with tracheostomy placed in November 2014 after prolonged hospitalization for PNA and VDRF.  Seen in the trach clinic 7/7 and was noted to have a significant leak from the trach site. The patient also has been aspirating (she evidently passed a swallow evaluation and has been eating).  SIGNIFICANT EVENTS / STUDIES:   LINES / TUBES: Trach (chronic) PEG (chronic) Foley ??? >>>  CULTURES: Blood 7/7 >>> Urine 7/7 >>> Sputum 7/7 >>>  ANTIBIOTICS:  INTERVAL HISTORY: No acute events overnight.  VITAL SIGNS: Temp:  [97.1 F (36.2 C)-98.9 F (37.2 C)] 97.1 F (36.2 C) (07/08 0700) Pulse Rate:  [61-90] 61 (07/08 0800) Resp:  [13-26] 24 (07/08 0800) BP: (100-132)/(38-61) 117/50 mmHg (07/08 0800) SpO2:  [95 %-100 %] 96 % (07/08 0800) FiO2 (%):  [30 %-40 %] 30 % (07/08 0800) Weight:  [236 lb (107.049 kg)-246 lb 4.1 oz (111.7 kg)] 246 lb 4.1 oz (111.7 kg) (07/08 0500)  VENTILATOR SETTINGS: Vent Mode:  [-] PRVC FiO2 (%):  [30 %-40 %] 30 % Set Rate:  [10 bmp] 10 bmp Vt Set:  [450 mL] 450 mL PEEP:  [5 cmH20] 5 cmH20 Plateau Pressure:  [18 cmH20-23 cmH20] 20 cmH20  INTAKE / OUTPUT: Intake/Output     07/07 0701 - 07/08 0700 07/08 0701 - 07/09 0700   I.V. (mL/kg) 1090 (9.8) 100 (0.9)   Total Intake(mL/kg) 1090 (9.8) 100 (0.9)   Urine (mL/kg/hr) 1000 200 (0.9)   Total Output 1000 200   Net +90 -100          PHYSICAL EXAMINATION: General:  No distress, ventilated Neuro:  Awake, alert, attempting to verbalize, following commads HEENT:  Clean tracheostomy site, no air leak Cardiovascular:  Regular, no murmurs Lungs:  Bilateral distant air entry, no added  sounds Abdomen:  Obese, soft Musculoskeletal:  Trace edema Skin:  Intact  LABS:  CBC  Recent Labs Lab 08/15/13 1820 08/16/13 0339  WBC 10.9* 10.4  HGB 11.1* 10.4*  HCT 35.3* 33.2*  PLT 371 345   Coag's  Recent Labs Lab 08/15/13 1820  APTT 22*  INR 1.00   BMET  Recent Labs Lab 08/15/13 1820 08/16/13 0339  NA 134* 135*  K 3.8 3.9  CL 89* 92*  CO2 32 33*  BUN 15 12  CREATININE 0.31* 0.30*  GLUCOSE 125* 174*   Electrolytes  Recent Labs Lab 08/15/13 1820 08/16/13 0339  CALCIUM 9.5 9.0  MG 2.2 2.1  PHOS 3.1 3.3   Sepsis Markers  Recent Labs Lab 08/15/13 1819 08/16/13 0340  PROCALCITON 0.16 0.12   ABG No results found for this basename: PHART, PCO2ART, PO2ART,  in the last 168 hours  Liver Enzymes  Recent Labs Lab 08/15/13 1820  AST 26  ALT 35  ALKPHOS 89  BILITOT <0.2*  ALBUMIN 2.9*   Cardiac Enzymes No results found for this basename: TROPONINI, PROBNP,  in the last 168 hours  Glucose  Recent Labs Lab 08/15/13 1851 08/15/13 2005 08/16/13 0028 08/16/13 0442 08/16/13 0745  GLUCAP 130* 135* 144* 179* 180*   IMAGING:   Dg Chest Port 1 View  08/16/2013   CLINICAL DATA:  Check tracheostomy position  EXAM: PORTABLE  CHEST - 1 VIEW  COMPARISON:  08/15/2013  FINDINGS: A new tracheostomy tube is noted in satisfactory position. Persistent left basilar infiltrate is seen. Mild right basilar atelectasis is noted. No acute bony abnormality is seen. Chronic changes about the right shoulder are again seen.  IMPRESSION: Bibasilar changes left greater than right.  Tracheostomy as described.   Electronically Signed   By: Alcide Clever M.D.   On: 08/16/2013 07:05   Dg Chest Port 1v Same Day  08/15/2013   CLINICAL DATA:  Post procedure.  Tracheostomy exchange.  EXAM: PORTABLE CHEST - 1 VIEW SAME DAY  COMPARISON:  01/27/2013  FINDINGS: Study somewhat limited by rotation. The tracheostomy tube tracks over the upper thoracic trachea. No pneumothorax. There  is cardiomegaly. Patchy airspace disease throughout the left lung with blunting of left costophrenic angle, likely left fusion. Findings are stable. Right basilar atelectasis or scarring.  Old right femoral neck fracture again noted, stable.  IMPRESSION: Tracheostomy tip projects over the upper thoracic trachea.  Patchy left lung airspace disease and probable left effusion, stable.  Cardiomegaly.   Electronically Signed   By: Charlett Nose M.D.   On: 08/15/2013 18:19   Dg Swallowing Func-speech Pathology  08/15/2013   Riley Nearing Deblois, CCC-SLP     08/15/2013  4:11 PM Objective Swallowing Evaluation: Modified Barium Swallowing Study   Patient Details  Name: [name] MRN: [id number] Date of Birth: Aug 21, 1945  Today's Date: 08/15/2013 Time: 1430-1500 SLP Time Calculation (min): 30 min  Past Medical History:  Past Medical History  Diagnosis Date  . Fibromyalgia   . Osteoarthritis   . Asthma   . Type II or unspecified type diabetes mellitus without mention  of complication, not stated as uncontrolled   . Esophageal reflux   . Allergic rhinitis, cause unspecified   . OSA on CPAP   . CAD (coronary artery disease)   . Other and unspecified hyperlipidemia    Past Surgical History:  Past Surgical History  Procedure Laterality Date  . Vesicovaginal fistula closure w/ tah    . Tonsillectomy    . Eye surgery    . Tracheostomy tube placement N/A 12/15/2012    Procedure: TRACHEOSTOMY;  Surgeon: Drema Halon, MD;   [facility];  Service: ENT;  Laterality: N/A;  . Tee without cardioversion N/A 01/20/2013    Procedure: TRANSESOPHAGEAL ECHOCARDIOGRAM (TEE);  Surgeon:  Vesta Mixer, MD;  Location: [facility] ENDOSCOPY;  Service:  Cardiovascular;  Laterality: N/A;   HPI:  41 yoF former smoker, followed for asthma/ bronchitis, allergic  rhinitis, Sleep apnea, OHS complicated by GERD, DM, polymyalgia  rheumatica and CAD. 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  Pt was  referred by Dr. Maple Hudson for OP MBS due to  exacerbation of  trachel infection with green discharge, possibly related to food  being found in trach stoma or during suction. Pt has been made  NPO and feeding is restricted to PEG. Communication with pt is  extremely difficult given vent without PMSV and pt is unable to  write due to right shoulder pain. Pt arrived with paramedic  transport, accompanied by home health RN and husband. She is on a  portable vent set to SIMV.      Assessment / Plan / Recommendation Clinical Impression  Dysphagia Diagnosis: Moderate pharyngeal phase dysphagia Clinical impression: Very difficult exam; pt not appropriate for  MBS primairly due to body habitus. SLP unable to visualize pts  airway due to position of  shoulders obscuring pts airway. Pt also  unable to phonate or cough/clear throat due to vent with cuffed  trach. Pt denies ever having attempted in-line PMSV. Proceeded  with attempt at exam to determine if any aspiraiton could be  identified given pts difficult transport here and desire to  eat/drink.   Pt did demosntrate a moderate and possibly severe respiratory  based oropahryngeal dysphagia. Pt has incomplete airway closure  during the swallow despite adequate strength of musculature.  Timing of swallow also slightly delayed. With thin liquids via  straw (pts preferred method of intake) she was clearly seen to  have significant silent penetration before/during the swallow.  Depth of aspiration could not be seen. With all other trials and  consistencies penetration and aspiration was probably and could  not be ruled out. Pt appeared to have reduced penetration with  teaspoon sized boluses which were captured in the valleculae  prior to the swallow, allowing for better airway closure before  the bolus passed. Again, even with this strategy, aspiration  could not be ruled out.   Pt and Husband want to know if pt can eat or drink. Informed them  of high risk of aspiration with all POs but also educated them  that if they  choose to try PO, limited teaspoon sips and purees  are best. Pt and husband will need to discuss findings with MD to  determine best course of action. Recommend considering a FEES  (fiberoptic endoscopic evaluation of swallowing) which is a much  better test given this pts body habitus. A FEES could be  performed at [facility] hospital as an outpatient, but could possibly  be done by a traveling FEES clinician closer to pts home. Also  recommend pt be evaluated for an inline PMSV with a qualified  home health SLP (or during pts next trach clinic visit) to  improve ability to communicate and protect airway with PO.     Treatment Recommendation       Diet Recommendation NPO   Liquid Administration via:  (if consuming with known risk -  teaspoon thin/puree) Medication Administration: Via alternative means    Other  Recommendations Recommended Consults: FEES   Follow Up Recommendations  Home health SLP    Frequency and Duration        Pertinent Vitals/Pain NA    SLP Swallow Goals     General HPI: 68 yoF former smoker, followed for asthma/  bronchitis, allergic rhinitis, Sleep apnea, OHS complicated by  GERD, DM, polymyalgia rheumatica and CAD. Pt has a trach and is  vent dependent, obsese. Pt was  referred by Dr. Maple Hudson for OP MBS  due to exacerbation of trachel infection with green discharge,  possibly related to food being found in trach stoma or during  suction. Pt has been made NPO and feeding is restricted to PEG.  Communication with pt is extremely difficult given vent without  PMSV and pt is unable to write due to right shoulder pain. Pt  arrived with paramedic transport, accompanied by home health RN  and husband. She is on a portable vent set to SIMV.  Type of Study: Modified Barium Swallowing Study Reason for Referral: Objectively evaluate swallowing function Previous Swallow Assessment: none in records Diet Prior to this Study: NPO;PEG tube Temperature Spikes Noted: No Respiratory Status: Ventilator History of  Recent Intubation: No Behavior/Cognition: Alert;Cooperative;Pleasant mood Oral Cavity - Dentition: Edentulous Oral Motor / Sensory Function: Within functional limits Self-Feeding Abilities: Needs assist Patient Positioning: Upright in  chair Baseline Vocal Quality: Aphonic (unable to phonate due to  ventilator and cuff) Volitional Cough: Other (Comment) (unable due to vent) Volitional Swallow: Able to elicit Anatomy: Other (Comment) (very small oropharynx) Pharyngeal Secretions: Not observed secondary MBS    Reason for Referral Objectively evaluate swallowing function   Oral Phase Oral Preparation/Oral Phase Oral Phase: WFL (with liquids and puree, solids not tested pt  edentulous)   Pharyngeal Phase Pharyngeal Phase Pharyngeal Phase: Impaired Pharyngeal - Nectar Pharyngeal - Nectar Teaspoon: Delayed swallow initiation;Reduced  epiglottic inversion;Reduced airway/laryngeal closure Pharyngeal - Nectar Straw: Delayed swallow initiation;Reduced  epiglottic inversion;Reduced airway/laryngeal closure Pharyngeal - Thin Pharyngeal - Thin Teaspoon: Delayed swallow initiation;Reduced  epiglottic inversion;Reduced airway/laryngeal closure Pharyngeal - Thin Straw: Delayed swallow initiation;Reduced  epiglottic inversion;Reduced airway/laryngeal closure;Moderate  aspiration;Penetration/Aspiration before  swallow;Penetration/Aspiration during swallow Penetration/Aspiration details (thin straw):  (unable to  visualize due to body habitus)  Cervical Esophageal Phase    GO    Cervical Esophageal Phase Cervical Esophageal Phase:  (unable to visualize due to body  habitus)    Functional Assessment Tool Used: clinical judgement Functional Limitations: Swallowing Swallow Current Status ([id number]): At least 60 percent but less than  80 percent impaired, limited or restricted Swallow Goal Status [phone]): At least 60 percent but less than 80  percent impaired, limited or restricted Swallow Discharge Status [phone]([phone]): At least 60 percent but  less  than 80 percent impaired, limited or restricted   Harlon DittyBonnie DeBlois, MA CCC-SLP [phone]  Claudine MoutonDeBlois, Bonnie Caroline 08/15/2013, 4:09 PM    ASSESSMENT / PLAN:  PULMONARY A:  Chronic ventilator dependent respiratory failure Suspected air leak initially but none present 7/8 Persistent LLL airspace disease OSA P:   Goal pH>7.30, SpO2>92 Continuous mechanical support q hs, CPAP / PS during wake hours ABG/CXR PRN Albuterol / Atrovent No indications for up sizing May need XLT, will reevaluate before discharge  CARDIOVASCULAR A:  HTN HLD P:  Diltiazem, Metoprolol pre home regimen  RENAL A:   No active issues P:   Trend BMP  GASTROINTESTINAL A:   Suspected dysphagia, s/p PEG but was eating prior to admission Nutrition GI Px P:   [facility] ( [facility] - will transfer Start TF Protonix  HEMATOLOGIC A:    No active issues P:  Trend CBC   INFECTIOUS A:   No evidence of acute infection P: Defer abx  ENDOCRINE A:   Hypothyroidism DM Chronic steroids, indication is not clear, Polymyalgia rheumatica on record? P:   SSI Synthroid as preadmission Decadron as preadmission, does she still need it?  NEUROLOGIC A:   No active issues P:   Clonazepam, Fentanyl patch per preadmission regimen Oxycodone PRN  Canary BrimBrandi Ollis, NP-C [facility] Pgr: [phone] or [phone]  I have personally obtained history, examined patient, evaluated and interpreted laboratory and imaging results, reviewed medical records, formulated assessment / plan and placed orders.  CRITICAL CARE:  The patient is critically ill with multiple organ systems failure and requires high complexity decision making for assessment and support, frequent evaluation and titration of therapies, application of advanced monitoring technologies and extensive interpretation of multiple databases. Critical Care Time devoted to patient  care services described in this note is 35 minutes.   Lonia FarberZUBELEVITSKIY, Jermany Rimel, MD Pulmonary and Critical Care Medicine [facility] Pager: [phone]  08/16/2013, 10:39 AM

## 2013-08-17 ENCOUNTER — Inpatient Hospital Stay (HOSPITAL_COMMUNITY): Payer: Medicare Other

## 2013-08-17 DIAGNOSIS — E662 Morbid (severe) obesity with alveolar hypoventilation: Secondary | ICD-10-CM | POA: Diagnosis present

## 2013-08-17 LAB — CBC
HCT: 33.2 % — ABNORMAL LOW (ref 36.0–46.0)
HEMOGLOBIN: 10 g/dL — AB (ref 12.0–15.0)
MCH: 29.5 pg (ref 26.0–34.0)
MCHC: 30.1 g/dL (ref 30.0–36.0)
MCV: 97.9 fL (ref 78.0–100.0)
Platelets: 348 10*3/uL (ref 150–400)
RBC: 3.39 MIL/uL — AB (ref 3.87–5.11)
RDW: 15.4 % (ref 11.5–15.5)
WBC: 9.1 10*3/uL (ref 4.0–10.5)

## 2013-08-17 LAB — GLUCOSE, CAPILLARY
GLUCOSE-CAPILLARY: 220 mg/dL — AB (ref 70–99)
GLUCOSE-CAPILLARY: 190 mg/dL — AB (ref 70–99)
GLUCOSE-CAPILLARY: 170 mg/dL — AB (ref 70–99)
GLUCOSE-CAPILLARY: 193 mg/dL — AB (ref 70–99)
Glucose-Capillary: 160 mg/dL — ABNORMAL HIGH (ref 70–99)
Glucose-Capillary: 160 mg/dL — ABNORMAL HIGH (ref 70–99)
Glucose-Capillary: 175 mg/dL — ABNORMAL HIGH (ref 70–99)
Glucose-Capillary: 191 mg/dL — ABNORMAL HIGH (ref 70–99)
Glucose-Capillary: 197 mg/dL — ABNORMAL HIGH (ref 70–99)
Glucose-Capillary: 247 mg/dL — ABNORMAL HIGH (ref 70–99)
Glucose-Capillary: 284 mg/dL — ABNORMAL HIGH (ref 70–99)
Glucose-Capillary: 297 mg/dL — ABNORMAL HIGH (ref 70–99)
Glucose-Capillary: 321 mg/dL — ABNORMAL HIGH (ref 70–99)

## 2013-08-17 LAB — BASIC METABOLIC PANEL
Anion gap: 14 (ref 5–15)
BUN: 10 mg/dL (ref 6–23)
CHLORIDE: 94 meq/L — AB (ref 96–112)
CO2: 29 mEq/L (ref 19–32)
CREATININE: 0.3 mg/dL — AB (ref 0.50–1.10)
Calcium: 8.5 mg/dL (ref 8.4–10.5)
GFR calc Af Amer: >90 mL/min (ref >90)
GFR calc non Af Amer: >90 mL/min (ref >90)
GLUCOSE: 210 mg/dL — AB (ref 70–99)
POTASSIUM: 3.3 meq/L — AB (ref 3.7–5.3)
Sodium: 137 mEq/L (ref 137–147)

## 2013-08-17 LAB — URINE CULTURE: Colony Count: >=100000

## 2013-08-17 LAB — PROCALCITONIN: Procalcitonin: <0.1 ng/mL

## 2013-08-17 MED ORDER — PRO-STAT SUGAR FREE PO LIQD
30.0000 mL | Freq: Three times a day (TID) | ORAL | Status: DC
  Administered 2013-08-17 – 2013-08-28 (×34): 30 mL
  Administered 2013-08-28: 21:00:00
  Administered 2013-08-29: 30 mL
  Filled 2013-08-17 (×38): qty 30

## 2013-08-17 MED ORDER — DEXAMETHASONE 1 MG/ML PO CONC
1.0000 mg | Freq: Every day | ORAL | Status: DC
  Administered 2013-08-18 – 2013-08-26 (×9): 1 mg
  Filled 2013-08-17 (×9): qty 1

## 2013-08-17 MED ORDER — INSULIN GLARGINE 100 UNIT/ML ~~LOC~~ SOLN
20.0000 [IU] | Freq: Two times a day (BID) | SUBCUTANEOUS | Status: DC
  Administered 2013-08-17 – 2013-08-21 (×10): 20 [IU] via SUBCUTANEOUS
  Filled 2013-08-17 (×13): qty 0.2

## 2013-08-17 MED ORDER — LATANOPROST 0.005 % OP SOLN
1.0000 [drp] | Freq: Every day | OPHTHALMIC | Status: DC
  Administered 2013-08-17 – 2013-08-28 (×12): 1 [drp] via OPHTHALMIC
  Filled 2013-08-17: qty 2.5

## 2013-08-17 MED ORDER — INSULIN ASPART 100 UNIT/ML ~~LOC~~ SOLN
0.0000 [IU] | SUBCUTANEOUS | Status: DC
  Administered 2013-08-17: 15 [IU] via SUBCUTANEOUS
  Administered 2013-08-17: 4 [IU] via SUBCUTANEOUS
  Administered 2013-08-17: 11 [IU] via SUBCUTANEOUS
  Administered 2013-08-18: 4 [IU] via SUBCUTANEOUS
  Administered 2013-08-18: 7 [IU] via SUBCUTANEOUS
  Administered 2013-08-18: 4 [IU] via SUBCUTANEOUS
  Administered 2013-08-18: 11 [IU] via SUBCUTANEOUS
  Administered 2013-08-18: 4 [IU] via SUBCUTANEOUS
  Administered 2013-08-18: 11 [IU] via SUBCUTANEOUS
  Administered 2013-08-19 (×4): 7 [IU] via SUBCUTANEOUS
  Administered 2013-08-19: 3 [IU] via SUBCUTANEOUS
  Administered 2013-08-19 – 2013-08-20 (×2): 4 [IU] via SUBCUTANEOUS
  Administered 2013-08-20: 7 [IU] via SUBCUTANEOUS
  Administered 2013-08-20: 3 [IU] via SUBCUTANEOUS
  Administered 2013-08-20: 7 [IU] via SUBCUTANEOUS
  Administered 2013-08-20 – 2013-08-21 (×2): 4 [IU] via SUBCUTANEOUS
  Administered 2013-08-21: 7 [IU] via SUBCUTANEOUS
  Administered 2013-08-21: 4 [IU] via SUBCUTANEOUS
  Administered 2013-08-21: 7 [IU] via SUBCUTANEOUS
  Administered 2013-08-21: 05:00:00 via SUBCUTANEOUS
  Administered 2013-08-21: 3 [IU] via SUBCUTANEOUS
  Administered 2013-08-22: 7 [IU] via SUBCUTANEOUS
  Administered 2013-08-22: 11 [IU] via SUBCUTANEOUS
  Administered 2013-08-22 (×2): 7 [IU] via SUBCUTANEOUS
  Administered 2013-08-22 (×2): 4 [IU] via SUBCUTANEOUS
  Administered 2013-08-23: 7 [IU] via SUBCUTANEOUS
  Administered 2013-08-23: 4 [IU] via SUBCUTANEOUS
  Administered 2013-08-23 (×3): 7 [IU] via SUBCUTANEOUS
  Administered 2013-08-23: 4 [IU] via SUBCUTANEOUS
  Administered 2013-08-24: 7 [IU] via SUBCUTANEOUS
  Administered 2013-08-24: 11 [IU] via SUBCUTANEOUS
  Administered 2013-08-24: 4 [IU] via SUBCUTANEOUS
  Administered 2013-08-24: 7 [IU] via SUBCUTANEOUS
  Administered 2013-08-24 (×2): 4 [IU] via SUBCUTANEOUS
  Administered 2013-08-25: 11 [IU] via SUBCUTANEOUS
  Administered 2013-08-25 (×3): 4 [IU] via SUBCUTANEOUS
  Administered 2013-08-25: 7 [IU] via SUBCUTANEOUS
  Administered 2013-08-25 – 2013-08-26 (×3): 4 [IU] via SUBCUTANEOUS
  Administered 2013-08-26 (×2): 7 [IU] via SUBCUTANEOUS
  Administered 2013-08-26 (×2): 4 [IU] via SUBCUTANEOUS
  Administered 2013-08-27: 7 [IU] via SUBCUTANEOUS
  Administered 2013-08-27: 3 [IU] via SUBCUTANEOUS
  Administered 2013-08-27 (×2): 4 [IU] via SUBCUTANEOUS
  Administered 2013-08-27 (×2): 3 [IU] via SUBCUTANEOUS
  Administered 2013-08-28: 11 [IU] via SUBCUTANEOUS
  Administered 2013-08-28: 4 [IU] via SUBCUTANEOUS
  Administered 2013-08-28: 3 [IU] via SUBCUTANEOUS
  Administered 2013-08-28: 4 [IU] via SUBCUTANEOUS
  Administered 2013-08-28: 7 [IU] via SUBCUTANEOUS
  Administered 2013-08-28: 11 [IU] via SUBCUTANEOUS
  Administered 2013-08-29 (×2): 7 [IU] via SUBCUTANEOUS
  Administered 2013-08-29: 4 [IU] via SUBCUTANEOUS
  Administered 2013-08-29 (×2): 7 [IU] via SUBCUTANEOUS

## 2013-08-17 MED ORDER — POTASSIUM CHLORIDE 20 MEQ/15ML (10%) PO LIQD
40.0000 meq | Freq: Once | ORAL | Status: AC
  Administered 2013-08-17: 40 meq
  Filled 2013-08-17: qty 30

## 2013-08-17 MED ORDER — DORZOLAMIDE HCL 2 % OP SOLN
1.0000 [drp] | Freq: Two times a day (BID) | OPHTHALMIC | Status: DC
  Administered 2013-08-17 – 2013-08-29 (×24): 1 [drp] via OPHTHALMIC
  Filled 2013-08-17: qty 10

## 2013-08-17 MED ORDER — PANTOPRAZOLE SODIUM 40 MG PO PACK
40.0000 mg | PACK | Freq: Every day | ORAL | Status: DC
  Administered 2013-08-17: 40 mg
  Filled 2013-08-17: qty 20

## 2013-08-17 MED ORDER — LEVOTHYROXINE SODIUM 50 MCG PO TABS
50.0000 ug | ORAL_TABLET | Freq: Every day | ORAL | Status: DC
  Administered 2013-08-18 – 2013-08-29 (×12): 50 ug
  Filled 2013-08-17 (×14): qty 1

## 2013-08-17 MED ORDER — DEXTROSE 5 % IV SOLN
1.0000 g | INTRAVENOUS | Status: AC
  Administered 2013-08-17 – 2013-08-19 (×3): 1 g via INTRAVENOUS
  Filled 2013-08-17 (×3): qty 10

## 2013-08-17 MED ORDER — SERTRALINE HCL 25 MG PO TABS
25.0000 mg | ORAL_TABLET | Freq: Every day | ORAL | Status: DC
  Administered 2013-08-18 – 2013-08-29 (×12): 25 mg
  Filled 2013-08-17 (×12): qty 1

## 2013-08-17 MED ORDER — DILTIAZEM 12 MG/ML ORAL SUSPENSION
30.0000 mg | Freq: Three times a day (TID) | ORAL | Status: DC
  Administered 2013-08-17 – 2013-08-29 (×37): 30 mg
  Filled 2013-08-17 (×39): qty 3

## 2013-08-17 MED ORDER — VITAL HIGH PROTEIN PO LIQD
1000.0000 mL | ORAL | Status: DC
  Administered 2013-08-17 – 2013-08-28 (×9): 1000 mL
  Filled 2013-08-17 (×14): qty 1000

## 2013-08-17 MED ORDER — SODIUM CHLORIDE 0.9 % IV SOLN
INTRAVENOUS | Status: DC
  Administered 2013-08-17: 1.3 [IU]/h via INTRAVENOUS
  Filled 2013-08-17: qty 1

## 2013-08-17 MED ORDER — FAMOTIDINE 40 MG/5ML PO SUSR
20.0000 mg | Freq: Every day | ORAL | Status: DC
  Administered 2013-08-17 – 2013-08-28 (×12): 20 mg
  Filled 2013-08-17 (×13): qty 2.5

## 2013-08-17 NOTE — Procedures (Signed)
Objective Swallowing Evaluation: Fiberoptic Endoscopic Evaluation of Swallowing  Patient Details  Name: [name] MRN: [id number] Date of Birth: 09/18/1945  Today's Date: 08/17/2013 Time: 1530-1610 SLP Time Calculation (min): 40 min  Past Medical History:  Past Medical History  Diagnosis Date  . Fibromyalgia   . Osteoarthritis   . Asthma   . Type II or unspecified type diabetes mellitus without mention of complication, not stated as uncontrolled   . Esophageal reflux   . Allergic rhinitis, cause unspecified   . OSA on CPAP   . CAD (coronary artery disease)   . Other and unspecified hyperlipidemia    Past Surgical History:  Past Surgical History  Procedure Laterality Date  . Vesicovaginal fistula closure w/ tah    . Tonsillectomy    . Eye surgery    . Tracheostomy tube placement N/A 12/15/2012    Procedure: TRACHEOSTOMY;  Surgeon: Drema Halonhristopher E Newman, MD;  [facility];  Service: ENT;  Laterality: N/A;  . Tee without cardioversion N/A 01/20/2013    Procedure: TRANSESOPHAGEAL ECHOCARDIOGRAM (TEE);  Surgeon: Vesta MixerPhilip J Nahser, MD;  [facility];  Service: Cardiovascular;  Laterality: N/A;   HPI:  89 yoF former smoker, followed for asthma/ bronchitis, allergic rhinitis, Sleep apnea, OHS complicated by GERD, DM, polymyalgia rheumatica and CAD. Tracheostomy placed in NOvember 2014 after prolonged hospitalization for PNA and VDRF. Pt was  referred by Dr. Maple HudsonYoung for OP MBS due to exacerbation of trachel infection with green discharge, possibly related to food being found in trach stoma or during suction. Pt has been made NPO and feeding is restricted to PEG. MBS complete 7/7 with limited results due to patient body habitus. Subsequently seen in the trach clinic 7/7 and was noted to have a significant leak from the trach site. [facility] vent, and FEES to evaluate swallow. In-line PMSV evaluation also pending completion 7/10.       Assessment / Plan / Recommendation Clinical Impression  Dysphagia Diagnosis: Severe pharyngeal phase dysphagia Clinical impression: Patient presents with a severe pharyngeal phase dysphagia, likely multifactorial in nature due to generalized laryngeal/pharyngeal weakness and full vent support needs resulting in  pressure inbalance during the swallow poor coordination of breath with swallow. Mild-moderate pharyngeal residuals consistently present across consistencies tested which are penetrated and eventually aspirated without patient awareness or response. Attempts at faciliating multiple swallow, chin tuck, and coordination of vent breath with swallow to create a more normal swallowing pattern all unsuccessful at decreasing episodes of increasing clearance of pharyx significantly enough for improved safety of swallow. Cues for hard cough unsuccessful as well as patient with an inflated cuff (although with some leak speech despite). 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 Cannot r/o neuro component to swallowing disfunction. Aspiration risk high with all pos. Education complete including use of video for demonstration regarding results, risks, and recommendations. Recommend patient remain NPO. Will proceed with in-line PMSV 7/10 with RT present to facilitate verbal communication. Prognosis for ability to utilize PMSV good given ability to volitionally produce leak speech with inflated cuff. Depending on patients abilities to produce a strong cough response with PMSV present and/or better demonstrate coordination of vent breath with swallow, would consider repeating FEES with PMSV in place. Will f/u closely.     Treatment Recommendation  Therapy as outlined in treatment plan below    Diet Recommendation NPO  Medication Administration: Via alternative means    Other  Recommendations Oral Care Recommendations:  (QID)   Follow Up  Recommendations  Home health SLP    Frequency and Duration min 2x/week  1 week        General HPI: 53 yoF former smoker, followed for asthma/ bronchitis, allergic rhinitis, Sleep apnea, OHS complicated by GERD, DM, polymyalgia rheumatica and CAD. Tracheostomy placed in NOvember 2014 after prolonged hospitalization for PNA and VDRF. Pt was  referred by Dr. Maple Hudson for OP MBS due to exacerbation of trachel infection with green discharge, possibly related to food being found in trach stoma or during suction. Pt has been made NPO and feeding is restricted to PEG. MBS complete 7/7 with limited results due to patient body habitus. Subsequently seen in the trach clinic 7/7 and was noted to have a significant leak from the trach site. Admitted to [facility], then transferred to [facility] for management of trach, vent, and FEES to evaluate swallow. In-line PMSV evaluation also pending completion 7/10.  Type of Study: Fiberoptic Endoscopic Evaluation of Swallowing Reason for Referral: Objectively evaluate swallowing function Previous Swallow Assessment: none in records Diet Prior to this Study: NPO;PEG tube Temperature Spikes Noted: No Respiratory Status: Ventilator (full vent support) History of Recent Intubation: No Behavior/Cognition: Alert;Cooperative;Pleasant mood Oral Cavity - Dentition: Edentulous Oral Motor / Sensory Function: Within functional limits Self-Feeding Abilities: Needs assist Patient Positioning: Upright in bed Baseline Vocal Quality:  (minimal leak speech noted around inflated cuff, vent support) Volitional Cough: Weak (very limited due to vent) Volitional Swallow: Able to elicit Anatomy:  (small oropharnx, ? edema) Pharyngeal Secretions: Normal    Reason for Referral Objectively evaluate swallowing function   Oral Phase Oral Preparation/Oral Phase Oral Phase: WFL   Pharyngeal Phase Pharyngeal Phase Pharyngeal Phase: Impaired Pharyngeal - Nectar Pharyngeal - Nectar Teaspoon: Delayed  swallow initiation;Premature spillage to pyriform sinuses;Reduced pharyngeal peristalsis;Reduced anterior laryngeal mobility;Reduced laryngeal elevation;Reduced airway/laryngeal closure;Penetration/Aspiration during swallow;Penetration/Aspiration after swallow;Trace aspiration;Pharyngeal residue - valleculae;Pharyngeal residue - pyriform sinuses;Compensatory strategies attempted (Comment) (chin tuck ineffective, primarily left sided pooling noted) Penetration/Aspiration details (nectar teaspoon): Material enters airway, passes BELOW cords without attempt by patient to eject out (silent aspiration) Pharyngeal - Nectar Straw: Not tested Pharyngeal - Thin Pharyngeal - Ice Chips: Delayed swallow initiation;Premature spillage to pyriform sinuses;Reduced pharyngeal peristalsis;Reduced anterior laryngeal mobility;Reduced laryngeal elevation;Reduced airway/laryngeal closure;Penetration/Aspiration during swallow;Penetration/Aspiration after swallow;Pharyngeal residue - valleculae;Pharyngeal residue - pyriform sinuses;Compensatory strategies attempted (Comment) Penetration/Aspiration details (ice chips): Material enters airway, CONTACTS cords and not ejected out Pharyngeal - Thin Teaspoon: Not tested Pharyngeal - Thin Straw: Not tested Pharyngeal - Solids Pharyngeal - Puree: Delayed swallow initiation;Reduced pharyngeal peristalsis;Reduced anterior laryngeal mobility;Reduced laryngeal elevation;Reduced airway/laryngeal closure;Penetration/Aspiration during swallow;Penetration/Aspiration after swallow;Trace aspiration;Pharyngeal residue - valleculae;Pharyngeal residue - pyriform sinuses;Compensatory strategies attempted (Comment);Premature spillage to valleculae (primarily left sided pooling noted) Penetration/Aspiration details (puree): Material enters airway, passes BELOW cords without attempt by patient to eject out (silent aspiration)  Cervical Esophageal Phase    GO  Ferdinand Lango MA,  CCC-SLP [phone]   Cervical Esophageal Phase Cervical Esophageal Phase:  (n/a)         [name] Meryl 08/17/2013, 4:34 PM

## 2013-08-17 NOTE — Progress Notes (Signed)
Attempted to contact spouse to review results of FEES. No response. Will f/u 7/10 for education.  Ferdinand LangoLeah Cresta Riden MA, CCC-SLP [phone]

## 2013-08-17 NOTE — Trach Care Team (Signed)
Trach Care Progression Note   Patient Details Name: [name] MRN: [id number] DOB: 11/12/1945 Today's Date: 08/17/2013   Tracheostomy Assessment    Tracheostomy Shiley 6 mm Cuffed;Proximal (Active)     Tracheostomy Portex 6 mm Cuffed (Active)  Status Secured 08/17/2013 11:37 AM  Site Assessment Clean;Dry 08/17/2013 11:37 AM  Site Care Cleansed 08/16/2013  8:00 PM  Inner Cannula Care Cleansed/dried 08/16/2013  8:00 PM  Ties Assessment Clean;Dry;Secure 08/17/2013 11:37 AM  Cuff pressure (cm) 24 cm 08/17/2013  7:59 AM  Emergency Equipment at bedside Yes 08/17/2013 11:37 AM     Care Needs     Respiratory Therapy O2 Device: Ventilator FiO2 (%): 30 % SpO2: 99 %    Speech Language Pathology      Physical Therapy      Occupational Therapy      Nutritional Patient's Current Diet: Tube feeding Tube Feeding: Vital High Protein Tube Feeding Frequency: Continuous Tube Feeding Strength: Full strength    Case Management/Social Work  Home vent x5 months followed by home health care([facility] and [facility])  Plan to return home.    Provider  [facility] Team Recommendations  [facility] Team Members -  [place]nie DeBlois, SLP, Nicolasa DuckingSally Holland, CM, [place], SW, [place]in Ferguson, [place] and [place]ather Pitts, [place]  Dirk DressKaty Whiteheart, NP  SLP evaluation pending.         Markian Glockner, Silva BandyDebra Anita (scribe for team) 08/17/2013, 2:51 PM

## 2013-08-17 NOTE — Progress Notes (Signed)
UR Completed.  [name] [phone] 08/17/2013  

## 2013-08-17 NOTE — Progress Notes (Signed)
PULMONARY / CRITICAL CARE MEDICINE   Name: Shelly KohlerRuth M Kelley MRN: [id number] DOB: 01/02/1946    ADMISSION DATE:  08/15/2013 CONSULTATION DATE:  08/15/2013  REFERRING MD :  [facility] PRIMARY SERVICE: PCCM  CHIEF COMPLAINT:  Tracheostomy air leak and food matter in the tracheostomy.  BRIEF PATIENT DESCRIPTION: 68 yo morbidly obese with tracheostomy placed in November 2014 after prolonged hospitalization for PNA and VDRF.  Seen in the trach clinic 7/7 and was noted to have a significant leak from the trach site. The patient also has been aspirating (she evidently passed a swallow evaluation and has been eating).  SIGNIFICANT EVENTS / STUDIES:   LINES / TUBES: Trach (chronic) PEG (chronic)  CULTURES: Urine 7/7 >> E coli (pansens) Blood 7/7 >>    ANTIBIOTICS: Ceftriaxone 7/09 >>   INTERVAL HISTORY: No acute events overnight.  VITAL SIGNS: Temp:  [97.7 F (36.5 C)-98.9 F (37.2 C)] 98 F (36.7 C) (07/09 1158) Pulse Rate:  [25-113] 95 (07/09 1200) Resp:  [13-35] 19 (07/09 1200) BP: (83-128)/(37-70) 116/51 mmHg (07/09 1200) SpO2:  [96 %-100 %] 99 % (07/09 1200) FiO2 (%):  [30 %] 30 % (07/09 1145) Weight:  [109.7 kg (241 lb 13.5 oz)-111.4 kg (245 lb 9.5 oz)] 111.4 kg (245 lb 9.5 oz) (07/09 0500)  VENTILATOR SETTINGS: Vent Mode:  [-] CPAP;PSV FiO2 (%):  [30 %] 30 % Set Rate:  [10 bmp] 10 bmp Vt Set:  [450 mL] 450 mL PEEP:  [5 cmH20] 5 cmH20 Pressure Support:  [10 cmH20] 10 cmH20 Plateau Pressure:  [18 cmH20-23 cmH20] 18 cmH20  INTAKE / OUTPUT: Intake/Output     07/08 0701 - 07/09 0700 07/09 0701 - 07/10 0700   I.V. (mL/kg) 102.9 (0.9) 2.3 (0)   Other  10   NG/GT 290 250   Total Intake(mL/kg) 392.9 (3.5) 262.3 (2.4)   Urine (mL/kg/hr) 1215 (0.5) 115 (0.1)   Total Output 1215 115   Net -822.2 +147.3          PHYSICAL EXAMINATION: General:  Obese, NAD, RASS 0, + F/C Neuro:  Diffusely weak, no focal deficits HEENT: WNL, trach site clean Cardiovascular:  RRR s  M Lungs: clear anteriorly Abdomen:  Obese, soft, +BS, G tube site clean Ext: minimal symmetric edema  LABS: I have reviewed all of today's lab results. Relevant abnormalities are discussed in the A/P section   CXR: Patient is rotated leftward. Stable enlarged cardiac silhouette. There is left lower lobe opacity. Right lung is clear.  ASSESSMENT / PLAN:  PULMONARY A:  Chronic ventilator dependent respiratory failure Chronic trach status - trach tube appears to be well positioned Persistent LLL airspace disease OHS/OSA P:   Cont vent support - settings reviewed and/or adjusted Wean to PSV and/or ATC as tolerated Cont vent bundle Daily SBT if/when meets criteria Goal of daytime ATC and mandatory nocturnal vent SLP eval for PMV  CARDIOVASCULAR A:  HTN HLD P:  Cont Diltiazem, Metoprolol pre home regimen  RENAL A:   Hypokalemia P:   Monitor BMET intermittently Monitor I/Os Correct electrolytes as indicated  GASTROINTESTINAL A:   Suspected dysphagia Chronic G tube Morbid obesity P:   SUP: enteral famotidine Cont TFs - calorie restricted SLP swallow eval  HEMATOLOGIC A:    Mild chronic anemia P:  DVT px: SQ heparin Monitor CBC intermittently Transfuse per usual ICU guidelines   INFECTIOUS A:   Asymptomatic pyuria and bacteriuria  P: Micro and abx as above Abx X 3 days planned  ENDOCRINE A:  Hypothyroidism DM II Chronic steroids, indication is not clear, Polymyalgia rheumatica on record P:   Cont SSI Cont l-thyroxine Taper dexamethasone as tolerated  NEUROLOGIC A:   Profound deconditioning P:   Clonazepam, Fentanyl patch per preadmission regimen Oxycodone PRN  Transfer to vent SDU bed  I have personally obtained history, examined patient, evaluated and interpreted laboratory and imaging results, reviewed medical records, formulated assessment / plan and placed orders.  CRITICAL CARE:  The patient is critically ill with multiple organ  systems failure and requires high complexity decision making for assessment and support, frequent evaluation and titration of therapies, application of advanced monitoring technologies and extensive interpretation of multiple databases. Critical Care Time devoted to patient care services described in this note is 40 minutes.   Billy Fischer, MD Pulmonary and Critical Care Medicine [facility] Pager: [phone]  08/17/2013, 2:40 PM

## 2013-08-17 NOTE — Care Management Note (Addendum)
Page 1 of 2   08/29/2013     2:36:36 PM CARE MANAGEMENT NOTE 08/29/2013  Patient:  [name], [name]   Account Number:  [id number]  Date Initiated:  08/16/2013  Documentation initiated by:  [facility]  Subjective/Objective Assessment:   Chronic trach with leak     Action/Plan:   Anticipated DC Date:  08/29/2013   Anticipated DC Plan:  HOME W HOME HEALTH SERVICES      DC Planning Services  CM consult      [facility]  Resumption Of Svcs/PTA Provider   Choice offered to / List presented to:             Status of service:  Completed, signed off Medicare Important Message given?  YES (If response is "NO", the following Medicare IM given date fields will be blank) Date Medicare IM given:  08/18/2013 Medicare IM given by:  Junius Creamer Date Additional Medicare IM given:  08/28/2013 Additional Medicare IM given by:  [facility]  Discharge Disposition:    Per UR Regulation:  Reviewed for med. necessity/level of care/duration of stay  If discussed at Long Length of Stay Meetings, dates discussed:   08/22/2013    Comments:  Contact:  Max Sane Spouse [phone] PCP - Clayborn Bigness [phone]  08-29-2013  2:30pm Avie Arenas, RNBSN [phone] Discharging home today.  [facility] transporting with vent if needed.  At this time is on trach collar.  Husband on board with plan as is [name] with [facility] agency who will have a nurse at home on arrival.  Husband also informed to let Physician know that she is back home for f/u appt. at home.  08-28-13 11:35am Avie Arenas, RNBSN - [phone] Possible dc home today.  Notified [name] with [facility] agency. Will call me back to see if nurses available today and when. Patient needing swallowing testing prior to discharge home with new trach.  Plan for dc tomorrow.  Updated HH agency of plan for dc tomorrow.  Will need to give them 1 hour notice of dc to make sure nurse at house when she arrives.  08-23-13 1:45pm Avie Arenas, RNBSN [phone] Tx back to ICU - post resp arrest.  ?? mucuous plug while on trach collar and speech working with on swallowing. Brief CPR.  Now awake and alert.  08/23/13 1031 Shelly Mayo RN MSN BSN CCM Pt on TC trials, passed FEES, dysphagia diet started.  08-17-13 1:45pm Avie Arenas, RNBSN [phone] Husband in room.  Frustrated.  patient has been in hospital systems - for 9 months and just got home 5 months ago. Plan was to come to hospital for trach change and swallowing studies for possible aspiration.  Now not sure if this is going to happen.  Plan is to try to wean from vent. Since being home patient has an Charity fundraiser in house for 16 hours a day M-F and 12 hours on sat and sun.  husband is with patient the rest of the time.  RN's come from [employer]. Contact - Adele Barthel - [phone] main available 24/7  or cell [phone].  Equipment - vent, etc.  comes from [facility]. Husband and daughter are trained on vent.  Daughter lives close.  Have a PCP listed above and physician comes to house to see her.  Husband is happy with set up as is and would like to get her back home asap.  Concerned about wounds on bottom.  patient  has not been OOB since she has been home and on the vent 24/7.   Contactced Toniann FailWendy just to give an update, confirmed will be picking back up on discharge and we will be coordinating discharge with her. States patient always hungry.  Knows she is getting a tube feeding but, still wants oral food. CM will continue to follow.

## 2013-08-17 NOTE — Progress Notes (Signed)
eLink Physician-Brief Progress Note Patient Name: Shelly KohlerRuth M Kelley DOB: 12/24/1945 MRN: [id number]  Date of Service  08/17/2013   HPI/Events of Note     eICU Interventions  Hypokalemia -repleted    Intervention Category Intermediate Interventions: Electrolyte abnormality - evaluation and management  [name] V. 08/17/2013, 4:22 AM

## 2013-08-17 NOTE — Progress Notes (Addendum)
NUTRITION FOLLOW UP  Intervention:    Continue 55M PEPuP Protocol with TF via PEG: Vital High Protein at 30 ml/h (720 ml/day) and Prostat 30 ml TID to provide 1020 kcals (55% of estimated nutrition needs, 19.5 kcals/kg ideal weight), 108 gm protein, 602 ml free water daily.  Nutrition Dx:   Inadequate oral intake related to inability to eat as evidenced by NPO status, ongoing.  Goal:   Enteral nutrition to meet protein needs with calorie provision to promote weight loss.  Monitor:   TF tolerance/adequacy, weight trend, labs, vent status.  Assessment:   Pt with tracheostomy placed in November 2014 after prolonged hospitalization for PNA and VDRF. Seen in the trach clinic 7/7 and was noted to have a significant leak from the trach site. The patient also has been aspirating (she evidently passed a swallow evaluation and has been eating). Pt on vent via trach. Has PEG which pt uses for 12 hours/day.   PTA patient was eating 3 meals per day, regular foods, but minimal intake. Received TF via PEG Glucerna 1.2 at 6175ml/hr x 12 hours providing 1080 kcals, 54 gm protein, 725 ml free water daily.  Patient was scheduled to have a FEES soon. SLP to work with patient on PMV per discussion in rounds. To remain NPO for now.  Patient needs adequate protein to promote wound healing. Discussed patient in ICU rounds today. Goal for nutrition is to maximize protein intake for healing while limiting calories to promote weight loss. Will adjust TF accordingly.  Patient is currently intubated on ventilator support MV: 9.5 L/min Temp (24hrs), Avg:98.4 F (36.9 C), Min:97.7 F (36.5 C), Max:98.9 F (37.2 C)   Height: Ht Readings from Last 1 Encounters:  08/16/13 5\' 3"  (1.6 m)   Ideal Weight: 115 lb (52.3 kg)  Weight Status:   Wt Readings from Last 1 Encounters:  08/17/13 245 lb 9.5 oz (111.4 kg)  08/16/13  246 lb 4.1 oz (111.7 kg)    Body mass index is 43.52 kg/(m^2).   Re-estimated needs:   Kcal: 1845 Protein: >/= 105 gm Fluid: 1.8-2 L  Skin: unstageable pressure ulcer to right heel; stage 3 to left and right buttocks  Diet Order: NPO   Intake/Output Summary (Last 24 hours) at 08/17/13 1128 Last data filed at 08/17/13 1100  Gross per 24 hour  Intake 355.13 ml  Output    925 ml  Net -569.87 ml    Last BM: PTA   Labs:   Recent Labs Lab 08/15/13 1820 08/16/13 0339 08/17/13 0243  NA 134* 135* 137  K 3.8 3.9 3.3*  CL 89* 92* 94*  CO2 32 33* 29  BUN 15 12 10   CREATININE 0.31* 0.30* 0.30*  CALCIUM 9.5 9.0 8.5  MG 2.2 2.1  --   PHOS 3.1 3.3  --   GLUCOSE 125* 174* 210*    CBG (last 3)   Recent Labs  08/17/13 0521 08/17/13 0621 08/17/13 0654  GLUCAP 190* 191* 175*    Scheduled Meds: . antiseptic oral rinse  15 mL Mouth Rinse q12n4p  . chlorhexidine  15 mL Mouth Rinse BID  . Chlorhexidine Gluconate Cloth  6 each Topical Q0600  . dexamethasone  2 mg Per Tube Daily  . diltiazem  30 mg Oral 3 times per day  . feeding supplement (PRO-STAT SUGAR FREE 64)  30 mL Per Tube q12n4p  . fentaNYL  100 mcg Transdermal Q72H  . heparin subcutaneous  5,000 Units Subcutaneous 3 times per day  .  ipratropium-albuterol  3 mL Nebulization Q4H  . levothyroxine  50 mcg Oral QAC breakfast  . metoprolol tartrate  50 mg Per Tube BID  . mupirocin ointment  1 application Nasal BID  . oxyCODONE  5 mg Oral TID  . pantoprazole sodium  40 mg Per Tube Daily  . sertraline  25 mg Oral Daily    Continuous Infusions: . feeding supplement (VITAL HIGH PROTEIN) 1,000 mL (08/17/13 0600)  . insulin (NOVOLIN-R) infusion 2.2 Units/hr (08/17/13 0900)     Joaquin Courts, RD, LDN, CNSC Pager [phone] After Hours Pager [phone]

## 2013-08-18 DIAGNOSIS — J9600 Acute respiratory failure, unspecified whether with hypoxia or hypercapnia: Secondary | ICD-10-CM

## 2013-08-18 DIAGNOSIS — Z5189 Encounter for other specified aftercare: Secondary | ICD-10-CM

## 2013-08-18 LAB — CULTURE, RESPIRATORY W GRAM STAIN

## 2013-08-18 LAB — GLUCOSE, CAPILLARY
GLUCOSE-CAPILLARY: 192 mg/dL — AB (ref 70–99)
GLUCOSE-CAPILLARY: 213 mg/dL — AB (ref 70–99)
GLUCOSE-CAPILLARY: 255 mg/dL — AB (ref 70–99)
Glucose-Capillary: 165 mg/dL — ABNORMAL HIGH (ref 70–99)
Glucose-Capillary: 227 mg/dL — ABNORMAL HIGH (ref 70–99)
Glucose-Capillary: 252 mg/dL — ABNORMAL HIGH (ref 70–99)

## 2013-08-18 LAB — CULTURE, RESPIRATORY

## 2013-08-18 MED ORDER — POTASSIUM CHLORIDE 20 MEQ/15ML (10%) PO LIQD
40.0000 meq | Freq: Once | ORAL | Status: AC
  Administered 2013-08-18: 40 meq
  Filled 2013-08-18: qty 30

## 2013-08-18 MED ORDER — CLONAZEPAM 0.5 MG PO TABS
0.5000 mg | ORAL_TABLET | Freq: Two times a day (BID) | ORAL | Status: DC | PRN
  Administered 2013-08-18 – 2013-08-26 (×3): 0.5 mg via ORAL
  Filled 2013-08-18 (×5): qty 1

## 2013-08-18 NOTE — Progress Notes (Signed)
SLP Cancellation Note  Patient Details Name: [name] MRN: [name] DOB: 04/23/1945   Cancelled treatment:       Reason Eval/Treat Not Completed: Per discussion with RRT this am, decision made to defer PMSV eval pending possible wean to ATC today.  Despite several attempts, pt unable to wean; back on full support this pm.  Will f/u next date to assess for inline PMSV.     Blenda MountsCouture, Jersi Mcmaster Laurice 08/18/2013, 5:18 PM

## 2013-08-18 NOTE — Progress Notes (Signed)
Placed back in full support due to inc RR

## 2013-08-18 NOTE — Progress Notes (Signed)
Pt did not tolerate ATC very anxious.  Pt given something for anxiety will attempt ATC again.

## 2013-08-18 NOTE — Progress Notes (Signed)
Name: [name] MRN: [id number] DOB: 18-Mar-1945    ADMISSION DATE:  08/15/2013 CONSULTATION DATE:  08/15/2013  [facility]ERRING MD :  [facility] PRIMARY SERVICE: PCCM  CHIEF COMPLAINT:  Tracheostomy air leak and food matter in the tracheostomy.  BRIEF PATIENT DESCRIPTION: 68 yo morbidly obese with tracheostomy placed in November 2014 after prolonged hospitalization for PNA and VDRF.  Seen in the trach clinic 7/7 and was noted to have a significant leak from the trach site. The patient also has been aspirating (she evidently passed a swallow evaluation and has been eating).  SIGNIFICANT EVENTS / STUDIES:  FEES 7/9: failed w/ sig dysphagia. SLP rec: NPO  LINES / TUBES: Trach (chronic) PEG (chronic)  CULTURES: Urine 7/7 >> E coli (pansens) Blood 7/7 >>    ANTIBIOTICS: Ceftriaxone 7/09 >>   INTERVAL HISTORY:  No acute events overnight. Working on Social research officer, government   VITAL SIGNS: Temp:  [98.1 F (36.7 C)-98.6 F (37 C)] 98.3 F (36.8 C) (07/10 1224) Pulse Rate:  [74-108] 74 (07/10 1501) Resp:  [12-27] 13 (07/10 1501) BP: (89-154)/(35-68) 125/56 mmHg (07/10 1224) SpO2:  [91 %-100 %] 100 % (07/10 1501) FiO2 (%):  [22 %-30 %] 30 % (07/10 1501) Weight:  [110.6 kg (243 lb 13.3 oz)-110.7 kg (244 lb 0.8 oz)] 110.7 kg (244 lb 0.8 oz) (07/10 0500)  VENTILATOR SETTINGS: Vent Mode:  [-] CPAP;PSV FiO2 (%):  [22 %-30 %] 30 % Set Rate:  [10 bmp] 10 bmp Vt Set:  [450 mL] 450 mL PEEP:  [5 cmH20] 5 cmH20 Pressure Support:  [5 cmH20-10 cmH20] 8 cmH20 Plateau Pressure:  [17 cmH20] 17 cmH20  INTAKE / OUTPUT: Intake/Output     07/09 0701 - 07/10 0700 07/10 0701 - 07/11 0700   I.V. (mL/kg) 10.9 (0.1)    Other 10 60   NG/GT 472.3 540   IV Piggyback 50    Total Intake(mL/kg) 543.2 (4.9) 600 (5.4)   Urine (mL/kg/hr) 240 (0.1) 450 (0.5)   Total Output 240 450   Net +303.2 +150        Stool Occurrence 1 x      PHYSICAL EXAMINATION: General:  Obese, NAD, RASS 0, + F/C Neuro:  Diffusely  weak, no focal deficits HEENT: WNL, trach site clean, voice weak, but able to communicate on vent  Cardiovascular:  RRR s M Lungs: clear anteriorly, occ scattered rhonchi  Abdomen:  Obese, soft, +BS, G tube site clean Ext: minimal symmetric edema  LABS:  Recent Labs Lab 08/15/13 1820 08/16/13 0339 08/17/13 0243  NA 134* 135* 137  K 3.8 3.9 3.3*  CL 89* 92* 94*  CO2 32 33* 29  BUN 15 12 10   CREATININE 0.31* 0.30* 0.30*  GLUCOSE 125* 174* 210*    Recent Labs Lab 08/15/13 1820 08/16/13 0339 08/17/13 0243  HGB 11.1* 10.4* 10.0*  HCT 35.3* 33.2* 33.2*  WBC 10.9* 10.4 9.1  PLT 371 345 348     CXR: Patient is rotated leftward. Stable enlarged cardiac silhouette. There is left lower lobe opacity. Right lung is clear.  ASSESSMENT / PLAN:   Chronic ventilator dependent respiratory failure Chronic trach status - trach tube appears to be well positioned Persistent LLL airspace disease OHS/OSA PlAN:    Cont vent support - settings reviewed and/or adjusted  Wean to PSV and/or ATC as tolerated  Cont vent bundle  Goal of daytime ATC and mandatory nocturnal vent  SLP eval for PMV   HTN, HLD Plan:   Cont  Diltiazem, Metoprolol pre home regimen  Hypokalemia Plan  Replace/recheck    severe dysphagia. Failed FEEs 7/9 Chronic G tube Morbid obesity Plan:    SUP: enteral famotidine  Cont TFs - calorie restricted  NPO   Mild chronic anemia Plan:   DVT px: SQ heparin  Monitor CBC intermittently  Transfuse per usual ICU guidelines    Asymptomatic pyuria and bacteriuria  Plan:  Micro and abx as above  Abx X 3 days planned   Hypothyroidism DM II Chronic steroids, indication is not clear, Polymyalgia rheumatica on record Plan:    Cont SSI  Cont l-thyroxine  Taper dexamethasone as tolerated  Profound deconditioning Plan:    Clonazepam, Fentanyl patch per preadmission regimen  Oxycodone PRN  PT/OT consult    , 3:21 PM  Attending  I have seen and  examined the patient with nurse practitioner/resident and agree with the note above.   Heber CarolinaBrent McQuaid, MD [facility] Pager: [phone] Cell: [phone] If no response, call [phone]

## 2013-08-18 NOTE — Evaluation (Signed)
Physical Therapy Evaluation Patient Details Name: Shelly KohlerRuth M Kelley MRN: [id number] DOB: 10/04/1945 Today's Date: 08/18/2013   History of Present Illness  Pt is a 68 y/o female who is a chronic trach patient, initially placed November 2014. Pt presents with a trach leak and suspician for aspiration pneumonia, as there were food particles in the trach.   Clinical Impression  Pt admitted with the above. Pt currently with functional limitations due to the deficits listed below (see PT Problem List). At the time of PT eval pt was able to tolerate minimal sitting EOB due to fatigue and dizziness. Total assist was required due to significant posterior lean. Recommend using lift to transfer pt OOB. Pt will benefit from skilled PT to increase their independence and safety with mobility to allow discharge to the venue listed below.       Follow Up Recommendations SNF;Supervision/Assistance - 24 hour    Equipment Recommendations  None recommended by PT    Recommendations for Other Services       Precautions / Restrictions Precautions Precautions: Fall Restrictions Weight Bearing Restrictions: No      Mobility  Bed Mobility Overal bed mobility: +2 for physical assistance;Needs Assistance Bed Mobility: Rolling;Sidelying to Sit;Sit to Sidelying Rolling: Max assist;+2 for physical assistance Sidelying to sit: Total assist;+2 for physical assistance     Sit to sidelying: Max assist;+2 for physical assistance General bed mobility comments: VC's for sequencing and technique. Hand-over-hand assist to reach for bed rails. Significant assist to achieve full sitting on EOB. Once sitting, total assist for balance due to posterior lean.   Transfers                 General transfer comment: Unable to attempt without lift equipment  Ambulation/Gait                Stairs            Wheelchair Mobility    Modified Rankin (Stroke Patients Only)       Balance Overall balance  assessment: Needs assistance Sitting-balance support: Feet supported;Bilateral upper extremity supported Sitting balance-Leahy Scale: Zero Sitting balance - Comments: Total assist for sitting balance due to posterior lean                                     Pertinent Vitals/Pain Vitals stable throughout session. During stressful transfer O2 sats decreased to 80%, however was able to be improved to 90% within 1 minute.     Home Living Family/patient expects to be discharged to:: Private residence Living Arrangements: Spouse/significant other Available Help at Discharge: Family;Available 24 hours/day Type of Home: House         Home Equipment: Hospital bed      Prior Function Level of Independence: Needs assistance   Gait / Transfers Assistance Needed: Husband states she has not walked or transferred herselt in 9 months.  ADL's / Homemaking Assistance Needed: Husband assists with all aspects of ADL's. Washing and toileting are completed in the hospital bed.   Comments: Husband reports she has been home for 5 weeks and prior to that she was being transferred with the lift to the recliner very rarely.      Hand Dominance   Dominant Hand: Right    Extremity/Trunk Assessment   Upper Extremity Assessment: Generalized weakness           Lower Extremity Assessment: Generalized weakness  Cervical / Trunk Assessment: Kyphotic  Communication   Communication: Tracheostomy  Cognition Arousal/Alertness: Lethargic Behavior During Therapy: Anxious Overall Cognitive Status: Difficult to assess                      General Comments General comments (skin integrity, edema, etc.): Lateral transfer from hospital bed to new bed with air mattress. Pt unable to assist, and transfer was a total assist +3, plus a 4th person to manage lines from the vent.    Exercises        Assessment/Plan    PT Assessment Patient needs continued PT services  PT  Diagnosis Generalized weakness   PT Problem List Decreased strength;Decreased range of motion;Decreased activity tolerance;Decreased balance;Decreased mobility;Decreased knowledge of use of DME;Decreased safety awareness;Decreased knowledge of precautions;Cardiopulmonary status limiting activity  PT Treatment Interventions DME instruction;Gait training;Stair training;Functional mobility training;Therapeutic activities;Therapeutic exercise;Neuromuscular re-education;Patient/family education   PT Goals (Current goals can be found in the Care Plan section) Acute Rehab PT Goals Patient Stated Goal: None stated PT Goal Formulation: Patient unable to participate in goal setting Time For Goal Achievement: 09/01/13 Potential to Achieve Goals: Fair    Frequency Min 2X/week   Barriers to discharge        Co-evaluation               End of Session Equipment Utilized During Treatment: Oxygen Activity Tolerance: Patient limited by fatigue Patient left: in bed;with family/visitor present;with nursing/sitter in room Nurse Communication: Mobility status (RN present during transfer to air mattress bed)         Time: 8469-6295 PT Time Calculation (min): 29 min   Charges:   PT Evaluation $Initial PT Evaluation Tier I: 1 Procedure PT Treatments $Therapeutic Activity: 23-37 mins   PT G CodesRuthann Cancer 08/18/2013, 4:45 PM  [name], PT, DPT Acute Rehabilitation Services Pager: [phone]

## 2013-08-18 NOTE — Progress Notes (Signed)
Placed pt back in full support very anxious.  Family  At bedside anxious as well concerned for pts care.

## 2013-08-19 LAB — COMPREHENSIVE METABOLIC PANEL
ALBUMIN: 2.9 g/dL — AB (ref 3.5–5.2)
ALT: 41 U/L — AB (ref 0–35)
ANION GAP: 11 (ref 5–15)
AST: 22 U/L (ref 0–37)
Alkaline Phosphatase: 78 U/L (ref 39–117)
BUN: 20 mg/dL (ref 6–23)
CO2: 31 mEq/L (ref 19–32)
Calcium: 8.9 mg/dL (ref 8.4–10.5)
Chloride: 99 mEq/L (ref 96–112)
Creatinine, Ser: 0.25 mg/dL — ABNORMAL LOW (ref 0.50–1.10)
GFR calc Af Amer: >90 mL/min (ref >90)
GFR calc non Af Amer: >90 mL/min (ref >90)
Glucose, Bld: 129 mg/dL — ABNORMAL HIGH (ref 70–99)
POTASSIUM: 4.2 meq/L (ref 3.7–5.3)
SODIUM: 141 meq/L (ref 137–147)
TOTAL PROTEIN: 5.7 g/dL — AB (ref 6.0–8.3)
Total Bilirubin: <0.2 mg/dL — ABNORMAL LOW (ref 0.3–1.2)

## 2013-08-19 LAB — CBC
HCT: 33.8 % — ABNORMAL LOW (ref 36.0–46.0)
Hemoglobin: 10.1 g/dL — ABNORMAL LOW (ref 12.0–15.0)
MCH: 30.1 pg (ref 26.0–34.0)
MCHC: 29.9 g/dL — AB (ref 30.0–36.0)
MCV: 100.6 fL — ABNORMAL HIGH (ref 78.0–100.0)
PLATELETS: 332 10*3/uL (ref 150–400)
RBC: 3.36 MIL/uL — ABNORMAL LOW (ref 3.87–5.11)
RDW: 15.6 % — AB (ref 11.5–15.5)
WBC: 10.2 10*3/uL (ref 4.0–10.5)

## 2013-08-19 LAB — GLUCOSE, CAPILLARY
GLUCOSE-CAPILLARY: 204 mg/dL — AB (ref 70–99)
GLUCOSE-CAPILLARY: 224 mg/dL — AB (ref 70–99)
Glucose-Capillary: 128 mg/dL — ABNORMAL HIGH (ref 70–99)
Glucose-Capillary: 185 mg/dL — ABNORMAL HIGH (ref 70–99)
Glucose-Capillary: 248 mg/dL — ABNORMAL HIGH (ref 70–99)
Glucose-Capillary: 225 mg/dL — ABNORMAL HIGH (ref 70–99)

## 2013-08-19 NOTE — Progress Notes (Signed)
SLP Cancellation Note  Patient Details Name: [name] MRN: [id number] DOB: 04/07/1945   Cancelled treatment:       Reason Eval/Treat Not Completed: Pt continues to work towards weaning from vent; currently on full support with recs by RRT to defer in-line valve trial this afternoon.  Will continue to follow.   Blenda MountsCouture, Iris Hairston Laurice 08/19/2013, 1:57 PM

## 2013-08-19 NOTE — Progress Notes (Signed)
Name: [name] MRN: [id number] DOB: 1945-02-17    ADMISSION DATE:  08/15/2013 CONSULTATION DATE:  08/15/2013  REFERRING MD :  [facility] PRIMARY SERVICE: PCCM  CHIEF COMPLAINT:  Tracheostomy air leak and food matter in the tracheostomy.  BRIEF PATIENT DESCRIPTION: 68 yo morbidly obese with tracheostomy placed in November 2014 after prolonged hospitalization for PNA and VDRF.  Seen in the trach clinic 7/7 and was noted to have a significant leak from the trach site. The patient also has been aspirating (she evidently passed a swallow evaluation and had been eating pta  SIGNIFICANT EVENTS / STUDIES:  FEES 7/9: failed w/ sig dysphagia. SLP rec: NPO  LINES / TUBES: Trach (chronic) PEG (chronic)  CULTURES: Urine 7/7 >> E coli (pansens) Blood 7/7 >>    ANTIBIOTICS: Ceftriaxone 7/09 >7/11     INTERVAL HISTORY:  No acute events overnight - did not tol weaning well today    VITAL SIGNS: Temp:  [98.1 F (36.7 C)-98.5 F (36.9 C)] 98.4 F (36.9 C) (07/11 1205) Pulse Rate:  [73-105] 88 (07/11 1205) Resp:  [10-30] 16 (07/11 1205) BP: (85-156)/(45-77) 156/77 mmHg (07/11 1205) SpO2:  [97 %-100 %] 97 % (07/11 1205) FiO2 (%):  [30 %] 30 % (07/11 1205) Weight:  [252 lb 10.4 oz (114.6 kg)] 252 lb 10.4 oz (114.6 kg) (07/11 0415)  VENTILATOR SETTINGS: Vent Mode:  [-] PRVC FiO2 (%):  [30 %] 30 % Set Rate:  [10 bmp] 10 bmp Vt Set:  [450 mL] 450 mL PEEP:  [5 cmH20] 5 cmH20 Pressure Support:  [8 cmH20] 8 cmH20 Plateau Pressure:  [16 cmH20-21 cmH20] 21 cmH20  INTAKE / OUTPUT: Intake/Output     07/10 0701 - 07/11 0700 07/11 0701 - 07/12 0700   I.V. (mL/kg)     Other 150 30   NG/GT 720 480   IV Piggyback 50    Total Intake(mL/kg) 920 (8) 510 (4.5)   Urine (mL/kg/hr) 450 (0.2) 1225 (1.8)   Total Output 450 1225   Net +470 -715          PHYSICAL EXAMINATION: General:  Obese, NAD, RASS 0, + F/C Neuro:  Diffusely weak, no focal deficits HEENT: WNL, trach site clean,  voice weak, but able to communicate on vent  Cardiovascular:  RRR s M Lungs: clear anteriorly, occ scattered rhonchi  Abdomen:  Obese, soft, +BS, G tube site clean Ext: minimal symmetric edema  LABS:  Recent Labs Lab 08/16/13 0339 08/17/13 0243 08/19/13 0231  NA 135* 137 141  K 3.9 3.3* 4.2  CL 92* 94* 99  CO2 33* 29 31  BUN 12 10 20   CREATININE 0.30* 0.30* 0.25*  GLUCOSE 174* 210* 129*    Recent Labs Lab 08/16/13 0339 08/17/13 0243 08/19/13 0231  HGB 10.4* 10.0* 10.1*  HCT 33.2* 33.2* 33.8*  WBC 10.4 9.1 10.2  PLT 345 348 332     CXR: 7/9 1. No interval change. Stable tracheostomy tube.  2. Cardiomegaly and left lower lobe opacity.   ASSESSMENT / PLAN:   Chronic ventilator dependent respiratory failure Chronic trach status - trach tube appears to be well positioned Persistent LLL airspace disease OHS/OSA PlAN:    Cont vent support - settings reviewed and/or adjusted  Wean to PSV and/or ATC as tolerated  Cont vent bundle  Goal of daytime ATC and mandatory nocturnal vent  SLP eval for PMV   HTN, HLD Plan:   Cont Diltiazem, Metoprolol pre home regimen  Hypokalemia Plan  Replace/recheck    severe dysphagia. Failed FEEs 7/9 Chronic G tube Morbid obesity Plan:    SUP: enteral famotidine  Cont TFs - calorie restricted  NPO   Mild chronic anemia Plan:   DVT px: SQ heparin  Monitor CBC intermittently  Transfuse per usual ICU guidelines    Asymptomatic pyuria and bacteriuria  Plan:  Micro and abx as above - off all abx as of 7/11     Hypothyroidism DM II Chronic steroids, indication is not clear, Polymyalgia rheumatica on record Plan:    Cont SSI  Cont l-thyroxine  Taper dexamethasone as tolerated  Profound deconditioning Plan:    Clonazepam, Fentanyl patch per preadmission regimen  Oxycodone PRN  PT/OT consult    Sandrea HughsMichael Wert, MD Pulmonary and Critical Care Medicine [facility] Cell [phone] After 5:30 PM or  weekends, call [phone]

## 2013-08-20 LAB — GLUCOSE, CAPILLARY
GLUCOSE-CAPILLARY: 143 mg/dL — AB (ref 70–99)
GLUCOSE-CAPILLARY: 118 mg/dL — AB (ref 70–99)
GLUCOSE-CAPILLARY: 162 mg/dL — AB (ref 70–99)
GLUCOSE-CAPILLARY: 242 mg/dL — AB (ref 70–99)
Glucose-Capillary: 234 mg/dL — ABNORMAL HIGH (ref 70–99)
Glucose-Capillary: 192 mg/dL — ABNORMAL HIGH (ref 70–99)

## 2013-08-20 NOTE — Progress Notes (Signed)
Name: Shelly KohlerRuth M Kelley MRN: [id number] DOB: 08/28/1945    ADMISSION DATE:  08/15/2013 CONSULTATION DATE:  08/15/2013  [facility] MD :  [facility] PRIMARY SERVICE: PCCM  CHIEF COMPLAINT:  Tracheostomy air leak and food matter in the tracheostomy.  BRIEF PATIENT DESCRIPTION: 68 yo morbidly obese with tracheostomy placed in November 2014 after prolonged hospitalization for PNA and VDRF.  Seen in the trach clinic 7/7 and was noted to have a significant leak from the trach site. The patient also has been aspirating (she evidently passed a swallow evaluation and had been eating pta  SIGNIFICANT EVENTS / STUDIES:  FEES 7/9: failed w/ sig dysphagia. SLP rec: NPO  LINES / TUBES: Trach (chronic) PEG (chronic)  CULTURES: Urine 7/7 >> E coli (pansens) Blood 7/7 >>    ANTIBIOTICS: Ceftriaxone 7/09 >7/11     INTERVAL HISTORY:  No acute events overnight - no weaning yet   VITAL SIGNS: Temp:  [97.7 F (36.5 C)-99 F (37.2 C)] 97.7 F (36.5 C) (07/12 0855) Pulse Rate:  [81-106] 81 (07/12 0758) Resp:  [11-24] 11 (07/12 0758) BP: (120-157)/(57-77) 134/59 mmHg (07/12 0855) SpO2:  [95 %-100 %] 97 % (07/12 0758) FiO2 (%):  [30 %] 30 % (07/12 0855)  VENTILATOR SETTINGS: Vent Mode:  [-] PSV;CPAP FiO2 (%):  [30 %] 30 % Set Rate:  [10 bmp] 10 bmp Vt Set:  [450 mL] 450 mL PEEP:  [5 cmH20] 5 cmH20 Pressure Support:  [10 cmH20] 10 cmH20 Plateau Pressure:  [21 cmH20-22 cmH20] 21 cmH20  INTAKE / OUTPUT: Intake/Output     07/11 0701 - 07/12 0700 07/12 0701 - 07/13 0700   Other 120 10   NG/GT 1110 30   IV Piggyback     Total Intake(mL/kg) 1230 (10.7) 40 (0.3)   Urine (mL/kg/hr) 2150 (0.8)    Total Output 2150     Net -920 +40        Stool Occurrence 1 x      PHYSICAL EXAMINATION: General:  Obese, NAD, RASS 0, + F/C Neuro:  Diffusely weak, no focal deficits HEENT: WNL, trach site clean, voice weak, but able to communicate on vent  Cardiovascular:  RRR s M Lungs: clear  anteriorly, occ scattered rhonchi  Abdomen:  Obese, soft, +BS, G tube site clean Ext: minimal symmetric edema  LABS:  Recent Labs Lab 08/16/13 0339 08/17/13 0243 08/19/13 0231  NA 135* 137 141  K 3.9 3.3* 4.2  CL 92* 94* 99  CO2 33* 29 31  BUN 12 10 20   CREATININE 0.30* 0.30* 0.25*  GLUCOSE 174* 210* 129*    Recent Labs Lab 08/16/13 0339 08/17/13 0243 08/19/13 0231  HGB 10.4* 10.0* 10.1*  HCT 33.2* 33.2* 33.8*  WBC 10.4 9.1 10.2  PLT 345 348 332     CXR: 7/9 1. No interval change. Stable tracheostomy tube.  2. Cardiomegaly and left lower lobe opacity.   ASSESSMENT / PLAN:   Chronic ventilator dependent respiratory failure Chronic trach status - trach tube appears to be well positioned Persistent LLL airspace disease OHS/OSA PlAN:    Cont vent support - settings reviewed  > needs out of bed before attempt wean if likely to be successful  Wean to PSV and/or ATC as tolerated in chair only for now  Cont vent bundle  Goal of daytime ATC and mandatory nocturnal vent  SLP eval for PMV   HTN, HLD Plan:   Cont Diltiazem, Metoprolol pre home regimen  Hypokalemia Plan  Replace/recheck  severe dysphagia. Failed FEEs 7/9 Chronic G tube Morbid obesity Plan:    SUP: enteral famotidine  Cont TFs - calorie restricted  NPO   Mild chronic anemia Plan:   DVT px: SQ heparin  Monitor CBC intermittently  Transfuse per usual ICU guidelines    Asymptomatic pyuria and bacteriuria  Plan:  Micro and abx as above - off all abx as of 7/11     Hypothyroidism DM II Chronic steroids, indication is not clear, Polymyalgia rheumatica on record Plan:    Cont SSI  Cont l-thyroxine  Taper dexamethasone as tolerated  Profound deconditioning Plan:    Clonazepam, Fentanyl patch per preadmission regimen  Oxycodone PRN  PT/OT consult    Extended discussion with nursing/RT re plan of care > priority is to mobilize to chair first if possible, then try  wean  Sandrea Hughs, MD Pulmonary and Critical Care Medicine [facility] Cell [phone] After 5:30 PM or weekends, call [phone]

## 2013-08-21 DIAGNOSIS — IMO0002 Reserved for concepts with insufficient information to code with codable children: Secondary | ICD-10-CM

## 2013-08-21 LAB — GLUCOSE, CAPILLARY
GLUCOSE-CAPILLARY: 135 mg/dL — AB (ref 70–99)
Glucose-Capillary: 168 mg/dL — ABNORMAL HIGH (ref 70–99)
Glucose-Capillary: 156 mg/dL — ABNORMAL HIGH (ref 70–99)
Glucose-Capillary: 180 mg/dL — ABNORMAL HIGH (ref 70–99)
Glucose-Capillary: 209 mg/dL — ABNORMAL HIGH (ref 70–99)
Glucose-Capillary: 206 mg/dL — ABNORMAL HIGH (ref 70–99)

## 2013-08-21 NOTE — Progress Notes (Signed)
PT Cancellation Note  Patient Details Name: Shelly KohlerRuth M Kelley MRN: [id number] DOB: 11/22/1945   Cancelled Treatment:    Reason Eval/Treat Not Completed: Patient declined, no reason specified.   Pt states she "has been through this before, is tired of dealing with all of this, and just wants to go home". Pt tearful during encounter and tells therapist she just wants to eat and drink. PT provided emotional support and education on benefits of therapy. Will continue to follow.    Ruthann CancerHamilton, Korver Graybeal 08/21/2013, 4:04 PM  Ruthann CancerLaura Hamilton, PT, DPT Acute Rehabilitation Services Pager: [phone]

## 2013-08-21 NOTE — Progress Notes (Signed)
Name: [name] MRN: [name] DOB: 10/15/1945    ADMISSION DATE:  08/15/2013 CONSULTATION DATE:  08/15/2013  [facility] MD :  [facility] PRIMARY SERVICE: PCCM  CHIEF COMPLAINT:  Tracheostomy air leak and food matter in the tracheostomy.  BRIEF PATIENT DESCRIPTION: 68 yo morbidly obese with tracheostomy placed in November 2014 after prolonged hospitalization for PNA and VDRF.  Seen in the trach clinic 7/7 and was noted to have a significant leak from the trach site. The patient also has been aspirating (she evidently passed a swallow evaluation and had been eating pta  SIGNIFICANT EVENTS / STUDIES:  FEES 7/9: failed w/ sig dysphagia. SLP rec: NPO  LINES / TUBES: Trach (chronic) PEG (chronic)  CULTURES: Urine 7/7 >> E coli (pansens) Blood 7/7 >>    ANTIBIOTICS: Ceftriaxone 7/09 >7/11     INTERVAL HISTORY:  No acute events overnight - to wean shortly. Wants food badly, threatens to leave if not given.    VITAL SIGNS: Temp:  [97.8 F (36.6 C)-98.7 F (37.1 C)] 97.8 F (36.6 C) (07/13 0808) Pulse Rate:  [78-92] 83 (07/13 0916) Resp:  [12-21] 12 (07/13 0916) BP: (92-160)/(48-98) 93/67 mmHg (07/13 0916) SpO2:  [95 %-100 %] 98 % (07/13 0916) FiO2 (%):  [30 %] 30 % (07/13 0916) Weight:  [116 kg (255 lb 11.7 oz)] 116 kg (255 lb 11.7 oz) (07/13 0400)  VENTILATOR SETTINGS: Vent Mode:  [-] PSV FiO2 (%):  [30 %] 30 % Set Rate:  [10 bmp] 10 bmp Vt Set:  [450 mL] 450 mL PEEP:  [5 cmH20] 5 cmH20 Pressure Support:  [10 cmH20] 10 cmH20 Plateau Pressure:  [19 cmH20-22 cmH20] 22 cmH20  INTAKE / OUTPUT: Intake/Output     07/12 0701 - 07/13 0700 07/13 0701 - 07/14 0700   Other 110 20   NG/GT 330 450   Total Intake(mL/kg) 440 (3.8) 470 (4.1)   Urine (mL/kg/hr) 1525 (0.5)    Total Output 1525     Net -1085 +470          PHYSICAL EXAMINATION: General:  Obese, NAD, RASS 0, + F/C Neuro:  Diffusely weak, no focal deficits HEENT: WNL, trach site clean, voice weak, but  able to communicate on vent  Cardiovascular:  RRR s M Lungs: distant breath sounds, clear anteriorly, occ scattered rhonchi  Abdomen:  Obese, soft, +BS, G tube site clean Ext: minimal symmetric edema  LABS:  Recent Labs Lab 08/16/13 0339 08/17/13 0243 08/19/13 0231  NA 135* 137 141  K 3.9 3.3* 4.2  CL 92* 94* 99  CO2 33* 29 31  BUN 12 10 20   CREATININE 0.30* 0.30* 0.25*  GLUCOSE 174* 210* 129*    Recent Labs Lab 08/16/13 0339 08/17/13 0243 08/19/13 0231  HGB 10.4* 10.0* 10.1*  HCT 33.2* 33.2* 33.8*  WBC 10.4 9.1 10.2  PLT 345 348 332     CXR: 7/9 1. No interval change. Stable tracheostomy tube.  2. Cardiomegaly and left lower lobe opacity.   ASSESSMENT / PLAN:   Chronic ventilator dependent respiratory failure Chronic trach status - trach tube appears to be well positioned Persistent LLL airspace disease OHS/OSA PlAN:    Wean to PSV and/or ATC as tolerated, goal 6 hr TC today if able  OOB to chair may assist tolerance  Cont vent bundle  Goal of daytime ATC and mandatory nocturnal vent  SLP eval for PMV/food if maintains wean, would have an improved swallow likely off vent  Even if  off 3 hr twice a day and repeat swallow ok , could consider feeding and intake adaquate  HTN, HLD Plan:   Cont Diltiazem, Metoprolol per home regimen  Hypokalemia Plan  Replace/recheck    severe dysphagia. Failed FEEs 7/9 Chronic G tube Morbid obesity Plan:    SUP: enteral famotidine  Cont TFs - calorie restricted  NPO  ST eval once on ATC, even if 2-4 hrs off at each time  wxplained risk death from asp if eats now on vent   Mild chronic anemia Plan:   DVT px: SQ heparin  Monitor CBC intermittently  Transfuse per usual ICU guidelines    Asymptomatic pyuria and bacteriuria  Plan:  Micro and abx as above - off all abx as of 7/11     Hypothyroidism DM II Chronic steroids, indication is not clear, Polymyalgia rheumatica on record Plan:    Cont SSI  Cont  l-thyroxine  Taper dexamethasone as tolerated  Profound deconditioning Plan:    Clonazepam, Fentanyl patch per preadmission regimen  Oxycodone PRN  PT/OT consult    Global: Goal for family/patient is to go home and not to SNF. She has full nursing support at home (16hrs/day m-f and 12hr/day sat-sun). She also desires to eat, but failed FEES 7/9 and remains unable to wean to ATC for further SLP eval.   [name], ACNP [place] [facility] Pager [phone] or [phone]  I have fully examined this patient and agree with above findings.    And edited infull  Mcarthur Rossetti. Tyson Alias, MD, FACP Pgr: [phone] [place] [facility]

## 2013-08-22 ENCOUNTER — Inpatient Hospital Stay (HOSPITAL_COMMUNITY): Payer: Medicare Other

## 2013-08-22 LAB — CBC
HCT: 36.5 % (ref 36.0–46.0)
HEMOGLOBIN: 10.7 g/dL — AB (ref 12.0–15.0)
MCH: 30.1 pg (ref 26.0–34.0)
MCHC: 29.3 g/dL — ABNORMAL LOW (ref 30.0–36.0)
MCV: 102.5 fL — AB (ref 78.0–100.0)
Platelets: 284 10*3/uL (ref 150–400)
RBC: 3.56 MIL/uL — ABNORMAL LOW (ref 3.87–5.11)
RDW: 15.5 % (ref 11.5–15.5)
WBC: 10.3 10*3/uL (ref 4.0–10.5)

## 2013-08-22 LAB — GLUCOSE, CAPILLARY
GLUCOSE-CAPILLARY: 169 mg/dL — AB (ref 70–99)
GLUCOSE-CAPILLARY: 166 mg/dL — AB (ref 70–99)
GLUCOSE-CAPILLARY: 220 mg/dL — AB (ref 70–99)
GLUCOSE-CAPILLARY: 282 mg/dL — AB (ref 70–99)
Glucose-Capillary: 212 mg/dL — ABNORMAL HIGH (ref 70–99)
Glucose-Capillary: 229 mg/dL — ABNORMAL HIGH (ref 70–99)

## 2013-08-22 LAB — CULTURE, BLOOD (ROUTINE X 2)
CULTURE: NO GROWTH
Culture: NO GROWTH

## 2013-08-22 LAB — BASIC METABOLIC PANEL
Anion gap: 11 (ref 5–15)
BUN: 24 mg/dL — ABNORMAL HIGH (ref 6–23)
CALCIUM: 9.5 mg/dL (ref 8.4–10.5)
CHLORIDE: 95 meq/L — AB (ref 96–112)
CO2: 36 meq/L — AB (ref 19–32)
Creatinine, Ser: 0.21 mg/dL — ABNORMAL LOW (ref 0.50–1.10)
GFR calc Af Amer: >90 mL/min (ref >90)
GFR calc non Af Amer: >90 mL/min (ref >90)
GLUCOSE: 165 mg/dL — AB (ref 70–99)
Potassium: 4 mEq/L (ref 3.7–5.3)
SODIUM: 142 meq/L (ref 137–147)

## 2013-08-22 MED ORDER — INSULIN GLARGINE 100 UNIT/ML ~~LOC~~ SOLN
25.0000 [IU] | Freq: Two times a day (BID) | SUBCUTANEOUS | Status: DC
  Administered 2013-08-22 – 2013-08-24 (×4): 25 [IU] via SUBCUTANEOUS
  Filled 2013-08-22 (×5): qty 0.25

## 2013-08-22 MED ORDER — INSULIN ASPART 100 UNIT/ML ~~LOC~~ SOLN: 3.0000 [IU] | SUBCUTANEOUS | Status: DC

## 2013-08-22 MED ORDER — FUROSEMIDE 10 MG/ML IJ SOLN
40.0000 mg | Freq: Once | INTRAMUSCULAR | Status: AC
Start: 2013-08-22 — End: 2013-08-22
  Administered 2013-08-22: 40 mg via INTRAVENOUS
  Filled 2013-08-22: qty 4

## 2013-08-22 NOTE — Progress Notes (Signed)
Name: [name] MRN: [id number] DOB: 11/26/1945    ADMISSION DATE:  08/15/2013 CONSULTATION DATE:  08/15/2013  [facility]ING MD :  [facility] PRIMARY SERVICE: PCCM  CHIEF COMPLAINT:  Tracheostomy air leak and food matter in the tracheostomy.  BRIEF PATIENT DESCRIPTION: 68 yo morbidly obese with tracheostomy placed in November 2014 after prolonged hospitalization for PNA and VDRF.  Seen in the trach clinic 7/7 and was noted to have a significant leak from the trach site. The patient also has been aspirating (she evidently passed a swallow evaluation and had been eating pta  SIGNIFICANT EVENTS / STUDIES:  FEES 7/9: failed w/ sig dysphagia. SLP rec: NPO  LINES / TUBES: Trach (chronic) PEG (chronic)  CULTURES: Urine 7/7 >> E coli (pansens) Blood 7/7 >>    ANTIBIOTICS: Ceftriaxone 7/09 >7/11     INTERVAL HISTORY:  No trach collar yesterday. Wants to eat, drink. No acute events overnight.    VITAL SIGNS: Temp:  [97.7 F (36.5 C)-99.1 F (37.3 C)] 98.4 F (36.9 C) (07/14 0756) Pulse Rate:  [87-108] 99 (07/14 0756) Resp:  [10-24] 15 (07/14 0756) BP: (116-144)/(54-69) 116/54 mmHg (07/14 0756) SpO2:  [94 %-100 %] 97 % (07/14 0756) FiO2 (%):  [30 %] 30 % (07/14 0756) Weight:  [116 kg (255 lb 11.7 oz)] 116 kg (255 lb 11.7 oz) (07/14 0435)  VENTILATOR SETTINGS: Vent Mode:  [-] CPAP FiO2 (%):  [30 %] 30 % Set Rate:  [10 bmp] 10 bmp Vt Set:  [450 mL] 450 mL PEEP:  [5 cmH20] 5 cmH20 Pressure Support:  [10 cmH20] 10 cmH20 Plateau Pressure:  [15 cmH20-21 cmH20] 17 cmH20  INTAKE / OUTPUT: Intake/Output     07/13 0701 - 07/14 0700 07/14 0701 - 07/15 0700   Other 230    NG/GT 1080    Total Intake(mL/kg) 1310 (11.3)    Urine (mL/kg/hr) 1785 (0.6) 200 (0.5)   Total Output 1785 200   Net -475 -200          PHYSICAL EXAMINATION: General:  Obese, NAD, RASS 0, + F/C Neuro:  Diffusely weak, no focal deficits HEENT: WNL, trach site clean, voice weak, but able to  communicate on vent  Cardiovascular:  RRR s M Lungs: distant breath sounds, clear anteriorly, occ scattered rhonchi  Abdomen:  Obese, soft, +BS, G tube site clean Ext: minimal symmetric edema  LABS:  Recent Labs Lab 08/17/13 0243 08/19/13 0231 08/22/13 0330  NA 137 141 142  K 3.3* 4.2 4.0  CL 94* 99 95*  CO2 29 31 36*  BUN 10 20 24*  CREATININE 0.30* 0.25* 0.21*  GLUCOSE 210* 129* 165*    Recent Labs Lab 08/17/13 0243 08/19/13 0231 08/22/13 0330  HGB 10.0* 10.1* 10.7*  HCT 33.2* 33.8* 36.5  WBC 9.1 10.2 10.3  PLT 348 332 284     CXR: 7/9 1. Persistent Left infiltrate. 2. Cardiomegaly   ASSESSMENT / PLAN:   Chronic ventilator dependent respiratory failure Chronic trach status - trach tube appears to be well positioned Persistent LLL airspace disease OHS/OSA PlAN:    Wean to PSV and/or ATC as tolerated, goal 6 hr TC today if able  OOB to chair may assist tolerance  Lasix 40 MG one time dose.   Cont vent bundle  Goal of daytime ATC and mandatory nocturnal vent  SLP eval for PMV/food if maintains wean, would have an improved swallow likely off vent  Even if off 3 hr twice a day and  repeat swallow ok , could consider feeding and intake adaquate  HTN, HLD Plan:   Cont Diltiazem, Metoprolol per home regimen  Hypokalemia Plan  Replace/recheck    severe dysphagia. Failed FEEs 7/9 Chronic G tube Morbid obesity Plan:    SUP: enteral famotidine  Cont TFs - calorie restricted  NPO  ST eval once on ATC, even if 2-4 hrs off at each time  Explained risk death from asp if eats now on vent   Mild chronic anemia Plan:   DVT px: SQ heparin  Monitor CBC intermittently  Transfuse per usual ICU guidelines    Asymptomatic pyuria and bacteriuria  Plan:  Micro and abx as above - off all abx as of 7/11     Hypothyroidism DM II Chronic steroids, indication is not clear, Polymyalgia rheumatica on record Plan:    Cont SSI  Lantus to 25 BID 7/14  Added  scheduled novolog per diabetic coordinator rec's. 7/14  Cont l-thyroxine  Taper dexamethasone as tolerated  Profound deconditioning Plan:    Clonazepam, Fentanyl patch per preadmission regimen  Oxycodone PRN  PT/OT consult    Global: Goal for family/patient is to go home and not to SNF. She has full nursing support at home (16hrs/day m-f and 12hr/day sat-sun). She also desires to eat, but failed FEES 7/9 and remains unable to wean to ATC for further SLP eval.    [name], ACNP [place] [facility] Pager [phone] or [phone]  Patient seen and examined, agree with above note.  I dictated the care and orders written for this patient under my direction.  [name], MD [phone]

## 2013-08-22 NOTE — Progress Notes (Signed)
PT Cancellation Note  Patient Details Name: Shelly KohlerRuth M Kelley MRN: [id number] DOB: 03/26/1945   Cancelled Treatment:    Reason Eval/Treat Not Completed: Patient declined, no reason specified. Pt continues to state that she has "been through all this before, and is tired of it all." Says she "just wants to go home". Will attempt again tomorrow. If pt continues to refuse therapy services, will d/c as she is at a total assist level and appropriate for use of lift for OOB. This is apparently her baseline.    Ruthann CancerHamilton, [place] Catlin 08/22/2013, 3:38 PM  Ruthann CancerLaura Hamilton, PT, DPT Acute Rehabilitation Services Pager: [phone]

## 2013-08-22 NOTE — Progress Notes (Signed)
Pt placed back on the vent at this time.  Speech came and worked with her with her PMV and she wore with no problems.  At this time, she is back on the vent.  Pt tolerated ATC for 4 hours today.  Tomorrow morning speech is to do swallow test after ATC is started.

## 2013-08-22 NOTE — Progress Notes (Signed)
Patient placed on ATC 40% O2.  Pt is tolerating well and SLP will be notified for a PMV.  RT will continue to monitor.

## 2013-08-22 NOTE — Progress Notes (Signed)
Inpatient Diabetes Program Recommendations  AACE/ADA: New Consensus Statement on Inpatient Glycemic Control (2013)  Target Ranges:  Prepandial:   less than 140 mg/dL      Peak postprandial:   less than 180 mg/dL (1-2 hours)      Critically ill patients:  140 - 180 mg/dL   Results for [name], [name] (MRN [id number]) as of 08/22/2013 08:36  Ref. Range 08/21/2013 08:06 08/21/2013 12:11 08/21/2013 16:02 08/21/2013 20:24 08/21/2013 23:37 08/22/2013 04:15 08/22/2013 07:50  Glucose-Capillary Latest Range: 70-99 mg/dL 409156 (H) 811180 (H) 914209 (H) 206 (H) 212 (H) 169 (H) 166 (H)   Diabetes history: DM2 Outpatient Diabetes medications: Lantus 45 units BID Current orders for Inpatient glycemic control: Lantus 20 units BID, Novolog 0-20 units Q4H  Inpatient Diabetes Program Recommendations Insulin - Basal: Please consider increasing basal insulin to Lantus 25 units BID. Insulin - Meal Coverage: May want to consider ordering Novolog 3 units Q4H for tube feeding coverage.  Note: Patient received a total of Novolog 28 units on 08/21/13 for correction.   Thanks, Orlando PennerMarie Najae Rathert, RN, MSN, CCRN Diabetes Coordinator Inpatient Diabetes Program [phone] (Team Pager) [phone] ([facility] office) [phone] [facility] office)

## 2013-08-22 NOTE — Progress Notes (Signed)
Patient evaluated for community based chronic disease management services with [facility] as a benefit of patient's [employer]. Spoke with patient at bedside to explain [facility] services.  Services have been accepted with written consent.  Her spouse Adah PerlRandy Alred is her authorized contact at [phone].  Patient feels that she would benefit from some coordination of care assistance as her husband has recently become challenged with her needs.  Patient will receive a post discharge transition of care call and will be evaluated for monthly home visits for assessments and disease process education.  Left contact information and [facility] literature at bedside. Made Inpatient Case Manager aware that [facility] following. Of note, [facility] services does not replace or interfere with any services that are arranged by inpatient case management or social work.  For additional questions or referrals please contact Anibal Hendersonim Henderson BSN RN [facility] Hospital Liaison at [phone].

## 2013-08-22 NOTE — Evaluation (Signed)
Passy-Muir Speaking Valve - Evaluation Patient Details  Name: [name] IGLESIA MRN: [id number] Date of Birth: 09-27-1945  Today's Date: 08/22/2013 Time: 1405-1500 SLP Time Calculation (min): 55 min  Past Medical History:  Past Medical History  Diagnosis Date  . Fibromyalgia   . Osteoarthritis   . Asthma   . Type II or unspecified type diabetes mellitus without mention of complication, not stated as uncontrolled   . Esophageal reflux   . Allergic rhinitis, cause unspecified   . OSA on CPAP   . CAD (coronary artery disease)   . Other and unspecified hyperlipidemia    Past Surgical History:  Past Surgical History  Procedure Laterality Date  . Vesicovaginal fistula closure w/ tah    . Tonsillectomy    . Eye surgery    . Tracheostomy tube placement N/A 12/15/2012    Procedure: TRACHEOSTOMY;  Surgeon: Drema Halon, MD;  Location: [facility] OR;  Service: ENT;  Laterality: N/A;  . Tee without cardioversion N/A 01/20/2013    Procedure: TRANSESOPHAGEAL ECHOCARDIOGRAM (TEE);  Surgeon: Vesta Mixer, MD;  Location: [facility] ENDOSCOPY;  Service: Cardiovascular;  Laterality: N/A;   HPI:  12 yoF former smoker, followed for asthma/ bronchitis, allergic rhinitis, Sleep apnea, OHS complicated by GERD, DM, polymyalgia rheumatica and CAD. Tracheostomy placed in NOvember 2014 after prolonged hospitalization for PNA and VDRF. Pt was  referred by Dr. Maple Hudson for OP MBS due to exacerbation of trachel infection with green discharge, possibly related to food being found in trach stoma or during suction. Pt has been made NPO and feeding is restricted to PEG. MBS complete 7/7 with limited results due to patient body habitus. Subsequently seen in the trach clinic 7/7 and was noted to have a significant leak from the trach site. Admitted to [facility], then transferred to [facility] for management of trach, vent, and FEES to evaluate swallow. In-line PMSV evaluation also pending completion 7/10.    Assessment / Plan /  Recommendation Clinical Impression  PMSV evaluation completed.  Called by RRT after pt taken off vent; on ATC.  Cuff was deflated and valve placed.  Pt with excellent toleration; used valve in SLP presence for 55 minutes, with stable VS.  Sp02 remained at 97% or greater throughout session.  Treatment focused on pacing respirations for efficient speech and increasing  volume. Pt required mod verbal cues to interject inhalations to accommodate length of utterance.   She was conversant about her desire to begin eating.  We discussed plan to address swallowing (FEES next date). Pt verbalizes understanding and willingness to participate in study tomorrow.    Pt left with PMSV in place; RN to intermittently monitor and RRT informed about results and plan.      SLP Assessment  Patient needs continued Speech Language Pathology Services    Follow Up Recommendations  Home health SLP    Frequency and Duration min 3x week  2 weeks   PLAN:                                                                   Pt may use PMSV when on ATC and after cuff is deflated. Pt requires intermittent supervision with use of valve. Remove if SpO2 drops below 90% FEES next date   SLP  Goals Potential to Achieve Goals: Good   PMSV Trial  PMSV was placed for: 55 minutes Able to redirect subglottic air through upper airway: Yes Able to Attain Phonation: Yes Voice Quality: Low vocal intensity Able to Expectorate Secretions: No attempts Level of Secretion Expectoration with PMSV: Not observed Breath Support for Phonation: Mildly decreased Intelligibility: Intelligibility reduced Word: 75-100% accurate Phrase: 75-100% accurate Sentence: 75-100% accurate Conversation: 75-100% accurate Respirations During Trial: 16 SpO2 During Trial: 99 % Pulse During Trial: 83 Behavior: Alert;Controlled;Cooperative   Tracheostomy Tube    #6 Portex   Vent Dependency  FiO2 (%): 40 %    Cuff Deflation Trial Tolerated Cuff  Deflation: Yes Length of Time for Cuff Deflation Trial: one hour Behavior: Alert;Cooperative;Expresses self well   Blenda MountsCouture, Lorely Bubb [name] 08/22/2013, 3:16 PM Marchelle FolksAmanda L. Samson Fredericouture, [place]MA CCC/SLP Pager [phone]

## 2013-08-23 ENCOUNTER — Inpatient Hospital Stay (HOSPITAL_COMMUNITY): Payer: Medicare Other

## 2013-08-23 DIAGNOSIS — I469 Cardiac arrest, cause unspecified: Secondary | ICD-10-CM

## 2013-08-23 LAB — POCT I-STAT 3, ART BLOOD GAS (G3+)
ACID-BASE EXCESS: 15 mmol/L — AB (ref 0.0–2.0)
BICARBONATE: 42.5 meq/L — AB (ref 20.0–24.0)
O2 Saturation: 99 %
Patient temperature: 98.4
TCO2: 44 mmol/L (ref 0–100)
pCO2 arterial: 63.7 mmHg (ref 35.0–45.0)
pH, Arterial: 7.432 (ref 7.350–7.450)
pO2, Arterial: 123 mmHg — ABNORMAL HIGH (ref 80.0–100.0)

## 2013-08-23 LAB — CBC
HEMATOCRIT: 36 % (ref 36.0–46.0)
Hemoglobin: 11 g/dL — ABNORMAL LOW (ref 12.0–15.0)
MCH: 30.6 pg (ref 26.0–34.0)
MCHC: 30.6 g/dL (ref 30.0–36.0)
MCV: 100.3 fL — AB (ref 78.0–100.0)
Platelets: 268 10*3/uL (ref 150–400)
RBC: 3.59 MIL/uL — ABNORMAL LOW (ref 3.87–5.11)
RDW: 15.2 % (ref 11.5–15.5)
WBC: 13.9 10*3/uL — ABNORMAL HIGH (ref 4.0–10.5)

## 2013-08-23 LAB — BLOOD GAS, ARTERIAL
ACID-BASE EXCESS: 10 mmol/L — AB (ref 0.0–2.0)
Bicarbonate: 36.8 mEq/L — ABNORMAL HIGH (ref 20.0–24.0)
DRAWN BY: 244801
FIO2: 1 %
LHR: 10 {breaths}/min
O2 SAT: 100 %
PEEP: 5 cmH2O
Patient temperature: 98.6
TCO2: 39.2 mmol/L (ref 0–100)
VT: 450 mL
pCO2 arterial: 80 mmHg (ref 35.0–45.0)
pH, Arterial: 7.284 — ABNORMAL LOW (ref 7.350–7.450)
pO2, Arterial: 392 mmHg — ABNORMAL HIGH (ref 80.0–100.0)

## 2013-08-23 LAB — TROPONIN I: Troponin I: <0.3 ng/mL (ref <0.30)

## 2013-08-23 LAB — BASIC METABOLIC PANEL
Anion gap: 12 (ref 5–15)
BUN: 28 mg/dL — AB (ref 6–23)
CHLORIDE: 90 meq/L — AB (ref 96–112)
CO2: 36 mEq/L — ABNORMAL HIGH (ref 19–32)
Calcium: 9.3 mg/dL (ref 8.4–10.5)
Creatinine, Ser: 0.21 mg/dL — ABNORMAL LOW (ref 0.50–1.10)
GFR calc Af Amer: >90 mL/min (ref >90)
GLUCOSE: 200 mg/dL — AB (ref 70–99)
POTASSIUM: 3.3 meq/L — AB (ref 3.7–5.3)
Sodium: 138 mEq/L (ref 137–147)

## 2013-08-23 LAB — GLUCOSE, CAPILLARY
GLUCOSE-CAPILLARY: 173 mg/dL — AB (ref 70–99)
GLUCOSE-CAPILLARY: 174 mg/dL — AB (ref 70–99)
GLUCOSE-CAPILLARY: 252 mg/dL — AB (ref 70–99)
Glucose-Capillary: 173 mg/dL — ABNORMAL HIGH (ref 70–99)
Glucose-Capillary: 211 mg/dL — ABNORMAL HIGH (ref 70–99)
Glucose-Capillary: 245 mg/dL — ABNORMAL HIGH (ref 70–99)
Glucose-Capillary: 246 mg/dL — ABNORMAL HIGH (ref 70–99)

## 2013-08-23 MED ORDER — COLLAGENASE 250 UNIT/GM EX OINT
TOPICAL_OINTMENT | Freq: Every day | CUTANEOUS | Status: DC
  Administered 2013-08-23: 1 via TOPICAL
  Administered 2013-08-24 – 2013-08-29 (×5): via TOPICAL
  Filled 2013-08-23: qty 30

## 2013-08-23 MED ORDER — ONDANSETRON HCL 4 MG/2ML IJ SOLN: 4.0000 mg | Freq: Four times a day (QID) | INTRAMUSCULAR | Status: DC | PRN

## 2013-08-23 MED ORDER — ONDANSETRON HCL 4 MG/2ML IJ SOLN
INTRAMUSCULAR | Status: AC
  Administered 2013-08-23: 4 mg
  Filled 2013-08-23: qty 2

## 2013-08-23 MED ORDER — LABETALOL HCL 5 MG/ML IV SOLN: 20.0000 mg | INTRAVENOUS | Status: DC | PRN

## 2013-08-23 MED ORDER — MECLIZINE HCL 25 MG PO TABS
25.0000 mg | ORAL_TABLET | Freq: Three times a day (TID) | ORAL | Status: DC | PRN
  Administered 2013-08-23 – 2013-08-25 (×2): 25 mg via ORAL
  Filled 2013-08-23 (×3): qty 1

## 2013-08-23 NOTE — Progress Notes (Signed)
CODE BLUE NOTE  Patient Name: Shelly KohlerRuth M Kelley   MRN: [id number]   Date of Birth/ Sex: 07/31/1945 , female      Admission Date: 08/15/2013  Attending Provider: Alyson ReedyWesam G Yacoub, MD  Primary Diagnosis: <principal problem not specified>    Indication: Pt was in her usual state of health until this AM, when she was noted to be hypoxic with O2 sat to 20% during swallow eval. Code blue was subsequently called. At the time of arrival on scene, ACLS protocol was underway.    Technical Description:  - CPR performance duration:  0  minute  - Was defibrillation or cardioversion used? No   - Was external pacer placed? No  - Was patient intubated pre/post CPR? No, patient with pre-existing trach    Medications Administered: Y = Yes; Blank = No Amiodarone    Atropine    Calcium    Epinephrine    Lidocaine    Magnesium    Norepinephrine    Phenylephrine    Sodium bicarbonate    Vasopressin      Post CPR evaluation:  - Final Status - Was patient successfully resuscitated ? Yes - What is current rhythm? Sinus - What is current hemodynamic status? Stable   Miscellaneous Information:  - Labs sent, including: None  - Primary team notified?  Yes  - Family Notified? Yes  - Additional notes/ transfer status:  Patient likely aspirated during swallow study at bedside.  Patient with preexisting trach.        Shirlee LatchAngela Keneisha Heckart, MD  08/23/2013, 11:48 AM

## 2013-08-23 NOTE — Progress Notes (Addendum)
Name: [name] MRN: [id number] DOB: 07/30/1945    ADMISSION DATE:  08/15/2013 CONSULTATION DATE:  08/15/2013  REFERRING MD :  [facility] PRIMARY SERVICE: PCCM  CHIEF COMPLAINT:  Tracheostomy air leak and food matter in the tracheostomy.  BRIEF PATIENT DESCRIPTION: 68 yo morbidly obese with tracheostomy placed in November 2014 after prolonged hospitalization for PNA and VDRF.  Seen in the trach clinic 7/7 and was noted to have a significant leak from the trach site. The patient also has been aspirating (she evidently passed a swallow evaluation and had been eating pta  SIGNIFICANT EVENTS / STUDIES:  FEES 7/9: failed w/ sig dysphagia. SLP rec: NPO 7/15: Respiratory arrest  LINES / TUBES: Trach (chronic) PEG (chronic)  CULTURES: Urine 7/7 >> E coli (pansens) Blood 7/7 >>    ANTIBIOTICS: Ceftriaxone 7/09 >7/11  INTERVAL HISTORY:  Heard code alarm while on unit and went to room. Was told by nursing staff that she had suffered what appeared to suffer a primary respiratory arrest, which progressed to loss of pulses. Reportedly received CPR for 45 seconds, no drugs, and has ROSC (see code sheet for details). At my arrival she has pulses, is hypertensive 215/160 and is on ventilator on PRVC at normal settings. Also with some vomiting.  VITAL SIGNS: Temp:  [98.2 F (36.8 C)-98.6 F (37 C)] 98.5 F (36.9 C) (07/15 0748) Pulse Rate:  [77-89] 79 (07/15 1041) Resp:  [10-26] 19 (07/15 0837) BP: (105-167)/(45-115) 127/62 mmHg (07/15 1041) SpO2:  [96 %-100 %] 99 % (07/15 0839) FiO2 (%):  [30 %-100 %] 100 % (07/15 1150) Weight:  [116 kg (255 lb 11.7 oz)] 116 kg (255 lb 11.7 oz) (07/15 0400)  VENTILATOR SETTINGS: Vent Mode:  [-] PRVC FiO2 (%):  [30 %-100 %] 100 % Set Rate:  [10 bmp] 10 bmp Vt Set:  [450 mL] 450 mL PEEP:  [5 cmH20] 5 cmH20 Plateau Pressure:  [13 cmH20-21 cmH20] 21 cmH20  INTAKE / OUTPUT: Intake/Output     07/14 0701 - 07/15 0700 07/15 0701 - 07/16 0700   Other     NG/GT 630    Total Intake(mL/kg) 630 (5.4)    Urine (mL/kg/hr) 1800 (0.6)    Total Output 1800     Net -1170 0          PHYSICAL EXAMINATION: General:  Obese, NAD, RASS 0, + F/C Neuro:  Diffusely weak, no focal deficits HEENT: WNL, trach site clean, voice weak, but able to communicate on vent  Cardiovascular:  RRR s M Lungs: distant breath sounds, clear anteriorly, occ scattered rhonchi  Abdomen:  Obese, soft, +BS, G tube site clean Ext: minimal symmetric edema  LABS:  Recent Labs Lab 08/17/13 0243 08/19/13 0231 08/22/13 0330  NA 137 141 142  K 3.3* 4.2 4.0  CL 94* 99 95*  CO2 29 31 36*  BUN 10 20 24*  CREATININE 0.30* 0.25* 0.21*  GLUCOSE 210* 129* 165*    Recent Labs Lab 08/17/13 0243 08/19/13 0231 08/22/13 0330  HGB 10.0* 10.1* 10.7*  HCT 33.2* 33.8* 36.5  WBC 9.1 10.2 10.3  PLT 348 332 284     CXR: 7/15 Pending  ASSESSMENT / PLAN:  Respiratory Arrest 7/15 ?aspiration vs plug on ATC Chronic ventilator dependent respiratory failure Chronic trach status - trach tube appears to be well positioned Persistent LLL airspace disease OHS/OSA PLAN:    Hold weaning and ATC collar today  Full vent support with increased MV based hypercarbic resp  failure  Repeat ABG   Cont vent bundle  Once able to wean again, likely in AM   - Goal of daytime ATC and mandatory nocturnal vent   - SLP eval for PMV/food if maintains wean, would have an improved swallow likely off vent   - Even if off 3 hr twice a day and repeat swallow ok , could consider feeding and intake adaquate  HTN, HLD Plan:   Cont Diltiazem, Metoprolol per home regimen  Labetalol PRN to keep SBP < 170  Hypokalemia Plan  Replace/recheck    severe dysphagia. Failed FEEs 7/9 Chronic G tube Morbid obesity Plan:    SUP: enteral famotidine  Hold TFs - calorie restricted for now  NPO   Mild chronic anemia Plan:   DVT px: SQ heparin  Monitor CBC intermittently  Transfuse per usual  ICU guidelines    Asymptomatic pyuria and bacteriuria  ?Asp event Plan:  Micro and abx as above - off all abx as of 7/11  Trend WBC and fevers  Hold ABX for now.   Trend PCT     Hypothyroidism DM II Chronic steroids, indication is not clear, Polymyalgia rheumatica on record Plan:    Cont SSI  Lantus to 25 BID 7/14  Added scheduled novolog per diabetic coordinator rec's. 7/14  Cont l-thyroxine  Taper dexamethasone as tolerated  Profound deconditioning Plan:    Clonazepam, Fentanyl patch per preadmission regimen  Oxycodone PRN  PT/OT consult   [name], ACNP [facility] Pager [phone] or [phone]  Global: Goal for family/patient is to go home and not to SNF. She has full nursing support at home (16hrs/day m-f and 12hr/day sat-sun). She also desires to eat, but failed FEES 7/9 and remains unable to wean to ATC for further SLP eval s/p respiratory arrest 7/15. Updated Husband.  CC time 40 min.  Patient seen and examined, agree with above note.  I dictated the care and orders written for this patient under my direction.  [name], MD [phone]

## 2013-08-23 NOTE — Procedures (Signed)
Cardiopulmonary Resuscitation  Patient started to desaturate then was noted to lost her heart beat.  ACLS was followed with CPR only and bagging for 45 seconds and patient's regained pulse and became hypertensive.  Will transfer to ICU, ABG obtained and vent adjusted.  Alyson ReedyWesam G. Jameriah Trotti, M.D. [facility]. Pager: [phone]. After hours pager: [phone].

## 2013-08-23 NOTE — Progress Notes (Signed)
RT Note: Patient coded this AM. Patient had been weaning on trach collar and had not been having any complications with that. When RT arrived to room patient was being ventilated via ambu bag by RN. Rt took over bagging and was met with no airway resistance. Patient then began breathing on her own and RT placed patient back on her vent. A large amount of thick yellow secretions were suctioned from patients trach. ABG obtained per MD order. Patient is currently maintaining well on vent and RT will continue to monitor.

## 2013-08-23 NOTE — Progress Notes (Signed)
Physical Therapy Discharge Patient Details Name: Shelly KohlerRuth M Kelley MRN: [id number] DOB: 03/31/1945 Today's Date: 08/23/2013 Time:  -     Patient discharged from PT services secondary to patient has refused 3 (three) consecutive times without medical reason. Pt reports she has done all the PT she is ever going to do and "doesn't need any PT."   Please see latest therapy progress note for current level of functioning and progress toward goals.    Progress and discharge plan discussed with patient and/or caregiver: Patient agrees with plan  GP     Revonda Menter Pager [phone]  08/23/2013, 10:49 AM

## 2013-08-23 NOTE — Progress Notes (Signed)
Speech Language Pathology Treatment:    Patient Details Name: Shelly KohlerRuth M Kelley MRN: [id number] DOB: 05/28/1945 Today's Date: 08/23/2013 Time: 4098-11910905-0935 SLP Time Calculation (min): 30 min  Assessment / Plan / Recommendation Clinical Impression  This note is for 0935 this morning.  PMSV donned for approximately 35 minutes following cuff deflation.  Speech 100% intelligible in conversation with SpO2 96-100%, RR 15, HR 78. No indications of air trapping behind valve when doffed and no reports of difficulty breathing.  PMSV valve worn throughout FEES assessment.  PT came to work with pt. and SLP requested PT remove valve prior to leaving room.  SLP notified RN of results of FEES and use of PMSV and that cuff should not need to be reinflated due to no indications of excess secretions during session (none audible or expectorated), all vitals stable and no respiratory distress reported from pt.     HPI HPI: [age] yoF former smoker, followed for asthma/ bronchitis, allergic rhinitis, Sleep apnea, OHS complicated by GERD, DM, polymyalgia rheumatica and CAD. Tracheostomy placed in NOvember 2014 after prolonged hospitalization for PNA and VDRF. Pt was  referred by Dr. Maple HudsonYoung for OP MBS due to exacerbation of trachel infection with green discharge, possibly related to food being found in trach stoma or during suction. Pt has been made NPO and feeding is restricted to PEG. MBS complete 7/7 with limited results due to patient body habitus. Subsequently seen in the trach clinic 7/7 and was noted to have a significant leak from the trach site. [facility], vent, and FEES to evaluate swallow. In-line PMSV evaluation also pending completion 7/10.    Pertinent Vitals WDL  SLP Plan  Continue with current plan of care    Recommendations Diet recommendations:  (see FEES report)      Patient may use Passy-Muir Speech Valve: Intermittently with supervision PMSV Supervision: Full        Oral Care Recommendations: Oral care BID Follow up Recommendations:  (TBD) Plan: Continue with current plan of care    GO     Breck CoonsLisa Willis Chantry Headen M.Ed [employer]-SLP Pager [phone]  08/23/2013

## 2013-08-23 NOTE — Progress Notes (Signed)
Report given to Fayrene FearingJames, Charity fundraiserN. Pt transferred to 2M16 on monitor and with Marcelino Freestoneana Boyd, RT. Pt hemodynamically stable and all belongings and medications with patient at time of transfer. 08/23/2013 1:22 PM Eileen Stanfordarter, Keonna Raether

## 2013-08-23 NOTE — Progress Notes (Signed)
Dear Doctor Molli KnockYacoub, This patient has been identified as a candidate for PICC for the following reason (s): poor veins/poor circulatory system (CHF, COPD, emphysema, diabetes, steroid use, IV drug abuse, etc.) If you agree, please write an order for the indicated device. For any questions contact the Vascular Access Team at [phone] if no answer, please leave a message.  Thank you for supporting the early vascular access assessment program.

## 2013-08-23 NOTE — Progress Notes (Signed)
Pt. Would only agree to allowing tech bath anterior part of body. Pt. Stated she could not breath laying down. Therefore, bathing the posterior of the body could not be done at this time. RN Notify.

## 2013-08-23 NOTE — Consult Note (Addendum)
WOC wound consult note Reason for Consult: Consult requested for buttocks and heel wounds.  Pt was admitted on 7/7 and all areas were noted to be present on admission. Pt health status has declined at this time and coded this AM.  [facility]. Pt with vent but can verbalize these wounds "have been there awhile." Multiple systemic factors may impair healing. Wound type: Sacrum with stage 2 wound in gluteal fold.  Appearance consistent with combined moisture and pressure.  2X.2X.1cm; 100% pink and moist.  Small amt yellow drainage, no odor.  Pt is frequently incontinent of stool and difficult to keep wounds to sacrum and buttock from becoming soiled.  Stool has become trapped under foam dressing. Left buttock with 2 stage 3 wounds located in close proximity; 3X3X.3cm; 40% yellow, 60% red, small amt yellow drainage, no odor.  Left upper buttock 1.2X1.2X.2cm; 60% red, 40% yellow, small amt yellow drainage, no odor.  Right heel with previously unstageable wound; dry scab peels off easily revealing a stage 2 wound; 2X2X.1cm, 90% pink and 10% dry scab remaining to wound edges.  No odor, small amt pink drainage. Left heel with intact skin. Pressure Ulcer POA: Yes Dressing procedure/placement/frequency: Pt on Sport low-airloss bed to reduce pressure.  Santyl ointment to chemically debride nonviable tissue to both left buttock wounds. Barrier cream to protect sacrum wound and repel moisture. Foam dressing to right heel to protect and promote healing.  Float heels to reduce pressure.  Please re-consult if further assistance is needed.  Thank-you,  Cammie Mcgeeawn Kikuye Korenek MSN, RN, CWOCN, [place]CN-AP, CNS [phone]

## 2013-08-23 NOTE — Procedures (Signed)
Objective Swallowing Evaluation: Fiberoptic Endoscopic Evaluation of Swallowing  Patient Details  Name: Shelly KohlerRuth M Higley MRN: [id number] Date of Birth: 01/19/1946  Today's Date: 08/23/2013 Time: 0900-0935 SLP Time Calculation (min): 35 min  Past Medical History:  Past Medical History  Diagnosis Date  . Fibromyalgia   . Osteoarthritis   . Asthma   . Type II or unspecified type diabetes mellitus without mention of complication, not stated as uncontrolled   . Esophageal reflux   . Allergic rhinitis, cause unspecified   . OSA on CPAP   . CAD (coronary artery disease)   . Other and unspecified hyperlipidemia    Past Surgical History:  Past Surgical History  Procedure Laterality Date  . Vesicovaginal fistula closure w/ tah    . Tonsillectomy    . Eye surgery    . Tracheostomy tube placement N/A 12/15/2012    Procedure: TRACHEOSTOMY;  Surgeon: Drema Halonhristopher E Newman, MD;  [facility];  Service: ENT;  Laterality: N/A;  . Tee without cardioversion N/A 01/20/2013    Procedure: TRANSESOPHAGEAL ECHOCARDIOGRAM (TEE);  Surgeon: Vesta MixerPhilip J Nahser, MD;  [facility];  Service: Cardiovascular;  Laterality: N/A;   HPI:  [age] yoF former smoker, followed for asthma/ bronchitis, allergic rhinitis, Sleep apnea, OHS complicated by GERD, DM, polymyalgia rheumatica and CAD. Tracheostomy placed in NOvember 2014 after prolonged hospitalization for PNA and VDRF. Pt was  referred by Dr. Maple HudsonYoung for OP MBS due to exacerbation of trachel infection with green discharge, possibly related to food being found in trach stoma or during suction. Pt has been made NPO and feeding is restricted to PEG. MBS complete 7/7 with limited results due to patient body habitus. Subsequently seen in the trach clinic 7/7 and was noted to have a significant leak from the trach site. [facility], vent, and FEES to evaluate swallow. In-line PMSV evaluation also pending completion 7/10.       Assessment / Plan / Recommendation Clinical Impression  Dysphagia Diagnosis: Mild oral phase dysphagia;Moderate pharyngeal phase dysphagia Clinical impression: Swallow function performed with pt. on ATC with PMSV in place resulting in improved pharyngeal pressures from FEES 08/17/13.  Pharyngeal phase impairments described as sensorimotor with delayed initiation and mildly decreased reduced tongue base ROM and laryngeal elevation leading to min-mild residuals.  Increased velocity and decreased laryngeal closure led to silent vocal cord penetration with thin cup sips.  Positioning not optimal for attempts at head postures.  Recommend during trach collar trials, pt. initiate Dys 2 diet textrue and nectar thick liquids, no straws, pills whole in applesauce, intermittent throat clear and full supervision.       Treatment Recommendation  Therapy as outlined in treatment plan below    Diet Recommendation Dysphagia 2 (Fine chop);Nectar-thick liquid   Liquid Administration via: Cup;No straw Medication Administration: Whole meds with puree Supervision: Patient able to self feed;Full supervision/cueing for compensatory strategies Compensations: Slow rate;Small sips/bites;Clear throat intermittently Postural Changes and/or Swallow Maneuvers: Seated upright 90 degrees;Upright 30-60 min after meal    Other  Recommendations Oral Care Recommendations: Oral care BID   Follow Up Recommendations   (TBD)    Frequency and Duration min 2x/week  2 weeks   Pertinent Vitals/Pain WDL          Reason for Referral Objectively evaluate swallowing function   Oral Phase Oral Preparation/Oral Phase Oral Phase: Impaired Oral - Solids Oral - Regular: Delayed oral transit (mild)   Pharyngeal Phase Pharyngeal Phase Pharyngeal  Phase: Impaired Pharyngeal - Honey Pharyngeal - Honey Teaspoon: Delayed swallow initiation;Premature spillage to valleculae;Pharyngeal residue - valleculae;Pharyngeal residue - pyriform  sinuses;Reduced laryngeal elevation;Reduced tongue base retraction Pharyngeal - Honey Cup: Delayed swallow initiation;Premature spillage to valleculae;Pharyngeal residue - valleculae;Pharyngeal residue - pyriform sinuses;Reduced tongue base retraction;Reduced laryngeal elevation Pharyngeal - Nectar Pharyngeal - Nectar Teaspoon: Delayed swallow initiation;Premature spillage to valleculae;Premature spillage to pyriform sinuses;Pharyngeal residue - valleculae;Pharyngeal residue - pyriform sinuses;Reduced laryngeal elevation;Reduced tongue base retraction Penetration/Aspiration details (nectar teaspoon): Material does not enter airway Pharyngeal - Nectar Cup: Delayed swallow initiation;Premature spillage to valleculae;Premature spillage to pyriform sinuses;Pharyngeal residue - valleculae;Pharyngeal residue - pyriform sinuses;Reduced laryngeal elevation;Reduced tongue base retraction Pharyngeal - Nectar Straw: Not tested Pharyngeal - Thin Pharyngeal - Ice Chips: Not tested Pharyngeal - Thin Teaspoon: Delayed swallow initiation;Premature spillage to pyriform sinuses;Pharyngeal residue - valleculae;Pharyngeal residue - pyriform sinuses;Reduced tongue base retraction;Reduced laryngeal elevation Pharyngeal - Thin Cup: Delayed swallow initiation;Premature spillage to valleculae;Premature spillage to pyriform sinuses;Pharyngeal residue - valleculae;Pharyngeal residue - pyriform sinuses;Reduced tongue base retraction;Reduced laryngeal elevation;Penetration/Aspiration during swallow Penetration/Aspiration details (thin cup): Material enters airway, CONTACTS cords and not ejected out (cues throat clear removed from cords) Pharyngeal - Thin Straw: Not tested Pharyngeal - Solids Pharyngeal - Puree: Delayed swallow initiation;Premature spillage to valleculae;Pharyngeal residue - pyriform sinuses;Reduced laryngeal elevation (min) Penetration/Aspiration details (puree): Material does not enter airway  Cervical  Esophageal Phase    GO    Cervical Esophageal Phase Cervical Esophageal Phase: [facility]         Darrow Bussing.Ed [employer] [phone]  08/23/2013

## 2013-08-23 NOTE — Progress Notes (Signed)
Writer called by centralized telemetry regarding decrease in oxygen status.  Pt found to be unresponsive and without a pulse. CPR initiated and performed for 45 seconds while being bagged via tracheostomy at 100% by Marcelino Freestoneana Boyd, RT. Pulse returned and spontaneous ventilation noted. Dr Cottie BandaYacoumb at bedside. Orders received for STAT ABG, portable chest xray, and transfer to 34M. Refer to flowsheets for additional details 08/23/2013 12:10 PM Shelly Stanfordarter, [name]

## 2013-08-24 ENCOUNTER — Inpatient Hospital Stay (HOSPITAL_COMMUNITY): Payer: Medicare Other

## 2013-08-24 LAB — CBC
HCT: 35 % — ABNORMAL LOW (ref 36.0–46.0)
HEMOGLOBIN: 10.4 g/dL — AB (ref 12.0–15.0)
MCH: 30.1 pg (ref 26.0–34.0)
MCHC: 29.7 g/dL — AB (ref 30.0–36.0)
MCV: 101.2 fL — ABNORMAL HIGH (ref 78.0–100.0)
Platelets: 267 10*3/uL (ref 150–400)
RBC: 3.46 MIL/uL — ABNORMAL LOW (ref 3.87–5.11)
RDW: 15.5 % (ref 11.5–15.5)
WBC: 11.2 10*3/uL — ABNORMAL HIGH (ref 4.0–10.5)

## 2013-08-24 LAB — BASIC METABOLIC PANEL
Anion gap: 12 (ref 5–15)
BUN: 30 mg/dL — ABNORMAL HIGH (ref 6–23)
CALCIUM: 9.1 mg/dL (ref 8.4–10.5)
CO2: 34 mEq/L — ABNORMAL HIGH (ref 19–32)
Chloride: 91 mEq/L — ABNORMAL LOW (ref 96–112)
Creatinine, Ser: 0.23 mg/dL — ABNORMAL LOW (ref 0.50–1.10)
GLUCOSE: 217 mg/dL — AB (ref 70–99)
Potassium: 3.6 mEq/L — ABNORMAL LOW (ref 3.7–5.3)
Sodium: 137 mEq/L (ref 137–147)

## 2013-08-24 LAB — GLUCOSE, CAPILLARY
GLUCOSE-CAPILLARY: 157 mg/dL — AB (ref 70–99)
GLUCOSE-CAPILLARY: 239 mg/dL — AB (ref 70–99)
Glucose-Capillary: 174 mg/dL — ABNORMAL HIGH (ref 70–99)
Glucose-Capillary: 186 mg/dL — ABNORMAL HIGH (ref 70–99)
Glucose-Capillary: 235 mg/dL — ABNORMAL HIGH (ref 70–99)
Glucose-Capillary: 253 mg/dL — ABNORMAL HIGH (ref 70–99)

## 2013-08-24 LAB — MAGNESIUM: MAGNESIUM: 2 mg/dL (ref 1.5–2.5)

## 2013-08-24 LAB — TROPONIN I

## 2013-08-24 LAB — PHOSPHORUS: Phosphorus: 3.3 mg/dL (ref 2.3–4.6)

## 2013-08-24 MED ORDER — INSULIN GLARGINE 100 UNIT/ML ~~LOC~~ SOLN
29.0000 [IU] | Freq: Two times a day (BID) | SUBCUTANEOUS | Status: DC
  Administered 2013-08-24 – 2013-08-26 (×4): 29 [IU] via SUBCUTANEOUS
  Filled 2013-08-24 (×5): qty 0.29

## 2013-08-24 MED ORDER — BIOTENE DRY MOUTH MT LIQD
15.0000 mL | Freq: Four times a day (QID) | OROMUCOSAL | Status: DC
  Administered 2013-08-24 – 2013-08-28 (×7): 15 mL via OROMUCOSAL

## 2013-08-24 MED ORDER — CHLORHEXIDINE GLUCONATE 0.12 % MT SOLN
15.0000 mL | Freq: Two times a day (BID) | OROMUCOSAL | Status: DC
  Administered 2013-08-24 – 2013-08-26 (×5): 15 mL via OROMUCOSAL
  Filled 2013-08-24 (×8): qty 15

## 2013-08-24 MED ORDER — INSULIN ASPART 100 UNIT/ML ~~LOC~~ SOLN
4.0000 [IU] | SUBCUTANEOUS | Status: DC
  Administered 2013-08-24 – 2013-08-29 (×30): 4 [IU] via SUBCUTANEOUS

## 2013-08-24 NOTE — Progress Notes (Signed)
Inpatient Diabetes Program Recommendations  AACE/ADA: New Consensus Statement on Inpatient Glycemic Control (2013)  Target Ranges:  Prepandial:   less than 140 mg/dL      Peak postprandial:   less than 180 mg/dL (1-2 hours)      Critically ill patients:  140 - 180 mg/dL   Results for Shelly KohlerLLRED, [name] (MRN [id number]) as of 08/24/2013 10:13  Ref. Range 08/23/2013 10:54 08/23/2013 13:59 08/23/2013 15:47 08/23/2013 19:44 08/23/2013 23:58 08/24/2013 03:41 08/24/2013 07:39  Glucose-Capillary Latest Range: 70-99 mg/dL 409174 (H) 811252 (H) 914245 (H) 211 (H) 239 (H) 157 (H) 174 (H)   Diabetes history: DM2  Outpatient Diabetes medications: Lantus 45 units BID  Current orders for Inpatient glycemic control: Lantus 25 units BID, Novolog 0-20 units Q4H  Inpatient Diabetes Program Recommendations Insulin - Basal: Please consider increasing Lantus to 29 units BID (based on 116 kg x 0.5 units). Insulin - Meal Coverage: Please consider ordering Novolog 4 units Q4H for tube feeding coverage (in addition to correction scale).  Thanks, Orlando PennerMarie Raelea Gosse, RN, MSN, CCRN Diabetes Coordinator Inpatient Diabetes Program [phone] (Team Pager) [phone] ([facility] office) [phone] [facility] office)

## 2013-08-24 NOTE — Trach Care Team (Signed)
Trach Care Progression Note   Patient Details Name: [name] MRN: [id number] DOB: 09/26/1945 Today's Date: 08/24/2013   Tracheostomy Assessment    Tracheostomy Portex 6 mm Cuffed (Active)  Status Secured 08/24/2013  1:36 PM  Site Assessment Clean;Dry 08/24/2013  1:36 PM  Site Care Cleansed;Dried;Dressing applied 08/24/2013  3:40 AM  Inner Cannula Care No inner cannula 08/24/2013  8:00 AM  Ties Assessment Secure 08/24/2013  1:36 PM  Cuff pressure (cm) 24 cm 08/24/2013  1:36 PM  Emergency Equipment at bedside Yes 08/24/2013  1:36 PM     Care Needs     Respiratory Therapy O2 Device: Ventilator FiO2 (%): 40 % SpO2: 100 % Plan - to place XL trach; TC during the day and vent at night    Speech Language Pathology  Reason Eval/Treat Not Completed: Medical issues which prohibited therapy (Patient back on vent after episode 7/15.Will f/u. ) Patient may use Passy-Muir Speech Valve:  (?? SLP will need to reassess) PMSV Supervision: Full Proceed with Objective Swallowing Evaluation:  (now NPO) Follow up Recommendations:  (TBD) SLP barriers to progression:  (difficulty tolerating TC)   Physical Therapy PT Recommendation/Assessment: Patient needs continued PT services Follow Up Recommendations: No PT follow up;Supervision/Assistance - 24 hour PT equipment: None recommended by PT    Occupational Therapy      Nutritional Patient's Current Diet: Tube feeding Tube Feeding: Vital High Protein Tube Feeding Frequency: Continuous Tube Feeding Strength: Full strength Diet Recommendations: Dysphagia 2 (Fine chop);Nectar-thick liquid    Case Management/Social Work Barriers to progression: Not tolerating weaning Anticipated Discharge Disposition: Back home with husband; previously had in home nurse      [facility]           [facility] Members -   MarathonZackary Kelley, SW, and [place] Kelley, RT       None            [name], [name] (scribe  for team) 08/24/2013, 2:40 PM

## 2013-08-24 NOTE — Progress Notes (Signed)
Name: [name] MRN: [id number] DOB: [birth year]    ADMISSION DATE:  08/15/2013 CONSULTATION DATE:  08/15/2013  [facility]ERRING MD :  [facility] PRIMARY SERVICE: PCCM  CHIEF COMPLAINT:  Tracheostomy air leak and food matter in the tracheostomy.  BRIEF PATIENT DESCRIPTION: 68 yo morbidly obese with tracheostomy placed in November 2014 after prolonged hospitalization for PNA and VDRF.  Seen in the trach clinic 7/7 and was noted to have a significant leak from the trach site. The patient also has been aspirating (she evidently passed a swallow evaluation and had been eating pta  SIGNIFICANT EVENTS / STUDIES:  FEES 7/9: failed w/ sig dysphagia. SLP rec: NPO 7/15: Respiratory arrest  LINES / TUBES: Trach (chronic) PEG (chronic)  CULTURES: Urine 7/7 >> E coli (pansens) Blood 7/7 >> NEG Resp 7/7 >> NEG  ANTIBIOTICS: Ceftriaxone 7/09 >7/11  Subjective: Patient feels well, wants to eat. Does not know what happened during arrest but reports she was not eating so she does not think she choked.  VITAL SIGNS: Temp:  [98.3 F (36.8 C)-98.9 F (37.2 C)] 98.9 F (37.2 C) (07/16 0832) Pulse Rate:  [5-86] 78 (07/16 0900) Resp:  [18-25] 21 (07/16 0900) BP: (89-155)/(41-85) 111/49 mmHg (07/16 0900) SpO2:  [94 %-100 %] 99 % (07/16 0900) FiO2 (%):  [40 %] 40 % (07/16 0849)  VENTILATOR SETTINGS: Vent Mode:  [-] PRVC FiO2 (%):  [40 %] 40 % Set Rate:  [20 bmp] 20 bmp Vt Set:  [450 mL] 450 mL PEEP:  [5 cmH20] 5 cmH20 Plateau Pressure:  [16 cmH20-22 cmH20] 18 cmH20  INTAKE / OUTPUT: Intake/Output     07/15 0701 - 07/16 0700 07/16 0701 - 07/17 0700   Other 180 20   NG/GT 446 60   Total Intake(mL/kg) 626 (5.4) 80 (0.7)   Urine (mL/kg/hr) 1045 (0.4) 230 (0.4)   Total Output 1045 230   Net -419 -150        Stool Occurrence 1 x      PHYSICAL EXAMINATION: General:  Obese, NAD, RASS 0, + F/C Neuro:  Diffusely weak, no focal deficits HEENT: WNL, trach site clean, voice weak, but able  to communicate on vent  Cardiovascular:  RRR s M Lungs: distant breath sounds, clear anteriorly, occ scattered rhonchi  Abdomen:  Obese, soft, +BS, G tube site clean Ext: minimal symmetric edema  LABS:  Recent Labs Lab 08/22/13 0330 08/23/13 1230 08/24/13 0032  NA 142 138 137  K 4.0 3.3* 3.6*  CL 95* 90* 91*  CO2 36* 36* 34*  BUN 24* 28* 30*  CREATININE 0.21* 0.21* 0.23*  GLUCOSE 165* 200* 217*    Recent Labs Lab 08/22/13 0330 08/23/13 1230 08/24/13 0032  HGB 10.7* 11.0* 10.4*  HCT 36.5 36.0 35.0*  WBC 10.3 13.9* 11.2*  PLT 284 268 267    CXR: Stable hypoaeration and pulmonary venous congestion.   ASSESSMENT / PLAN:  Respiratory Arrest 7/15 ?aspiration vs plug on ATC Chronic ventilator dependent respiratory failure Chronic trach status - trach tube appears to be well positioned Persistent LLL airspace disease OHS/OSA PLAN:    Hold weaning and ATC collar today  Full vent support    Cont vent bundle  Replace trach with extra-long on 7/17  Once able to wean again, likely in AM   - Goal of daytime ATC and mandatory nocturnal vent   - SLP eval for PMV/food if maintains wean, would have an improved swallow likely off vent   - Even  if off 3 hr twice a day and repeat swallow ok , could consider feeding and intake adaquate  HTN, HLD Plan:   Cont Diltiazem, Metoprolol per home regimen  Labetalol PRN to keep SBP < 170  Hypokalemia Plan  Replace/recheck    severe dysphagia. Failed FEEs 7/9 Chronic G tube Morbid obesity Plan:    SUP: enteral famotidine  NPO  TFs   Mild chronic anemia Plan:   DVT px: SQ heparin  Monitor CBC intermittently  Transfuse per usual ICU guidelines    Asymptomatic pyuria and bacteriuria  ?Asp event Plan:  Micro and abx as above - off all abx as of 7/11  Trend WBC and fevers  Hold ABX for now.   Trend PCT     Hypothyroidism DM II Chronic steroids, indication is not clear, Polymyalgia rheumatica on record Plan:     Cont SSI  Lantus to 29 BID   Added scheduled novolog per diabetic coord rec's  Cont l-thyroxine  Taper dexamethasone as tolerated  Profound deconditioning Plan:    Clonazepam, Fentanyl patch per preadmission regimen  Oxycodone PRN  PT/OT consult   Global: Goal for family/patient is to go home and not to SNF. She has full nursing support at home (16hrs/day m-f and 12hr/day sat-sun). She also desires to eat, but failed FEEDS 7/9 and remains unable to wean to ATC for further SLP eval s/p respiratory arrest 7/15.   Beverely LowElena Adamo, MD, MPH [facility] PGY-2  Current plan will be to replace with XL trach, then reattempt wean on ATC, unclear if resp arrest was due to tracheal obstruction or related to hypoventilation   Care during the described time interval was provided by me and/or other providers on the critical care team.  I have reviewed this patient's available data, including medical history, events of note, physical examination and test results as part of my evaluation  CC time x 5687m  Sekai Nayak V.  MD 08/24/2013 12:18 PM

## 2013-08-24 NOTE — Progress Notes (Signed)
NUTRITION FOLLOW UP  Intervention:    Continue 81M PEPuP Protocol with TF via PEG: Vital High Protein at 30 ml/h (720 ml/day) and Prostat 30 ml TID to provide 1020 kcals (57% of estimated nutrition needs, 19.5 kcals/kg ideal weight), 108 gm protein, 602 ml free water daily.  Diet clarification, NPO per verbal order Dr. Elsworth Soho.  Nutrition Dx:   Inadequate oral intake related to inability to eat as evidenced by NPO status, ongoing.  Goal:   Enteral nutrition to meet protein needs with calorie provision to promote weight loss. Met.  Monitor:   TF tolerance/adequacy, weight trend, labs, vent status.  Assessment:   Pt with tracheostomy placed in November 2014 after prolonged hospitalization for PNA and VDRF. Seen in the trach clinic 7/7 and was noted to have a significant leak from the trach site. The patient also has been aspirating (she evidently passed a swallow evaluation and has been eating). Pt on vent via trach. Has PEG which pt uses for 12 hours/day.   PTA patient was eating 3 meals per day, regular foods, but minimal intake. Received TF via PEG Glucerna 1.2 at 49ml/hr x 12 hours providing 1080 kcals, 54 gm protein, 725 ml free water daily.  Patient needs adequate protein to promote wound healing. Discussed patient in ICU rounds today. Diet was advanced to dysphagia 2 with nectar thick liquids 08-27-22. She coded 08-27-22, now back on the ventilator. Diet should be NPO, will clarify diet order.  Goal for nutrition is to maximize protein intake for healing while limiting calories to promote weight loss.   Patient is currently intubated on ventilator support MV: 8.8 L/min Temp (24hrs), Avg:98.6 F (37 C), Min:98.3 F (36.8 C), Max:98.9 F (37.2 C)   Height: Ht Readings from Last 1 Encounters:  08/16/13 $RemoveB'5\' 3"'[id number]$  (1.6 m)   Ideal Weight: 115 lb (52.3 kg)  Weight Status:   Wt Readings from Last 1 Encounters:  08/26/13 255 lb 11.7 oz (116 kg)  08/17/13  245 lb 9.5 oz (111.4 kg)  08/16/13   246 lb 4.1 oz (111.7 kg)   Weight is fluctuating with fluid status.  Body mass index is 45.31 kg/(m^2). class 3, extreme/morbid obesity   Re-estimated needs:  Kcal: 1799 Protein: >/= 105 gm Fluid: 1.8-2 L  Skin: unstageable pressure ulcer to right heel; stage 3 to left and right buttocks  Diet Order: Dysphagia 2 with nectar thick liquids, should be NPO, will change diet order   Intake/Output Summary (Last 24 hours) at 08/24/13 1031 Last data filed at 08/24/13 0900  Gross per 24 hour  Intake    706 ml  Output   1175 ml  Net   -469 ml    Last BM: 08/27/2022   Labs:   Recent Labs Lab 08/22/13 0330 08-26-2013 1230 08/24/13 0032  NA 142 138 137  K 4.0 3.3* 3.6*  CL 95* 90* 91*  CO2 36* 36* 34*  BUN 24* 28* 30*  CREATININE 0.21* 0.21* 0.23*  CALCIUM 9.5 9.3 9.1  MG  --   --  2.0  PHOS  --   --  3.3  GLUCOSE 165* 200* 217*    CBG (last 3)   Recent Labs  2013/08/26 2358 08/24/13 0341 08/24/13 0739  GLUCAP 239* 157* 174*    Scheduled Meds: . antiseptic oral rinse  15 mL Mouth Rinse QID  . chlorhexidine  15 mL Mouth Rinse BID  . collagenase   Topical Daily  . dexamethasone  1 mg Per Tube Daily  .  diltiazem  30 mg Per Tube 3 times per day  . dorzolamide  1 drop Right Eye BID  . famotidine  20 mg Per Tube QHS  . feeding supplement (PRO-STAT SUGAR FREE 64)  30 mL Per Tube TID  . fentaNYL  100 mcg Transdermal Q72H  . heparin subcutaneous  5,000 Units Subcutaneous 3 times per day  . insulin aspart  0-20 Units Subcutaneous 6 times per day  . insulin glargine  25 Units Subcutaneous BID  . ipratropium-albuterol  3 mL Nebulization Q4H  . latanoprost  1 drop Both Eyes QHS  . levothyroxine  50 mcg Per Tube QAC breakfast  . metoprolol tartrate  50 mg Per Tube BID  . oxyCODONE  5 mg Oral TID  . sertraline  25 mg Per Tube Daily    Continuous Infusions: . feeding supplement (VITAL HIGH PROTEIN) 1,000 mL (08/23/13 1608)     Molli Barrows, RD, LDN, [place] Pager  [phone] After Hours Pager [phone]

## 2013-08-24 NOTE — Progress Notes (Signed)
SLP Cancellation Note  Patient Details Name: Shelly KohlerRuth M Kelley MRN: [id number] DOB: 04/03/1945   Cancelled treatment:       Reason Eval/Treat Not Completed: Medical issues which prohibited therapy (Patient back on vent after episode 7/15.Will f/u. )  Ferdinand LangoLeah Shakeyla Giebler MA, CCC-SLP [phone]  [name] 08/24/2013, 7:41 AM

## 2013-08-25 ENCOUNTER — Inpatient Hospital Stay (HOSPITAL_COMMUNITY): Payer: Medicare Other

## 2013-08-25 LAB — CBC
HEMATOCRIT: 32.3 % — AB (ref 36.0–46.0)
HEMOGLOBIN: 9.9 g/dL — AB (ref 12.0–15.0)
MCH: 30.1 pg (ref 26.0–34.0)
MCHC: 30.7 g/dL (ref 30.0–36.0)
MCV: 98.2 fL (ref 78.0–100.0)
Platelets: 258 10*3/uL (ref 150–400)
RBC: 3.29 MIL/uL — ABNORMAL LOW (ref 3.87–5.11)
RDW: 15.4 % (ref 11.5–15.5)
WBC: 8.9 10*3/uL (ref 4.0–10.5)

## 2013-08-25 LAB — GLUCOSE, CAPILLARY
GLUCOSE-CAPILLARY: 175 mg/dL — AB (ref 70–99)
GLUCOSE-CAPILLARY: 246 mg/dL — AB (ref 70–99)
Glucose-Capillary: 180 mg/dL — ABNORMAL HIGH (ref 70–99)
Glucose-Capillary: 190 mg/dL — ABNORMAL HIGH (ref 70–99)

## 2013-08-25 LAB — BASIC METABOLIC PANEL
Anion gap: 11 (ref 5–15)
BUN: 21 mg/dL (ref 6–23)
CALCIUM: 9.2 mg/dL (ref 8.4–10.5)
CO2: 36 mEq/L — ABNORMAL HIGH (ref 19–32)
Chloride: 94 mEq/L — ABNORMAL LOW (ref 96–112)
Creatinine, Ser: <0.2 mg/dL — ABNORMAL LOW (ref 0.50–1.10)
GLUCOSE: 147 mg/dL — AB (ref 70–99)
POTASSIUM: 3.2 meq/L — AB (ref 3.7–5.3)
Sodium: 141 mEq/L (ref 137–147)

## 2013-08-25 MED ORDER — FENTANYL CITRATE 0.05 MG/ML IJ SOLN
INTRAMUSCULAR | Status: AC
  Administered 2013-08-25: 100 ug
  Filled 2013-08-25: qty 2

## 2013-08-25 MED ORDER — ETOMIDATE 2 MG/ML IV SOLN
INTRAVENOUS | Status: AC
  Administered 2013-08-25: 20 mg
  Filled 2013-08-25: qty 10

## 2013-08-25 NOTE — Procedures (Signed)
PT was placed in the supine position. Pre-medicated w/ 100% FIO2 after being placed on full vent support. Following this 100 mcg fentanyl and 20 mg of etomidate. IV. Administered. Then # 6, cuffed,  PROXIMAL XLT trach was placed instead of prior #6. PT tolerated well w/ color change on capno and getting good volumes on vent.  Plan Will resume PSV after she wakes up from sedation   Anders SimmondsPete Lianny Molter ACNP-BC [facility] Pager # [phone] OR # [phone] if no answer

## 2013-08-25 NOTE — Progress Notes (Signed)
Patient refused mouth care and repositioning adamantly the entire day shift. I encouraged and educated the patient on the risks of infection and worsening pressure ulcers several times throughout the day in an effort to convince her of compliance. When asked about bathing today, she replied that she refuses until she can get oral food. I provided her ice chips as are available to her, and she refused this, saying it makes her mouth burn. She says she is determined to eat and says she will go home to eat. She asks that I stress her demand to orally eat to the MD. I explained how her prior aspiration is the cause of her current diet. She explained she was lying down when she ate, and should have been sitting up straight.   Zenovia JordanBridget Vick Filter, RN

## 2013-08-25 NOTE — Progress Notes (Signed)
Name: [name] MRN: [id number] DOB: 01/22/1946    ADMISSION DATE:  08/15/2013 CONSULTATION DATE:  08/15/2013  [facility] MD :  [facility] PRIMARY SERVICE: PCCM  CHIEF COMPLAINT:  Tracheostomy air leak and food matter in the tracheostomy.  BRIEF PATIENT DESCRIPTION: 68 yo morbidly obese with tracheostomy placed in November 2014 after prolonged hospitalization for PNA and VDRF.  Seen in the trach clinic 7/7 and was noted to have a significant leak from the trach site. The patient also has been aspirating (she evidently passed a swallow evaluation and had been eating pta  SIGNIFICANT EVENTS / STUDIES:  FEES 7/9: failed w/ sig dysphagia. SLP rec: NPO 7/15: Respiratory arrest 7/17: Change to extra-long trach  LINES / TUBES: Trach (chronic) PEG (chronic)  CULTURES: Urine 7/7 >> E coli (pansens) Blood 7/7 >> NEG Resp 7/7 >> NEG  ANTIBIOTICS: Ceftriaxone 7/09 >7/11  Subjective: Patient complains of persistent dizziness and thirst.  VITAL SIGNS: Temp:  [98.2 F (36.8 C)-99.4 F (37.4 C)] 99.4 F (37.4 C) (07/17 0840) Pulse Rate:  [71-88] 88 (07/17 1000) Resp:  [16-27] 17 (07/17 1000) BP: (109-161)/(44-96) 126/49 mmHg (07/17 1000) SpO2:  [94 %-100 %] 99 % (07/17 1000) FiO2 (%):  [40 %] 40 % (07/17 0852)  VENTILATOR SETTINGS: Vent Mode:  [-] CPAP;PSV FiO2 (%):  [40 %] 40 % Set Rate:  [20 bmp] 20 bmp Vt Set:  [450 mL] 450 mL PEEP:  [5 cmH20] 5 cmH20 Pressure Support:  [10 cmH20] 10 cmH20 Plateau Pressure:  [16 cmH20-19 cmH20] 16 cmH20  INTAKE / OUTPUT: Intake/Output     07/16 0701 - 07/17 0700 07/17 0701 - 07/18 0700   Other 240 120   NG/GT 720 90   Total Intake(mL/kg) 960 (8.3) 210 (1.8)   Urine (mL/kg/hr) 1270 (0.5) 200 (0.5)   Total Output 1270 200   Net -310 +10          PHYSICAL EXAMINATION: General:  Obese, NAD, RASS 0, + F/C Neuro:  Diffusely weak, no focal deficits HEENT: WNL, trach site clean, voice weak, but able to communicate on vent    Cardiovascular:  RRR s M Lungs: distant breath sounds, clear anteriorly, occ scattered rhonchi  Abdomen:  Obese, soft, +BS, G tube site clean Ext: minimal symmetric edema  LABS:  Recent Labs Lab 08/23/13 1230 08/24/13 0032 08/25/13 0750  NA 138 137 141  K 3.3* 3.6* 3.2*  CL 90* 91* 94*  CO2 36* 34* 36*  BUN 28* 30* 21  CREATININE 0.21* 0.23* <0.20*  GLUCOSE 200* 217* 147*    Recent Labs Lab 08/23/13 1230 08/24/13 0032 08/25/13 0750  HGB 11.0* 10.4* 9.9*  HCT 36.0 35.0* 32.3*  WBC 13.9* 11.2* 8.9  PLT 268 267 258    CXR: Stable hypoaeration and pulmonary venous congestion.   ASSESSMENT / PLAN:  Respiratory Arrest 7/15 ?aspiration vs plug on ATC Chronic ventilator dependent respiratory failure Chronic trach status - trach tube appears to be well positioned Persistent LLL airspace disease OHS/OSA PLAN:    Switch trach to extra-long to prevent possible airway obstruction  Weaning and ATC collar today   HTN, HLD Plan:   Cont Diltiazem, Metoprolol per home regimen  Labetalol PRN to keep SBP < 170  Hypokalemia Plan  Replace/recheck    Severe dysphagia. Failed FEEs 7/9 Chronic G tube Morbid obesity Plan:    SUP: enteral famotidine  TFs  Will give ice chips for comfort today   Mild chronic anemia Plan:  DVT px: SQ heparin  Monitor CBC intermittently  Transfuse per usual ICU guidelines    Asymptomatic pyuria and bacteriuria  ?Asp event Plan:  Micro and abx as above - off all abx as of 7/11  Trend WBC and fevers  Hold ABX for now.     Hypothyroidism DM II Chronic steroids, indication is not clear, Polymyalgia rheumatica on record Plan:    Cont SSI  Lantus 29 BID   Scheduled novolog per diabetic coord rec's  Cont l-thyroxine  Taper dexamethasone as tolerated  Profound deconditioning Plan:    Clonazepam, Fentanyl patch per preadmission regimen  Oxycodone PRN  PT/OT consult   Global: Goal for family/patient is to go home and not to  SNF. She has full nursing support at home (16hrs/day m-f and 12hr/day sat-sun). She also desires to eat, but failed FEEDS 7/9 and remains unable to wean to ATC for further SLP eval s/p respiratory arrest 7/15.   Beverely LowElena Adamo, MD, MPH [facility] PGY-2  Current plan will be to replace with XL trach, then reattempt wean on ATC, unclear if resp arrest was due to tracheal obstruction or related to hypoventilation   Care during the described time interval was provided by me and/or other providers on the critical care team.  I have reviewed this patient's available data, including medical history, events of note, physical examination and test results as part of my evaluation  CC time x 4743m  Oretha MilchALVA,Katrena Stehlin V.  MD

## 2013-08-26 DIAGNOSIS — E662 Morbid (severe) obesity with alveolar hypoventilation: Secondary | ICD-10-CM

## 2013-08-26 LAB — GLUCOSE, CAPILLARY
GLUCOSE-CAPILLARY: 213 mg/dL — AB (ref 70–99)
Glucose-Capillary: 174 mg/dL — ABNORMAL HIGH (ref 70–99)
Glucose-Capillary: 155 mg/dL — ABNORMAL HIGH (ref 70–99)
Glucose-Capillary: 186 mg/dL — ABNORMAL HIGH (ref 70–99)
Glucose-Capillary: 222 mg/dL — ABNORMAL HIGH (ref 70–99)
Glucose-Capillary: 192 mg/dL — ABNORMAL HIGH (ref 70–99)

## 2013-08-26 MED ORDER — IPRATROPIUM-ALBUTEROL 0.5-2.5 (3) MG/3ML IN SOLN
3.0000 mL | Freq: Four times a day (QID) | RESPIRATORY_TRACT | Status: DC
  Administered 2013-08-26 – 2013-08-29 (×13): 3 mL via RESPIRATORY_TRACT
  Filled 2013-08-26 (×12): qty 3

## 2013-08-26 MED ORDER — DEXAMETHASONE 1 MG/ML PO CONC
1.0000 mg | ORAL | Status: DC
  Administered 2013-08-28: 1 mg
  Filled 2013-08-26: qty 1

## 2013-08-26 MED ORDER — MECLIZINE HCL 25 MG PO TABS
25.0000 mg | ORAL_TABLET | Freq: Three times a day (TID) | ORAL | Status: DC | PRN
  Administered 2013-08-27: 25 mg
  Filled 2013-08-26 (×2): qty 1

## 2013-08-26 MED ORDER — CLONAZEPAM 0.5 MG PO TABS
0.5000 mg | ORAL_TABLET | Freq: Two times a day (BID) | ORAL | Status: DC | PRN
  Administered 2013-08-27 – 2013-08-29 (×4): 0.5 mg
  Filled 2013-08-26 (×4): qty 1

## 2013-08-26 MED ORDER — INSULIN GLARGINE 100 UNIT/ML ~~LOC~~ SOLN
30.0000 [IU] | Freq: Two times a day (BID) | SUBCUTANEOUS | Status: DC
  Administered 2013-08-26 – 2013-08-29 (×6): 30 [IU] via SUBCUTANEOUS
  Filled 2013-08-26 (×7): qty 0.3

## 2013-08-26 NOTE — Progress Notes (Addendum)
Patient has refused mouth care, foley care, bathing, trach care and Q2 turns during my shift.  I explained to the patient the implications of refusing mouth care and not turning can lead to further infection.  Patient currently has multiple wound sites on her backside, but I was not able to view them due to her refusal.  Patient stated she understood, but does not want to comply.  On multiple attempts throughout the evening shift, patient refused all the items listed previously.  Patient states a desire to eat and does not understand why she is not able to eat.  I explained to the patient that due to her choking and PEA previously, that she is not able to eat at this time per doctor order. Lynita LombardJennifer D. Ephriam Knuckleslifton, RN

## 2013-08-26 NOTE — Progress Notes (Signed)
Name: Shelly KohlerRuth M Kelley MRN: [id number] DOB: 09/11/1945    ADMISSION DATE:  08/15/2013 CONSULTATION DATE:  08/15/2013  [facility] :  [facility] PRIMARY SERVICE: PCCM  CHIEF COMPLAINT:  Tracheostomy air leak and food matter in the tracheostomy.  BRIEF PATIENT DESCRIPTION: 68 yo morbidly obese with tracheostomy placed in November 2014 after prolonged hospitalization for PNA and VDRF.  Seen in the trach clinic 7/7 and was noted to have a significant leak from the trach site. The patient also has been aspirating (she evidently passed a swallow evaluation and had been eating pta  SIGNIFICANT EVENTS / STUDIES:  FEES 7/9: failed w/ sig dysphagia. SLP rec: NPO 7/15: Respiratory arrest 7/17: Change to extra-long trach  LINES / TUBES: Trach (chronic) PEG (chronic)  CULTURES: Urine 7/7 >> E coli (pansens) Blood 7/7 >> NEG Resp 7/7 >> NEG  ANTIBIOTICS: Ceftriaxone 7/09 >7/11  Subjective: No new complaints  VITAL SIGNS: Temp:  [98.5 F (36.9 C)-99 F (37.2 C)] 98.9 F (37.2 C) (07/18 1623) Pulse Rate:  [60-89] 66 (07/18 1600) Resp:  [12-26] 23 (07/18 1600) BP: (110-146)/(44-67) 117/61 mmHg (07/18 1600) SpO2:  [85 %-100 %] 99 % (07/18 1600) FiO2 (%):  [30 %-40 %] 30 % (07/18 1515)  VENTILATOR SETTINGS: Vent Mode:  [-] PRVC FiO2 (%):  [30 %-40 %] 30 % Set Rate:  [16 bmp-20 bmp] 16 bmp Vt Set:  [450 mL] 450 mL PEEP:  [5 cmH20] 5 cmH20 Plateau Pressure:  [19 cmH20-26 cmH20] 19 cmH20  INTAKE / OUTPUT: Intake/Output     07/17 0701 - 07/18 0700 07/18 0701 - 07/19 0700   Other 420 175   NG/GT 690 210   Total Intake(mL/kg) 1110 (9.6) 385 (3.3)   Urine (mL/kg/hr) 1150 (0.4) 475 (0.4)   Total Output 1150 475   Net -40 -90          PHYSICAL EXAMINATION: General:  Obese, NAD Neuro:  Diffusely weak, no focal deficits HEENT: WNL, trach site clean, voice weak, but able to communicate on vent  Cardiovascular:  RRR s M Lungs: distant breath sounds, clear anteriorly, occ  scattered rhonchi  Abdomen:  Obese, soft, +BS, G tube site clean Ext: minimal symmetric edema  LABS:  I have reviewed all of today's lab results. Relevant abnormalities are discussed in the A/P section  ASSESSMENT / PLAN:  Respiratory Arrest 7/15 ?aspiration vs plug on ATC Chronic ventilator dependent respiratory failure Chronic trach status - trach tube appears to be well positioned Persistent LLL airspace disease OHS/OSA PLAN:    Cont PSV > ATC weaning as tolerated  HTN, HLD Plan:   Cont Diltiazem, Metoprolol per home regimen  Cont labetalol PRN to keep SBP < 170  Hypokalemia Plan  Replace/recheck as needed   Severe dysphagia. Failed FEEs 7/9 Chronic G tube Morbid obesity Plan:    SUP: enteral famotidine  Cont TFs  NPO except ice chips   Mild chronic anemia Plan:   DVT px: SQ heparin  Monitor CBC intermittently  Transfuse per usual ICU guidelines    Asymptomatic pyuria and bacteriuria  ?Asp event Plan:  Micro and abx as above - off all abx as of 7/11  Trend WBC and fevers  Hold ABX for now.     Hypothyroidism DM II Chronic steroids, indication is not clear, Polymyalgia rheumatica on record Plan:    Cont SSI  Cont lantus   Scheduled novolog per diabetic coord rec's  Cont l-thyroxine  Cont to taper dexamethasone as tolerated  Profound deconditioning Plan:    Clonazepam, Fentanyl patch per preadmission regimen  Oxycodone PRN  PT/OT consult   Global: Goal for family/patient is to go home and not to SNF. She has full nursing support at home (16hrs/day m-f and 12hr/day sat-sun). She also desires to eat, but failed FEEDS 7/9 and remains unable to wean to ATC for further SLP eval s/p respiratory arrest 7/15.  Billy Fischer, MD ; [facility] [phone].  After 5:30 PM or weekends, call [phone]

## 2013-08-27 LAB — GLUCOSE, CAPILLARY
GLUCOSE-CAPILLARY: 151 mg/dL — AB (ref 70–99)
GLUCOSE-CAPILLARY: 171 mg/dL — AB (ref 70–99)
GLUCOSE-CAPILLARY: 220 mg/dL — AB (ref 70–99)
Glucose-Capillary: 138 mg/dL — ABNORMAL HIGH (ref 70–99)
Glucose-Capillary: 133 mg/dL — ABNORMAL HIGH (ref 70–99)
Glucose-Capillary: 132 mg/dL — ABNORMAL HIGH (ref 70–99)

## 2013-08-27 NOTE — Evaluation (Addendum)
Physical Therapy Evaluation Patient Details Name: Shelly KohlerRuth M Kelley MRN: [id number] DOB: 02/20/1945 Today's Date: 08/27/2013   History of Present Illness  Pt is a 68 y/o female who is a chronic trach patient, initially placed November 2014. Pt presents with a trach leak and suspician for aspiration pneumonia, as there were food particles in the trach.   Initial PT eval 08/18/13.  Discontinued PT 08/23/13 due to patient refusal of service.  New PT consult orders - patient declining SNF, wants to return home.  Clinical Impression   Patient presents with problems listed below.  Will benefit from acute PT to maximize bed mobility prior to discharge home.  Patient was willing to participate with PT today (had refused PT and d/c services on 08/23/13).  Recommend PRAFO boots for heel protection.   Patient declining SNF, wants to return home.  Per chart, patient will have nursing services 16 hours/day M-F and 12 hours/day Sat/Sun.  Husband works - is home at night.  They have hospital bed, hoyer lift and w/c at home per husband.      Follow Up Recommendations No PT follow up;Supervision/Assistance - 24 hour (Patient to have Nursing assist at home 16 hrs/day)    Equipment Recommendations  None recommended by PT    Recommendations for Other Services       Precautions / Restrictions Precautions Precautions: Fall Restrictions Weight Bearing Restrictions: No      Mobility  Bed Mobility Overal bed mobility: Needs Assistance;+2 for physical assistance Bed Mobility: Rolling;Sidelying to Sit;Sit to Supine Rolling: Max assist;+2 for physical assistance Sidelying to sit: Total assist;+2 for physical assistance   Sit to supine: Total assist;+2 for physical assistance   General bed mobility comments: VC's for sequencing and technique. Hand-over-hand assist to reach for bed rails. Significant assist to achieve full sitting on EOB. Once sitting, total assist for balance due to posterior lean. Patient  tolerated sitting EOB x 5 minutes.  Total assist to return to supine.  Repositioned patient onto Rt side.  Patient declined pillow under LE's for heel pressure relief.  [facility].  Transfers                    Ambulation/Gait                Stairs            Wheelchair Mobility    Modified Rankin (Stroke Patients Only)       Balance Overall balance assessment: Needs assistance Sitting-balance support: Bilateral upper extremity supported;Feet unsupported Sitting balance-Leahy Scale: Zero Sitting balance - Comments: Worked in sitting x5 min on shifting weight forward over hips.  Postural control: Posterior lean                                   Pertinent Vitals/Pain     Home Living Family/patient expects to be discharged to:: Private residence Living Arrangements: Spouse/significant other Available Help at Discharge: Family;Personal care attendant (Nursing available 16 hours/day M-F and 12 hours/day Sat/Sun) Type of Home: House Home Access: Ramped entrance       Home Equipment: Hospital bed;Wheelchair - manual Secondary school teacher(Hoyer lift) Additional Comments: Husband works; will have "full nursing care" per chart    Prior Function Level of Independence: Needs assistance   Gait / Transfers Assistance Needed: Husband states she has not walked or transferred herselt in 9 months.  ADL's / Homemaking Assistance Needed:  Assist for all ADL's - completed in hospital bed        Hand Dominance   Dominant Hand: Right    Extremity/Trunk Assessment   Upper Extremity Assessment: Generalized weakness           Lower Extremity Assessment: RLE deficits/detail;LLE deficits/detail RLE Deficits / Details: Strength grossly 2/5; Decreased ankle DF ROM, wound on heel LLE Deficits / Details: Strength grossly 2/5; Decreased ankle DF ROM  Cervical / Trunk Assessment: Kyphotic (Weak)  Communication   Communication: Tracheostomy  Cognition  Arousal/Alertness: Awake/alert Behavior During Therapy: Anxious Overall Cognitive Status: Difficult to assess                      General Comments      Exercises        Assessment/Plan    PT Assessment Patient needs continued PT services  PT Diagnosis Generalized weakness   PT Problem List Decreased strength;Decreased range of motion;Decreased activity tolerance;Decreased balance;Decreased mobility;Cardiopulmonary status limiting activity;Obesity  PT Treatment Interventions Functional mobility training;Therapeutic activities;Therapeutic exercise;Balance training;Patient/family education   PT Goals (Current goals can be found in the Care Plan section) Acute Rehab PT Goals Patient Stated Goal: None stated PT Goal Formulation: With patient Time For Goal Achievement: 09/03/13 Potential to Achieve Goals: Fair    Frequency Min 2X/week   Barriers to discharge        Co-evaluation               End of Session Equipment Utilized During Treatment: Oxygen Activity Tolerance: Patient limited by fatigue Patient left: in bed;with call bell/phone within reach Nurse Communication: Mobility status;Need for lift equipment (PRAFO boots)         Time: 4098-1191 PT Time Calculation (min): 21 min   Charges:   PT Evaluation $PT Re-evaluation: 1 Procedure PT Treatments $Therapeutic Activity: 8-22 mins   PT G Codes:          Vena Austria 08/27/2013, 8:48 PM Durenda Hurt. Renaldo Fiddler, [facility] Acute Rehab Services Pager [phone]

## 2013-08-27 NOTE — Progress Notes (Signed)
Name: [name] MRN: [id number] DOB: January 19, 1946    ADMISSION DATE:  08/15/2013 CONSULTATION DATE:  08/15/2013  REFERRING MD :  [facility] PRIMARY SERVICE: PCCM  CHIEF COMPLAINT:  Tracheostomy air leak and food matter in the tracheostomy.  BRIEF PATIENT DESCRIPTION: 68 yo morbidly obese with tracheostomy placed in November 2014 after prolonged hospitalization for PNA and VDRF.  Seen in the trach clinic 7/7 and was noted to have a significant leak from the trach site. The patient also has been aspirating (she evidently passed a swallow evaluation and had been eating pta  SIGNIFICANT EVENTS / STUDIES:  FEES 7/9: failed w/ sig dysphagia. SLP rec: NPO 7/15: Respiratory arrest 7/17: Change to extra-long trach 7/19 Tolerating PSV of 5 cm H2) and short stints on ATC.   LINES / TUBES: Trach (chronic) PEG (chronic)  CULTURES: Urine 7/7 >> E coli (pansens) Blood 7/7 >> NEG Resp 7/7 >> NEG  ANTIBIOTICS: Ceftriaxone 7/09 >7/11  Subjective: No new complaints  VITAL SIGNS: Temp:  [98.2 F (36.8 C)-99.1 F (37.3 C)] 99.1 F (37.3 C) (07/19 1200) Pulse Rate:  [56-85] 85 (07/19 1300) Resp:  [14-31] 17 (07/19 1300) BP: (86-137)/(36-70) 120/56 mmHg (07/19 1300) SpO2:  [95 %-100 %] 95 % (07/19 1300) FiO2 (%):  [30 %-35 %] 35 % (07/19 1236) Weight:  [106.3 kg (234 lb 5.6 oz)] 106.3 kg (234 lb 5.6 oz) (07/19 0600)  VENTILATOR SETTINGS: Vent Mode:  [-] PSV;CPAP FiO2 (%):  [30 %-35 %] 35 % Set Rate:  [16 bmp] 16 bmp Vt Set:  [450 mL] 450 mL PEEP:  [5 cmH20] 5 cmH20 Pressure Support:  [5 cmH20] 5 cmH20 Plateau Pressure:  [18 cmH20-19 cmH20] 19 cmH20  INTAKE / OUTPUT: Intake/Output     07/18 0701 - 07/19 0700 07/19 0701 - 07/20 0700   Other 585 150   NG/GT 720 180   Total Intake(mL/kg) 1305 (12.3) 330 (3.1)   Urine (mL/kg/hr) 1050 (0.4) 255 (0.3)   Total Output 1050 255   Net +255 +75        Stool Occurrence 1 x      PHYSICAL EXAMINATION: General:  Obese,  NAD Neuro:  Diffusely weak, no focal deficits HEENT: WNL, trach site clean, voice weak, but able to communicate on vent  Cardiovascular:  RRR s M Lungs: Clear anteriorly Abdomen:  Obese, soft, +BS, G tube site clean Ext: minimal symmetric edema  LABS:  I have reviewed all of today's lab results. Relevant abnormalities are discussed in the A/P section  ASSESSMENT / PLAN: Chronic ventilator dependent respiratory failure Chronic trach status  Recurrent trach tube malpositioning - trach changed 7/17 to XLT OHS/OSA Respiratory Arrest 7/15 - aspiration vs plug on ATC PLAN:    Cont PSV > ATC weaning as tolerated  HTN, HLD Plan:   Cont Diltiazem, Metoprolol per home regimen  Cont labetalol PRN to keep SBP < 170  Hypokalemia Plan  Monitor BMET intermittently  Monitor I/Os  Correct electrolytes as indicated   Severe dysphagia. Failed FEEs 7/9 Chronic G tube Morbid obesity Plan:    SUP: enteral famotidine  Cont TFs  NPO except ice chips   Mild chronic anemia Plan:   DVT px: SQ heparin  Monitor CBC intermittently  Transfuse per usual ICU guidelines    Asymptomatic pyuria and bacteriuria  ?Asp event Plan:  Micro and abx as above - off all abx as of 7/11  Trend WBC and fevers  Hold ABX for now.  Hypothyroidism DM II Chronic steroids, indication is not clear, Polymyalgia rheumatica on record Plan:    Cont SSI  Cont lantus   Scheduled novolog per diabetic coord rec's  Cont l-thyroxine  Cont to taper dexamethasone as tolerated  Profound deconditioning Plan:    Clonazepam, Fentanyl patch per preadmission regimen  Oxycodone PRN  PT/OT consult   Global: Anticipate DC to home in next day or two  Billy Fischeravid Kasir Hallenbeck, MD ; [facility] Mobile [phone].  After 5:30 PM or weekends, call [phone]

## 2013-08-27 NOTE — Progress Notes (Signed)
Pt states that @ home uses Ventilator during the day. Does not use ATC. Repeated desire to eat.

## 2013-08-27 NOTE — Progress Notes (Signed)
Patient refused the following services throughout day shift, though asked multiple times: wound care, bath, foley care, turning, prevalon boots, raising heels off the bed, and oral care. She was educated on the increased likeliness of infection and continued deterioration of condition from refusing treatment. She allowed services from physical therapy to sit-up and dangle off the bed today and allowed me to complete trach care.  Zenovia JordanBridget Neola Worrall, RN

## 2013-08-28 DIAGNOSIS — N3000 Acute cystitis without hematuria: Secondary | ICD-10-CM

## 2013-08-28 LAB — URINALYSIS, ROUTINE W REFLEX MICROSCOPIC
Bilirubin Urine: NEGATIVE
GLUCOSE, UA: NEGATIVE mg/dL
KETONES UR: NEGATIVE mg/dL
Nitrite: POSITIVE — AB
PH: 5 (ref 5.0–8.0)
PROTEIN: NEGATIVE mg/dL
Specific Gravity, Urine: 1.02 (ref 1.005–1.030)
Urobilinogen, UA: 1 mg/dL (ref 0.0–1.0)

## 2013-08-28 LAB — GLUCOSE, CAPILLARY
GLUCOSE-CAPILLARY: 147 mg/dL — AB (ref 70–99)
GLUCOSE-CAPILLARY: 181 mg/dL — AB (ref 70–99)
GLUCOSE-CAPILLARY: 289 mg/dL — AB (ref 70–99)
Glucose-Capillary: 167 mg/dL — ABNORMAL HIGH (ref 70–99)
Glucose-Capillary: 180 mg/dL — ABNORMAL HIGH (ref 70–99)
Glucose-Capillary: 234 mg/dL — ABNORMAL HIGH (ref 70–99)
Glucose-Capillary: 243 mg/dL — ABNORMAL HIGH (ref 70–99)
Glucose-Capillary: 282 mg/dL — ABNORMAL HIGH (ref 70–99)

## 2013-08-28 LAB — URINE MICROSCOPIC-ADD ON

## 2013-08-28 LAB — BASIC METABOLIC PANEL
Anion gap: 13 (ref 5–15)
BUN: 27 mg/dL — ABNORMAL HIGH (ref 6–23)
CALCIUM: 9.1 mg/dL (ref 8.4–10.5)
CO2: 31 mEq/L (ref 19–32)
CREATININE: 0.24 mg/dL — AB (ref 0.50–1.10)
Chloride: 96 mEq/L (ref 96–112)
GFR calc Af Amer: >90 mL/min (ref >90)
GLUCOSE: 264 mg/dL — AB (ref 70–99)
Potassium: 3.5 mEq/L — ABNORMAL LOW (ref 3.7–5.3)
Sodium: 140 mEq/L (ref 137–147)

## 2013-08-28 MED ORDER — STARCH (THICKENING) PO POWD
ORAL | Status: DC | PRN
  Filled 2013-08-28: qty 227

## 2013-08-28 NOTE — Progress Notes (Signed)
Patient has refused mouth care, bathing, trach care and Q2 turns during my shift. I explained to the patient the implications of refusing mouth care and not turning can lead to further infection. Patient currently has multiple wound sites on her backside, but I was not able to view them due to her refusal. Patient stated she understood, but does not want to comply. On multiple attempts throughout the evening shift, patient refused all the items listed previously. Patient states a desire to eat and does not understand why she is not able to eat. I explained to the patient that due to her choking and PEA previously, that she is not able to eat at this time per doctor order. Willia CrazeLindsay Thaison Kolodziejski, RN

## 2013-08-28 NOTE — Progress Notes (Signed)
Name: Shelly KohlerRuth M Kelley MRN: [id number] DOB: 08/14/1945    ADMISSION DATE:  08/15/2013 CONSULTATION DATE:  08/15/2013  REFERRING MD :  [name] PRIMARY SERVICE: PCCM  CHIEF COMPLAINT:  Tracheostomy air leak and food matter in the tracheostomy.  BRIEF PATIENT DESCRIPTION: 68 yo morbidly obese with tracheostomy placed in November 2014 after prolonged hospitalization for PNA and VDRF.  Seen in the trach clinic 7/7 and was noted to have a significant leak from the trach site. The patient also has been aspirating (she evidently passed a swallow evaluation and had been eating pta  SIGNIFICANT EVENTS / STUDIES:  FEES 7/9: failed w/ sig dysphagia. SLP rec: NPO 7/15: Respiratory arrest 7/17: Change to extra-long trach 7/19 Tolerating PSV of 5 cm H2) and short stints on ATC.   LINES / TUBES: Trach (chronic) PEG (chronic)  CULTURES: Urine 7/7 >> E coli (pansens) Blood 7/7 >> NEG Resp 7/7 >> NEG  ANTIBIOTICS: Ceftriaxone 7/09 >7/11  Subjective: No new complaints  VITAL SIGNS: Temp:  [98 F (36.7 C)-99.1 F (37.3 C)] 98 F (36.7 C) (07/20 0831) Pulse Rate:  [58-85] 77 (07/20 0947) Resp:  [1-24] 1 (07/20 0947) BP: (106-141)/(47-70) 116/51 mmHg (07/20 0600) SpO2:  [93 %-100 %] 97 % (07/20 0947) FiO2 (%):  [30 %-35 %] 35 % (07/20 0947)  VENTILATOR SETTINGS: Vent Mode:  [-] PRVC FiO2 (%):  [30 %-35 %] 35 % Set Rate:  [16 bmp] 16 bmp Vt Set:  [450 mL] 450 mL PEEP:  [5 cmH20] 5 cmH20 Plateau Pressure:  [16 cmH20] 16 cmH20  INTAKE / OUTPUT: Intake/Output     07/19 0701 - 07/20 0700 07/20 0701 - 07/21 0700   Other 380    NG/GT 690    Total Intake(mL/kg) 1070 (10.1)    Urine (mL/kg/hr) 1290 (0.5)    Total Output 1290     Net -220            PHYSICAL EXAMINATION: General:  Obese, NAD Neuro:  Diffusely weak, no focal deficits HEENT: PERRLA, EOMs intact, WNL, trach site clean, voice weak, but able to communicate on vent  Cardiovascular:  RRR no M/R/G Lungs: Clear  anteriorly Abdomen:  Obese, soft, +BS, G tube site clean Ext: minimal symmetric edema Derm: sacral decubitus ulcer   LABS:  I have reviewed all of today's lab results. Relevant abnormalities are discussed in the A/P section  ASSESSMENT / PLAN: Chronic ventilator dependent respiratory failure Chronic trach status  Recurrent trach tube malpositioning - trach changed 7/17 to XLT OHS/OSA Respiratory Arrest 7/15 - aspiration vs plug on ATC PLAN:    Cont PSV > ATC weaning as tolerated  Goal daytime trach collar, nocturnal vent  HTN, HLD Plan:   Cont Diltiazem, Metoprolol per home regimen  Cont labetalol PRN to keep SBP < 170  Hypokalemia Plan  Monitor BMET intermittently  Monitor I/Os  supp k if low after repeat NOW  Severe dysphagia. Failed FEEs 7/9 Chronic G tube Morbid obesity Plan:    SUP: enteral famotidine  Cont TFs  NPO except ice chips  Repeat SLP now with cuff down on trach collar, consider objective testing given prior hiostroy  Mild chronic anemia Plan:   DVT px: SQ heparin  Limit phlebotomy  Asymptomatic pyuria and bacteriuria  ?Asp event Plan:  Repeat UA   Hypothyroidism DM II Chronic steroids, indication is not clear, Polymyalgia rheumatica on record Plan:    Cont SSI  Cont lantus maintain current dose  Scheduled novolog  per diabetic coord rec's  Cont l-thyroxine  Cont to taper dexamethasone as tolerated  Profound deconditioning Plan:    Clonazepam, Fentanyl patch per preadmission regimen  Oxycodone PRN  PT/OT consult as able  Global: Anticipate DC to home in next day or two, slp now go from there with some observation of eating in house  I have fully examined this patient and agree with above findings.      [name]. Shelly Alias, MD, FACP Pgr: [phone] [name]

## 2013-08-28 NOTE — Progress Notes (Signed)
Speech Language Pathology Treatment: Dysphagia;Passy Muir Speaking valve  Patient Details Name: Shelly KohlerRuth M Shaheed MRN: [id number] DOB: 06/04/1945 Today's Date: 08/28/2013 Time: [id number] SLP Time Calculation (min): 55 min  Assessment / Plan / Recommendation Clinical Impression  Increased secretions noted from last week (prior to code).  Pt. Expressing herself in conversation with adequate vocal intensity, slightly hoarse quality with speaking valve donned.  Valve coughed/blown off intermittently throughout initial 5 minutes of session and able to wear during remainder without indications of air trapping.  Puree and nectar thick (recommended from FEES last week) were consumed with several throat clears; overall consumption appeared safe and efficient.  Recommend Dys 1 texture and nectar thick, must wear valve during meals/meds, no straws, pills whole in applesauce and sit upright.  SLP will treat while on acute care.   HPI HPI: [age] yoF former smoker, followed for asthma/ bronchitis, allergic rhinitis, Sleep apnea, OHS complicated by GERD, DM, polymyalgia rheumatica and CAD. Tracheostomy placed in NOvember 2014 after prolonged hospitalization for PNA and VDRF. Pt was  referred by Dr. Maple HudsonYoung for OP MBS due to exacerbation of trachel infection with green discharge, possibly related to food being found in trach stoma or during suction. Pt has been made NPO and feeding is restricted to PEG. MBS complete 7/7 with limited results due to patient body habitus. Subsequently seen in the trach clinic 7/7 and was noted to have a significant leak from the trach site. [facility] vent, and FEES to evaluate swallow. In-line PMSV evaluation also pending completion 7/10.    Pertinent Vitals WDL  SLP Plan  Continue with current plan of care    Recommendations Diet recommendations: Dysphagia 1 (puree);Nectar-thick liquid Liquids provided via: Cup;No straw Medication  Administration: Whole meds with puree Supervision: Patient able to self feed;Full supervision/cueing for compensatory strategies Compensations: Slow rate;Small sips/bites;Clear throat intermittently Postural Changes and/or Swallow Maneuvers: Seated upright 90 degrees;Upright 30-60 min after meal      Patient may use Passy-Muir Speech Valve: Intermittently with supervision PMSV Supervision: Full       Oral Care Recommendations: Oral care BID Follow up Recommendations: Home health SLP Plan: Continue with current plan of care    GO     Royce MacadamiaLisa Willis Taiven Greenley M.Ed [employer]SLP Pager [phone]  08/28/2013

## 2013-08-28 NOTE — Discharge Summary (Signed)
Physician Discharge Summary  Patient ID: RAHMAH MCCAMY MRN: [id number] DOB/AGE: 1945/10/18 68 y.o.  Admit date: 08/15/2013   Discharge date: 08/29/2013    Discharge Diagnoses:  Chronic ventilator dependent respiratory failure  Chronic trach status OSA / OHS Asthma HTN HLD Dysphagia Esophageal Reflux Chronic G tube  Morbid obesity Hypothyroidism  DM II  Chronic steroids, indication is not clear, Polymyalgia rheumatica on record Profound deconditioning Fibromyalgia UTI                                 DISCHARGE SUMMARY   KINSLEE DALPE is a 68 y.o. y/o female with a PMH of tracheostomy placed in 2014 (requiring nocturnal vent), OSA / OHS, Asthma, CAD, HLD, esophageal reflux, allergic rhinitis, DM II, osteoarthritis, fibromyalgia.  She was seen in the trach clinic on 7/7 and noted to have a significant air leak from trach with food material seen in trach site.  She was subsequently admitted for trach change. During hospitalization, she underwent FEES which she failed due to significant dysphagia.  SLP rec's at the time were to remain NPO. Hospital course was prolonged due to brief respiratory arrest while pt was on ATC (question aspiration vs plugged trach).  Due to recurrent trach tube malpositioning, her trach was then changed to an extra long trach and she was able to tolerate weaning to PSV with intermittent ATC. On 7/20, she passed an SLP evaluation with recs for puree - nectar thick liquids. She was deemed a candidate for discharge home on 7/21.                                                     DISCHARGE PLAN BY ACTIVE DIAGNOSIS    Chronic ventilator dependent respiratory failure  Chronic trach status OSA / OHS Asthma Discharge Plan:  - Continue goal ATC at 28% FIO2 throughout day with nocturnal vent (minimally 10p to 7a)   - vent settings: Trilogy vent IMV, PS 12, PEEP 5, VT 450, rate: 16,  O2 3-3.5 L to keep sat > 92%  - Continue outpatient Duonebs as needed    - has ROV w/ Dr Annamaria Boots for her chronic resp failure 9/1 and f/u in our trach clinic for routine trach change 8/19 (currently has 6 XLT  proximal trach).   HTN HLD Discharge Plan:  -Hold outpatient Diltiazem and Metoprolol until BP is assessed by Primary care  - Continue Potassium.  Dysphagia Esophageal Reflux Chronic G tube  Morbid obesity Discharge Plan:  - Diet recommendations: Dysphagia 3 (mechanical soft);Nectar-thick liquid Liquids provided via: Cup;No straw   Medication Administration: Whole meds with puree  - Continue outpatient Famotidine, Hyoscyamine, Senokot.  Hypothyroidism  DM II  Chronic steroids, indication is not clear, Polymyalgia rheumatica on record Discharge Plan:  - Continue outpatient Lantus, Levothyroxine, Decadron (decreased dose from prior regimen).  Profound deconditioning Fibromyalgia  Discharge Plan:  - Continue outpatient Clonazepam, Sertraline, Oxycodone.  UTI Discharge Plan:  - Continue foley use with strict foley care to allow for sacral wound healing  -Continue Augmentin TID for 7 days. Needs f/u from PCP cultures are pending.     SIGNIFICANT DIAGNOSTIC STUDIES / EVENTS FEES 7/9: failed w/ sig dysphagia. SLP rec: NPO  7/15: Respiratory arrest while on ATC. 7/17: Change  to extra-long trach. 7/19 Tolerating PSV of 5 cm H2O and short stints on ATC.  7/20 SLP Study >>> recommend dysphagia 1 (puree; nectar thick liquid). 7/21 SLP study>>> recommend dysphagia 3 and continue nectar liquids   MICRO DATA  Urine 7/7 >> E coli (pansens)  Blood 7/7 >> NEG  Resp 7/7 >> NEG  ANTIBIOTICS Ceftriaxone 7/09 >7/11 Augmentin 7/21 > end on 7/27   CONSULTS Dietary and SLP  TUBES / LINES Trach (chronic) last changed to proximal XLT on Friday 7/17 PEG (chronic) Foley   Discharge Exam:    Filed Vitals:   08/29/13 0852 08/29/13 1217 08/29/13 1300 08/29/13 1321  BP:   111/50   Pulse: 94 80 77 81  Temp:  98 F (36.7 C)    TempSrc:  Oral     Resp: $Remo'17 21 15 19  'Mbmpr$ Height:      Weight:      SpO2: 97% 98% 96% 98%   General: Obese, NAD  Neuro: Diffusely weak, no focal deficits  HEENT: PERRLA, EOMs intact, WNL, trach site clean, voice weak, but able to communicate on vent  Cardiovascular: RRR no M/R/G  Lungs: Clear anteriorly  Abdomen: Obese, soft, +BS, G tube site clean  Ext: minimal symmetric edema  Derm: sacral decubitus ulcer   Discharge Labs  BMET  Recent Labs Lab 08/23/13 1230 08/24/13 0032 08/25/13 0750 08/28/13 1300  NA 138 137 141 140  K 3.3* 3.6* 3.2* 3.5*  CL 90* 91* 94* 96  CO2 36* 34* 36* 31  GLUCOSE 200* 217* 147* 264*  BUN 28* 30* 21 27*  CREATININE 0.21* 0.23* <0.20* 0.24*  CALCIUM 9.3 9.1 9.2 9.1  MG  --  2.0  --   --   PHOS  --  3.3  --   --     CBC  Recent Labs Lab 08/23/13 1230 08/24/13 0032 08/25/13 0750  HGB 11.0* 10.4* 9.9*  HCT 36.0 35.0* 32.3*  WBC 13.9* 11.2* 8.9  PLT 268 267 258    Anti-Coagulation No results found for this basename: INR,  in the last 168 hours  Discharge Instructions   Call MD for:  difficulty breathing, headache or visual disturbances    Complete by:  As directed      Call MD for:  extreme fatigue    Complete by:  As directed      Call MD for:  temperature >100.4    Complete by:  As directed      Diet - low sodium heart healthy    Complete by:  As directed   Diet recommendations: Dysphagia 3 (mechanical soft);Nectar-thick liquid Liquids provided via: Cup;No straw Medication Administration: Whole meds with puree     Discharge instructions    Complete by:  As directed   Respiratory care: Continue trach collar 28% during day time Suction as needed Daily routine trach care  Rest on home vent nightly from 10p to 7 a Change foley every 30 days (last change on day of d/c 7/21)     Discharge wound care:    Complete by:  As directed   Sacral dressing change w/ saline wet to dry dressing daily     Increase activity slowly    Complete by:  As directed                  Follow-up Information   Follow up with [facility] On 09/27/2013. (1 pm for trach change )    Specialty:  Respiratory Therapy   Contact information:   [id number] [place] [address] [phone]      Follow up with Waymon Budge, MD On 10/10/2013. (2pm)    Specialty:  Pulmonary Disease   Contact information:   [address]  [facility] [place] [address] [phone]       Follow up with bliss, katherine . Schedule an appointment as soon as possible for a visit in 1 week.         Medication List    STOP taking these medications       diltiazem 30 MG tablet  Commonly known as:  CARDIZEM     metoprolol 50 MG tablet  Commonly known as:  LOPRESSOR      TAKE these medications       amoxicillin-clavulanate 250-62.5 MG/5ML suspension  Commonly known as:  AUGMENTIN  Place 10 mLs (500 mg total) into feeding tube every 8 (eight) hours.     chlorhexidine 0.12 % solution  Commonly known as:  PERIDEX  Use as directed 15 mLs in the mouth or throat 2 (two) times daily.     clonazePAM 0.5 MG tablet  Commonly known as:  KLONOPIN  Take 0.5 mg by mouth 2 (two) times daily as needed for anxiety.     dexamethasone 2 MG tablet  Commonly known as:  DECADRON  Take 2 mg by mouth daily.     dorzolamide 2 % ophthalmic solution  Commonly known as:  TRUSOPT  Place 1 drop into the right eye 2 (two) times daily.     famotidine 20 MG tablet  Commonly known as:  PEPCID  Take 20 mg by mouth 2 (two) times daily.     fentaNYL 100 MCG/HR  Commonly known as:  DURAGESIC - dosed mcg/hr  Place 1 patch (100 mcg total) onto the skin every 3 (three) days.     ferrous sulfate 325 (65 FE) MG tablet  Take 325 mg by mouth daily with breakfast.     food thickener Powd  Commonly known as:  THICK IT  For nectar thick liquids     guaiFENesin 600 MG 12 hr tablet  Commonly known as:   MUCINEX  Take 300 mg by mouth 2 (two) times daily as needed for cough or to loosen phlegm.     hyoscyamine 0.125 MG Tbdp disintergrating tablet  Commonly known as:  ANASPAZ  Place 0.125 mg under the tongue 2 (two) times daily.     insulin glargine 100 UNIT/ML injection  Commonly known as:  LANTUS  Inject 45 Units into the skin 2 (two) times daily. CBG >150     ipratropium-albuterol 0.5-2.5 (3) MG/3ML Soln  Commonly known as:  DUONEB  Take 3 mLs by nebulization every 6 (six) hours as needed.     LACTOBACILLUS PO  Take 1 tablet by mouth 2 (two) times daily.     latanoprost 0.005 % ophthalmic solution  Commonly known as:  XALATAN  Place 1 drop into both eyes at bedtime.     levothyroxine 50 MCG tablet  Commonly known as:  SYNTHROID, LEVOTHROID  Take 1 tablet (50 mcg total) by mouth daily before breakfast.     multivitamin with minerals Tabs tablet  Take 1 tablet by mouth daily.     oxyCODONE 5 MG immediate release tablet  Commonly known as:  Oxy IR/ROXICODONE  Take 1 tablet (5 mg total) by mouth every 8 (eight) hours as needed for severe pain.  potassium chloride 20 MEQ/15ML (10%) solution  Take 20 mEq by mouth daily.     senna-docusate 8.6-50 MG per tablet  Commonly known as:  Senokot-S  Take 1 tablet by mouth 2 (two) times daily.     sertraline 25 MG tablet  Commonly known as:  ZOLOFT  Take 25 mg by mouth daily.     SYSTANE BALANCE 0.6 % Soln  Generic drug:  Propylene Glycol  Apply 1 drop to eye 2 (two) times daily. Both eyes     vitamin C 500 MG tablet  Commonly known as:  ASCORBIC ACID  Take 500 mg by mouth 2 (two) times daily.     zinc gluconate 50 MG tablet  Take 50 mg by mouth daily.          Disposition: Discharged to home with 16 hr/day home nursing.   Discharged Condition: GERLENE GLASSBURN has met maximum benefit of inpatient care and is medically stable and cleared for discharge.  Patient is pending follow up as above.     Follow-up: Home visits  with primary care provider Bernie Covey [phone]  Time spent on disposition:  Greater than 45 minutes.

## 2013-08-29 ENCOUNTER — Ambulatory Visit: Payer: Medicare Other | Admitting: Internal Medicine

## 2013-08-29 ENCOUNTER — Inpatient Hospital Stay (HOSPITAL_COMMUNITY): Payer: Medicare Other

## 2013-08-29 LAB — GLUCOSE, CAPILLARY
Glucose-Capillary: 199 mg/dL — ABNORMAL HIGH (ref 70–99)
Glucose-Capillary: 201 mg/dL — ABNORMAL HIGH (ref 70–99)
Glucose-Capillary: 228 mg/dL — ABNORMAL HIGH (ref 70–99)
Glucose-Capillary: 231 mg/dL — ABNORMAL HIGH (ref 70–99)
Glucose-Capillary: 292 mg/dL — ABNORMAL HIGH (ref 70–99)

## 2013-08-29 MED ORDER — FENTANYL 100 MCG/HR TD PT72: 100.0000 ug | MEDICATED_PATCH | TRANSDERMAL | Status: AC

## 2013-08-29 MED ORDER — AMOXICILLIN-POT CLAVULANATE 250-62.5 MG/5ML PO SUSR: 500.0000 mg | Freq: Three times a day (TID) | ORAL | Status: AC

## 2013-08-29 MED ORDER — OXYCODONE HCL 5 MG PO TABS: 5.0000 mg | ORAL_TABLET | Freq: Three times a day (TID) | ORAL | Status: DC | PRN

## 2013-08-29 MED ORDER — AMOXICILLIN-POT CLAVULANATE 250-62.5 MG/5ML PO SUSR
500.0000 mg | Freq: Three times a day (TID) | ORAL | Status: DC
  Administered 2013-08-29: 500 mg
  Filled 2013-08-29 (×3): qty 10

## 2013-08-29 MED ORDER — STARCH (THICKENING) PO POWD: ORAL | Status: DC

## 2013-08-29 MED ORDER — POTASSIUM CHLORIDE 20 MEQ/15ML (10%) PO LIQD
40.0000 meq | Freq: Once | ORAL | Status: AC
  Administered 2013-08-29: 40 meq
  Filled 2013-08-29: qty 30

## 2013-08-29 MED ORDER — LEVOTHYROXINE SODIUM 50 MCG PO TABS: 50.0000 ug | ORAL_TABLET | Freq: Every day | ORAL | Status: AC

## 2013-08-29 NOTE — Procedures (Signed)
Patient seen and examined, agree with above note.  I dictated the care and orders written for this patient under my direction.  Wesam G Yacoub, MD [phone] 

## 2013-08-29 NOTE — Progress Notes (Signed)
Physical Therapy Treatment Patient Details Name: Shelly KohlerRuth M Kelley MRN: [id number] DOB: 12/24/1945 Today's Date: 08/29/2013    History of Present Illness Pt is a 68 y/o female who is a chronic trach patient, initially placed November 2014. Pt presents with a trach leak and suspician for aspiration pneumonia, as there were food particles in the trach.   Initial PT eval 08/18/13.  Discontinued PT 08/23/13 due to patient refusal of service.  New PT consult orders - patient declining SNF, wants to return home.    PT Comments    Pt admitted with above. Pt currently with functional limitations due to strength and endurance deficits.  Pt will benefit from skilled PT to increase their independence and safety with mobility to allow discharge to the venue listed below.    Follow Up Recommendations  No PT follow up;Supervision/Assistance - 24 hour (Patient to have Nursing assist at home 16 hrs/day)     Equipment Recommendations  None recommended by PT    Recommendations for Other Services       Precautions / Restrictions Precautions Precautions: Fall Restrictions Weight Bearing Restrictions: No    Mobility  Bed Mobility Overal bed mobility: Needs Assistance;+2 for physical assistance Bed Mobility: Rolling Rolling: Mod assist;+2 for physical assistance         General bed mobility comments: Pt declined EOB.  Turned pt to right at end of treatment to as she was lying on left side on arrival.    Transfers                    Ambulation/Gait                 Stairs            Wheelchair Mobility    Modified Rankin (Stroke Patients Only)       Balance                                    Cognition Arousal/Alertness: Awake/alert Behavior During Therapy: Anxious Overall Cognitive Status: Difficult to assess                      Exercises General Exercises - Upper Extremity Shoulder Flexion: AAROM;Both;5 reps;Supine Shoulder ABduction:  AAROM;Both;5 reps;Supine Elbow Flexion: AROM;Both;5 reps;Supine Elbow Extension: AROM;Both;5 reps;Supine General Exercises - Lower Extremity Ankle Circles/Pumps: PROM;Both;5 reps;Supine Heel Slides: AAROM;Both;5 reps;Supine Hip ABduction/ADduction: AAROM;Both;5 reps;Supine Straight Leg Raises: AAROM;Both;5 reps;Supine    General Comments General comments (skin integrity, edema, etc.): Multiple wounds noted on LEs.      Pertinent Vitals/Pain VSS, No pain    Home Living                      Prior Function            PT Goals (current goals can now be found in the care plan section) Progress towards PT goals: Progressing toward goals    Frequency  Min 2X/week    PT Plan Current plan remains appropriate    Co-evaluation             End of Session Equipment Utilized During Treatment: Oxygen Activity Tolerance: Patient limited by fatigue Patient left: in bed;with call bell/phone within reach     Time: [id number] PT Time Calculation (min): 13 min  Charges:  $Therapeutic Exercise: 8-22 mins  G Codes:      INGOLD,Samani Deal 08/29/2013, 12:16 PM [facility] [phone] (pager)

## 2013-08-29 NOTE — Clinical Social Work Note (Signed)
CSW has prepared transport packet for [employer] [facility]. Packet has been placed in chart. Husband aware of transport for 6:30PM to home. CSW provided all requested information (standby vent settings, trach settings etc.) to transport company.   Roddie McBryant Dwane Andres MSW, [place]CSWA, [place], [phone]

## 2013-08-29 NOTE — Progress Notes (Addendum)
Name: [name] MRN: [id number] DOB: 05/07/1945    ADMISSION DATE:  08/15/2013 CONSULTATION DATE:  08/15/2013  REFERRING MD :  [facility] PRIMARY SERVICE: PCCM  CHIEF COMPLAINT:  Tracheostomy air leak and food matter in the tracheostomy.  BRIEF PATIENT DESCRIPTION: 68 yo morbidly obese with tracheostomy placed in November 2014 after prolonged hospitalization for PNA and VDRF.  Seen in the trach clinic 7/7 and was noted to have a significant leak from the trach site. The patient also has been aspirating (she evidently passed a swallow evaluation and had been eating pta  SIGNIFICANT EVENTS / STUDIES:  FEES 7/9: failed w/ sig dysphagia. SLP rec: NPO 7/15: Respiratory arrest 7/17: Change to extra-long trach 7/19 Tolerating PSV of 5 cm H2) and short stints on ATC.   LINES / TUBES: Trach (chronic) PEG (chronic)  CULTURES: Urine 7/7 >> E coli (pansens) Blood 7/7 >> NEG Resp 7/7 >> NEG  ANTIBIOTICS: Ceftriaxone 7/09 >7/11  Subjective: No new complaints, passed her swallow study with puree and was informed she would be discharged today to home with 16 hr.home nursing care previously estbalished  VITAL SIGNS: Temp:  [98 F (36.7 C)-99.4 F (37.4 C)] 98.3 F (36.8 C) (07/21 0341) Pulse Rate:  [59-85] 65 (07/21 0600) Resp:  [1-21] 13 (07/21 0600) BP: (97-147)/(44-76) 108/44 mmHg (07/21 0600) SpO2:  [96 %-100 %] 98 % (07/21 0600) FiO2 (%):  [28 %-35 %] 28 % (07/21 0600)  VENTILATOR SETTINGS: Vent Mode:  [-]  FiO2 (%):  [28 %-35 %] 28 %  INTAKE / OUTPUT: Intake/Output     07/20 0701 - 07/21 0700 07/21 0701 - 07/22 0700   P.O. 120    Other 140    NG/GT 690    Total Intake(mL/kg) 950 (8.9)    Urine (mL/kg/hr) 1075 (0.4)    Total Output 1075     Net -125            PHYSICAL EXAMINATION: General:  Obese, NAD Neuro:  Diffusely weak, no focal deficits HEENT: PERRLA, EOMs intact, WNL, trach site clean, voice weak, but able to communicate on vent  Cardiovascular:   RRR no M/R/G Lungs: Clear anteriorly Abdomen:  Obese, soft, +BS, G tube site clean Ext: minimal symmetric edema Derm: sacral decubitus ulcer   LABS:  Recent Labs Lab 08/24/13 0032 08/25/13 0750 08/28/13 1300  NA 137 141 140  K 3.6* 3.2* 3.5*  CL 91* 94* 96  CO2 34* 36* 31  BUN 30* 21 27*  CREATININE 0.23* <0.20* 0.24*  GLUCOSE 217* 147* 264*    Recent Labs Lab 08/23/13 1230 08/24/13 0032 08/25/13 0750  HGB 11.0* 10.4* 9.9*  HCT 36.0 35.0* 32.3*  WBC 13.9* 11.2* 8.9  PLT 268 267 258      ASSESSMENT / PLAN: Chronic ventilator dependent respiratory failure Chronic trach status  Recurrent trach tube malpositioning - trach changed 7/17 to XLT OHS/OSA Respiratory Arrest 7/15 - aspiration vs plug on ATC PLAN:    Cont PSV > ATC weaning as tolerated  Goal daytime trach collar, nocturnal vent  For home vent for transition home , start here then to ambulance  HTN, HLD Plan:   Cont Diltiazem, Metoprolol per home regimen  Cont labetalol PRN to keep SBP < 170, not needed, dc  Hypokalemia Plan  Monitor BMET intermittently  Monitor I/Os  supp k then recheck as outpt  Severe dysphagia. Passed FEEs 7/21 Chronic G tube Morbid obesity Plan:    D/C SUP: enteral  famotidine  Cont TFs  Passed swallow study with pureed foods  7/21   Mild chronic anemia Plan:   DVT px: SQ heparin  Limit phlebotomy  Asymptomatic pyuria and bacteriuria vs UTI clinically relavent ?Asp event Plan:  Start augmentin, culture urine, follow growth as outpt, plan 7 days  Nursing to change out foley prior to discharge home to allow for sacral wound healing  See last E coli noted, sens organisms noted  Hypothyroidism DM II Chronic steroids, indication is not clear, Polymyalgia rheumatica on record Plan:    Cont SSI  Cont lantus maintain current dose  Scheduled novolog per diabetic coord rec's  Cont l-thyroxine  Cont to taper dexamethasone as tolerated  Profound deconditioning Plan:     Clonazepam, Fentanyl patch per preadmission regimen  Oxycodone PRN  PT/OT consult as able MUSCULOSKELETAL A: Sacral wounds P: Wounds non-healing Dressing changes daily Maintain foley cath to allow for tissue healing  Global: Anticipate DC to home, add urine culture, abx augmentin, k supp  I have fully examined this patient and agree with above findings.     Mcarthur Rossetti. Tyson Alias, MD, FACP Pgr: [phone] [facility]

## 2013-08-29 NOTE — Progress Notes (Signed)
Called report to [facility] at the patient's home. All care instructions were discussed and questions answered.

## 2013-08-29 NOTE — Progress Notes (Signed)
Speech Language Pathology Treatment: Dysphagia  Patient Details Name: [name] MRN: [id number] DOB: 06/12/1945 Today's Date: 08/29/2013 Time: [id number] SLP Time Calculation (min): 37 min  Assessment / Plan / Recommendation Clinical Impression  Pt. observed with portion of breakfast with PMSV donned upon SLP arrival.  No indications of penetration or aspiration with nectar thick liquids.  Trials of upgraded texture with saltine cracker provided without significant difficulty.  Recommend texture upgrade to Dys 3 and continue nectar liquids.  Pt. Re-educated on clinical reasoning for thickener.  Husband educated via phone results of FEES last week, recommendations and progress yesterday and today.  Educated re: thickener and food textures for home with verbalized education.   HPI HPI: [age] yoF former smoker, followed for asthma/ bronchitis, allergic rhinitis, Sleep apnea, OHS complicated by GERD, DM, polymyalgia rheumatica and CAD. Tracheostomy placed in NOvember 2014 after prolonged hospitalization for PNA and VDRF. Pt was  referred by Dr. Maple HudsonYoung for OP MBS due to exacerbation of trachel infection with green discharge, possibly related to food being found in trach stoma or during suction. Pt has been made NPO and feeding is restricted to PEG. MBS complete 7/7 with limited results due to patient body habitus. Subsequently seen in the trach clinic 7/7 and was noted to have a significant leak from the trach site. [facility], vent, and FEES to evaluate swallow. In-line PMSV evaluation also pending completion 7/10.    Pertinent Vitals WDL's  SLP Plan  Continue with current plan of care    Recommendations Diet recommendations: Dysphagia 3 (mechanical soft);Nectar-thick liquid Liquids provided via: Cup;No straw Medication Administration: Whole meds with puree Supervision: Patient able to self feed;Full supervision/cueing for compensatory  strategies Compensations: Slow rate;Small sips/bites;Clear throat intermittently Postural Changes and/or Swallow Maneuvers: Seated upright 90 degrees;Upright 30-60 min after meal      Patient may use Passy-Muir Speech Valve: Intermittently with supervision PMSV Supervision: Full       Oral Care Recommendations: Oral care BID Follow up Recommendations:  (pt. has home RN, no SLP unless difficulty at home) Plan: Continue with current plan of care    GO     Royce MacadamiaLisa Willis Shelia Magallon M.Ed [employer]-SLP Pager [phone]  08/29/2013

## 2013-08-29 NOTE — Progress Notes (Signed)
Transport arrived to transport patient home. All paperwork and prescriptions sent with patient as well as all belongings.Patient was stable on trach collar at time of departure. IV's D/C'd per the transport EMT. Peg Tube flushed, patient suctioned. Trach, replacement trach and obturator all sent with patient after trach care done. Patient was hemodynamically stable at time of departure. Will attempt to call husband and get home health number for report.

## 2013-08-29 NOTE — Clinical Social Work Note (Signed)
CSW has scheduled transport for patient to home with [employer] transportation and updated husband. Transport scheduled for 6:30PM.  Roddie McBryant Karman Veney MSW, Theresia MajorsLCSWA, [place], [phone]

## 2013-08-29 NOTE — Progress Notes (Signed)
Inpatient Diabetes Program Recommendations  AACE/ADA: New Consensus Statement on Inpatient Glycemic Control (2013)  Target Ranges:  Prepandial:   less than 140 mg/dL      Peak postprandial:   less than 180 mg/dL (1-2 hours)      Critically ill patients:  140 - 180 mg/dL   Results for [name], [name] (MRN [id number]) as of 08/29/2013 11:24  Ref. Range 08/28/2013 00:13 08/28/2013 04:13 08/28/2013 08:12 08/28/2013 12:05 08/28/2013 16:22 08/28/2013 19:49 08/29/2013 00:46 08/29/2013 03:37 08/29/2013 08:24  Glucose-Capillary Latest Range: 70-99 mg/dL 409167 (H) 811147 (H) 914181 (H) 243 (H) 282 (H) 289 (H) 228 (H) 231 (H) 201 (H)   Diabetes history: DM2  Outpatient Diabetes medications: Lantus 45 units BID  Current orders for Inpatient glycemic control: Lantus 30 units BID, Novolog 0-20 units Q4H, Novolog 4 units Q4H (tube feeding coverage)  Inpatient Diabetes Program Recommendations Insulin - Basal: Please consider increasing Lantus to 35 units BID.  Thanks, Orlando PennerMarie Matilyn Fehrman, RN, MSN, CCRN Diabetes Coordinator Inpatient Diabetes Program [phone] (Team Pager) [phone] ([facility] office) [phone] [facility] office)

## 2013-08-30 NOTE — Discharge Summary (Signed)
Examined in full  Agree with above Passed swallow Home vent transition  Mcarthur Rossettianiel J. Tyson AliasFeinstein, MD, FACP Pgr: [phone] [facility]

## 2013-08-30 NOTE — Progress Notes (Signed)
Pt examined in full See my daily note for managemt in addition to above  Mcarthur Rossettianiel J. Tyson AliasFeinstein, MD, FACP Pgr: [phone] [facility]'

## 2013-09-01 LAB — URINE CULTURE: Colony Count: >=100000

## 2013-09-04 ENCOUNTER — Telehealth: Payer: Self-pay | Admitting: Internal Medicine

## 2013-09-04 DIAGNOSIS — G4733 Obstructive sleep apnea (adult) (pediatric): Secondary | ICD-10-CM

## 2013-09-04 DIAGNOSIS — J9601 Acute respiratory failure with hypoxia: Secondary | ICD-10-CM

## 2013-09-04 NOTE — Telephone Encounter (Signed)
Ok to order as requested with prn refills

## 2013-09-04 NOTE — Telephone Encounter (Signed)
Per OV 07/31/13: Patient Instructions      Order- Referral to Pulmonary Tracheostomy clinic at [facility] (Dr Carlynn HeraldFeinstein et al) to have # 6 Portex cuffed trach changed to a # 7 cuffed tube to reduce spontaneous extubation and to better protect airway from food at stoma. Recommend she be evaluated by GI Dr Markham JordanElliot to consider changing to NPO with all feeding per PEG. Order Portable CXR to be done same day as trach clinic visit   For dx chronic hypoxic respiratory failure  ---  [facility]. [facility] a Shiley trach disposable inner canula 6.0. Reports pt is all out now. When order is sent reports it needs Dr. Roxy CedarYoung's NPI # on order as well. Please advise thanks

## 2013-09-04 NOTE — Telephone Encounter (Signed)
[facility] and i [facility] a [facility].  Nothing further is needed.

## 2013-09-05 ENCOUNTER — Telehealth: Payer: Self-pay | Admitting: Internal Medicine

## 2013-09-05 DIAGNOSIS — J9601 Acute respiratory failure with hypoxia: Secondary | ICD-10-CM

## 2013-09-05 NOTE — Telephone Encounter (Signed)
Ok- please order Shiley trach tube 6 XLT and inner cannula 6/0, proximal, cuffed, with spare, and replace as needed or per protocol for dx COPD, Chronic hypoxic respiratory failure

## 2013-09-05 NOTE — Telephone Encounter (Signed)
[facility] States that they are not able to tell us verbatim what the Rx should say..they can however give pointers of what needs to be included.  Patient Name/DOB Physician Name/Location Contact Number  Type - Shiley Size - 6XLT Inner Cannula 6/0 Is it cuffed or uncuffed? Proximal or Distal?   Please advise Dr Maple HudsonYoung on the above to clarify order. Thanks.

## 2013-09-05 NOTE — Telephone Encounter (Signed)
[facility] and [facility]

## 2013-09-05 NOTE — Telephone Encounter (Signed)
[facility] She reports the order we sent over yesterday needs to state to also state if trach is cuffed or uncuffed. If it is proximal or distal? Also need to stated it is a shiley 6XLT trach and inner canula 6/0. Please advise Dr. Maple HudsonYoung thanks

## 2013-09-05 NOTE — Telephone Encounter (Signed)
The patient has a tracheostomy in place, so we need [facility] to be very specific and tell us word for word what order they want and we can do that.

## 2013-09-12 ENCOUNTER — Inpatient Hospital Stay: Payer: Medicare Other | Admitting: Adult Health

## 2013-09-26 NOTE — Progress Notes (Signed)
Called to confirm trach clinic appt. Spoke with home health nurse. Confirmed appt with RN, pt instructed to arrive at 12:30.

## 2013-09-27 ENCOUNTER — Telehealth: Payer: Self-pay | Admitting: Internal Medicine

## 2013-09-27 ENCOUNTER — Ambulatory Visit (HOSPITAL_COMMUNITY)
Admit: 2013-09-27 | Discharge: 2013-09-27 | Disposition: A | Discharge status: Home / Self Care | Payer: Medicare Other | Attending: Pulmonary Disease | Admitting: Pulmonary Disease

## 2013-09-27 DIAGNOSIS — J9382 Other air leak: Secondary | ICD-10-CM | POA: Insufficient documentation

## 2013-09-27 DIAGNOSIS — G4733 Obstructive sleep apnea (adult) (pediatric): Secondary | ICD-10-CM | POA: Diagnosis not present

## 2013-09-27 DIAGNOSIS — J449 Chronic obstructive pulmonary disease, unspecified: Secondary | ICD-10-CM | POA: Insufficient documentation

## 2013-09-27 DIAGNOSIS — J9610 Chronic respiratory failure, unspecified whether with hypoxia or hypercapnia: Secondary | ICD-10-CM

## 2013-09-27 DIAGNOSIS — Z930 Tracheostomy status: Secondary | ICD-10-CM | POA: Diagnosis not present

## 2013-09-27 DIAGNOSIS — IMO0002 Reserved for concepts with insufficient information to code with codable children: Secondary | ICD-10-CM

## 2013-09-27 DIAGNOSIS — I509 Heart failure, unspecified: Secondary | ICD-10-CM | POA: Insufficient documentation

## 2013-09-27 DIAGNOSIS — J4489 Other specified chronic obstructive pulmonary disease: Secondary | ICD-10-CM | POA: Insufficient documentation

## 2013-09-27 DIAGNOSIS — E662 Morbid (severe) obesity with alveolar hypoventilation: Secondary | ICD-10-CM | POA: Insufficient documentation

## 2013-09-27 DIAGNOSIS — T17908S Unspecified foreign body in respiratory tract, part unspecified causing other injury, sequela: Secondary | ICD-10-CM

## 2013-09-27 DIAGNOSIS — T17908D Unspecified foreign body in respiratory tract, part unspecified causing other injury, subsequent encounter: Secondary | ICD-10-CM

## 2013-09-27 DIAGNOSIS — R0902 Hypoxemia: Secondary | ICD-10-CM

## 2013-09-27 DIAGNOSIS — Z430 Encounter for attention to tracheostomy: Secondary | ICD-10-CM

## 2013-09-27 DIAGNOSIS — J9611 Chronic respiratory failure with hypoxia: Secondary | ICD-10-CM
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
## 2013-09-27 NOTE — Telephone Encounter (Signed)
Spoke with Dr Ewell PoeYacoub-he states this can wait until morning for CY to address.

## 2013-09-27 NOTE — Progress Notes (Signed)
Tracheostomy Procedure Note  Shelly KohlerRuth M Kelley [id number] 08/18/1945  Pre Procedure Tracheostomy Information  Trach Brand: Shiley Size: 6.0 Style: Cuffed and Proximal Secured by: Velcro   Procedure: Only trach cleaning done on this visit. Pt referred to SLP for swallow eval and nutrition for tube feeds needs. No trach change at this time per MD.        Post Procedure Evaluation:   Vital signs:blood pressure 106/58, pulse 60, respirations 24 and pulse oximetry 97 % Patients current condition: stable Complications: No apparent complications Trach site exam: clean, dry, Wound care done: dry and 4 x 4 gauze Patient did tolerate procedure well.    Prescription needs: Pt was referred to SLP for swallow eval and nutrition for tube feed needs.    Additional needs: none

## 2013-09-27 NOTE — Telephone Encounter (Signed)
Paged Dr Molli KnockYacoub; does he need this now or can it hold til morning as CY is off this afternoon.

## 2013-09-28 NOTE — Telephone Encounter (Signed)
Ok to order repeat Speech path swallowing eval and Nutrition consults. Dx tracheostomy, aspiration, chronic respiratory failure.

## 2013-09-28 NOTE — Telephone Encounter (Signed)
Orders place. Nothing further needed.

## 2013-09-29 ENCOUNTER — Telehealth: Payer: Self-pay | Admitting: Internal Medicine

## 2013-09-29 DIAGNOSIS — E119 Type 2 diabetes mellitus without complications: Secondary | ICD-10-CM

## 2013-09-29 DIAGNOSIS — J9611 Chronic respiratory failure with hypoxia: Secondary | ICD-10-CM

## 2013-09-29 DIAGNOSIS — T17908D Unspecified foreign body in respiratory tract, part unspecified causing other injury, subsequent encounter: Secondary | ICD-10-CM

## 2013-09-29 NOTE — Telephone Encounter (Signed)
lmomtcb x1 

## 2013-09-29 NOTE — Telephone Encounter (Signed)
Will this need to come from the pts PCP or can we send in order to DME to help out with the tube feedings?  Please advise. thanks

## 2013-09-29 NOTE — Telephone Encounter (Signed)
[name] called back. Pt had aspiration test done the other day. On assesment/plan. Pt needs to see a nutrionists for tube feeding.  Per tina pt still does have tube feeding at home and supplies.  Can we set up a nutritionists for this or does this need to come from PCP. Please advise Dr. Maple HudsonYoung thanks

## 2013-09-29 NOTE — Telephone Encounter (Signed)
-----   Message ----- From: Gevena BarreEarl D Jenkins III Sent: 09/29/2013 8:02 AM To: Waymon Budgelinton D Young, MD, Alyson ReedyWesam G Yacoub, MD, * Subject: TF Nutrition Evaluation Greetings, [facility] for enteral feeding evaluation. Unfortunately, we are unable to assist because this center specializes in counseling and education. Tube feeding referrals would need to be directed toward a home health agency that performs evaluations and provides supplies to support enteral feeding. Regards, Manson Passeyave Jenkins Manager, [facility]

## 2013-09-29 NOTE — Telephone Encounter (Signed)
Per CY--  Ok to order this from us. Order has been placed for DME and i have called tina and she is aware that we will set this up and they will call to set this up for her.

## 2013-10-06 ENCOUNTER — Telehealth: Payer: Self-pay | Admitting: Internal Medicine

## 2013-10-06 DIAGNOSIS — Z930 Tracheostomy status: Secondary | ICD-10-CM

## 2013-10-06 DIAGNOSIS — T17908D Unspecified foreign body in respiratory tract, part unspecified causing other injury, subsequent encounter: Secondary | ICD-10-CM

## 2013-10-06 DIAGNOSIS — J9611 Chronic respiratory failure with hypoxia: Secondary | ICD-10-CM

## 2013-10-06 NOTE — Telephone Encounter (Signed)
Spoke with [name]'s home nurse, [name].  Explained below to her per CY.  [name] wanting to know if there is any way 9/1 HFU appt with CY can be coordinated on the same day with the 4 wk follow up at trach clinic because transportation is an issue.    I spoke with [facility].  Sept trach change and OV with Dr. Maple Hudson unable to be coordinated.  [name] will need to call to schedule trach clinic appt to coordinate with their schedule.  Dr. Maple Hudson, [name] was last seen by you on June 22,2015.  At that time, it was rec she f/u in 6 months.  However, on July 7,2015 she was admitted to [facility] and was d/c'd on 08/29/13.  The Sept 1 appt with you was scheduled from this hospital admission.  Per our verbal conversation on [name] and [name]'s follow ups with you, will you please confirm if [name] should keep the Sept 1 appt with you.  If you feel this isn't necessary, please advise on when [name] should follow up with you.

## 2013-10-06 NOTE — Telephone Encounter (Signed)
CY and I will talk about this on Monday!

## 2013-10-06 NOTE — Telephone Encounter (Signed)
Called and spoke with TIna and she stated that they wanted to see if the trach could be changed at the OV on 9-1 with CY.  Looks like she was told to come back to the trach clinic in 4 weeks (last ov was 8-19) and they would change and recheck her trach at that time.  Inetta Fermo stated that the pts husband stated that it is costing a lot of money for transport of pt back and forth to doctor visits.  i will forward to CY to make him aware.    Order got back from Dr. Clayborn Bigness to start back on the nutrition supplement 75ml  From 7am to 7pm.    Dr. Juliann Pares is the pts cardiologist and his number is [phone] Dr. Mechele Collin with [facility] is the GI doctor and his number is [phone]

## 2013-10-06 NOTE — Telephone Encounter (Signed)
We do not have the facilities, supplies etc needed here to change trachs. Sorry.

## 2013-10-06 NOTE — Telephone Encounter (Signed)
Are we able to work out something satisfactory for her, or do you and i need to talk about options?

## 2013-10-09 NOTE — Telephone Encounter (Signed)
LMTCB

## 2013-10-09 NOTE — Telephone Encounter (Signed)
Probably best to keep scheduled post-hosp ov here, then we will decide about longer term. What ever helps her.

## 2013-10-09 NOTE — Telephone Encounter (Signed)
CY, we just need to know when you want patient to come back in for ROV; she was in hospital recently. She has to depend on transportation and also has to go trach clinic. We need to know if you want her to keep Post hospital OV with you and then start seeing you every 4-6 months or what your suggestions would be. Thanks.

## 2013-10-10 ENCOUNTER — Ambulatory Visit: Payer: Medicare Other | Admitting: Internal Medicine

## 2013-10-11 NOTE — Telephone Encounter (Signed)
LMTCB

## 2013-10-12 NOTE — Telephone Encounter (Signed)
LMTCB

## 2013-10-13 NOTE — Telephone Encounter (Signed)
lmomtcb x 2  

## 2013-10-13 NOTE — Telephone Encounter (Signed)
Inetta Fermo called back and she was advised to call the pts PCP to see when they can come in and see them for HFU as well.  TIna stated that the pt needs to be set up with speech path to see about her aspiration and pt is wanting to get off of the tube feedings and be able to eat normal food.  Inetta Fermo will call PCP to set this appt up and i will send this to Katie to see when we can set her up to see CY.

## 2013-10-17 NOTE — Telephone Encounter (Signed)
Called and lmomtcb for [name] to advise her of CY recs.

## 2013-10-17 NOTE — Telephone Encounter (Signed)
I expect her to remain at high risk for aspiration. We have an appointment to see me in December. We were trying to help her control costs by spreading appointments out, if stable. Have her keep the swallowing eval appointment and lets see what that shows. We can reconsider f/u here after that.

## 2013-10-18 NOTE — Telephone Encounter (Signed)
Order placed again as initial order was not placed correctly.  Per Florentina Addison, she guided me in which test Dr Maple Hudson was requesting be completed.   Will send to [facility] as urgent to schedule this patient as they have been waiting x 1 month for call from out office.  Felicie Morn [facility] Nurse) is with patient today--aware that we will call her once appt is scheduled.

## 2013-10-19 ENCOUNTER — Other Ambulatory Visit (HOSPITAL_COMMUNITY): Payer: Self-pay | Admitting: Internal Medicine

## 2013-10-19 DIAGNOSIS — R1310 Dysphagia, unspecified: Secondary | ICD-10-CM

## 2013-10-20 ENCOUNTER — Encounter (HOSPITAL_COMMUNITY): Payer: Self-pay | Admitting: Emergency Medicine

## 2013-10-20 ENCOUNTER — Emergency Department (HOSPITAL_COMMUNITY): Payer: Medicare Other

## 2013-10-20 ENCOUNTER — Inpatient Hospital Stay (HOSPITAL_COMMUNITY)
Admission: EM | Admit: 2013-10-20 | Discharge: 2013-11-09 | DRG: 870 | Disposition: E | Payer: Medicare Other | Attending: Pulmonary Disease | Admitting: Pulmonary Disease

## 2013-10-20 DIAGNOSIS — Z9911 Dependence on respirator [ventilator] status: Secondary | ICD-10-CM

## 2013-10-20 DIAGNOSIS — E039 Hypothyroidism, unspecified: Secondary | ICD-10-CM | POA: Diagnosis present

## 2013-10-20 DIAGNOSIS — L8993 Pressure ulcer of unspecified site, stage 3: Secondary | ICD-10-CM | POA: Diagnosis present

## 2013-10-20 DIAGNOSIS — E46 Unspecified protein-calorie malnutrition: Secondary | ICD-10-CM | POA: Diagnosis present

## 2013-10-20 DIAGNOSIS — E872 Acidosis, unspecified: Secondary | ICD-10-CM | POA: Diagnosis present

## 2013-10-20 DIAGNOSIS — I959 Hypotension, unspecified: Secondary | ICD-10-CM | POA: Diagnosis present

## 2013-10-20 DIAGNOSIS — Z930 Tracheostomy status: Secondary | ICD-10-CM | POA: Diagnosis not present

## 2013-10-20 DIAGNOSIS — J9600 Acute respiratory failure, unspecified whether with hypoxia or hypercapnia: Secondary | ICD-10-CM

## 2013-10-20 DIAGNOSIS — J151 Pneumonia due to Pseudomonas: Secondary | ICD-10-CM | POA: Diagnosis present

## 2013-10-20 DIAGNOSIS — Z6841 Body Mass Index (BMI) 40.0 and over, adult: Secondary | ICD-10-CM

## 2013-10-20 DIAGNOSIS — J90 Pleural effusion, not elsewhere classified: Secondary | ICD-10-CM | POA: Diagnosis not present

## 2013-10-20 DIAGNOSIS — Z931 Gastrostomy status: Secondary | ICD-10-CM

## 2013-10-20 DIAGNOSIS — J9589 Other postprocedural complications and disorders of respiratory system, not elsewhere classified: Secondary | ICD-10-CM | POA: Diagnosis not present

## 2013-10-20 DIAGNOSIS — I2510 Atherosclerotic heart disease of native coronary artery without angina pectoris: Secondary | ICD-10-CM | POA: Diagnosis present

## 2013-10-20 DIAGNOSIS — A419 Sepsis, unspecified organism: Secondary | ICD-10-CM | POA: Diagnosis present

## 2013-10-20 DIAGNOSIS — D638 Anemia in other chronic diseases classified elsewhere: Secondary | ICD-10-CM | POA: Diagnosis present

## 2013-10-20 DIAGNOSIS — E119 Type 2 diabetes mellitus without complications: Secondary | ICD-10-CM | POA: Diagnosis present

## 2013-10-20 DIAGNOSIS — G4733 Obstructive sleep apnea (adult) (pediatric): Secondary | ICD-10-CM | POA: Diagnosis present

## 2013-10-20 DIAGNOSIS — E8779 Other fluid overload: Secondary | ICD-10-CM | POA: Diagnosis present

## 2013-10-20 DIAGNOSIS — Z794 Long term (current) use of insulin: Secondary | ICD-10-CM | POA: Diagnosis not present

## 2013-10-20 DIAGNOSIS — R609 Edema, unspecified: Secondary | ICD-10-CM

## 2013-10-20 DIAGNOSIS — A403 Sepsis due to Streptococcus pneumoniae: Principal | ICD-10-CM | POA: Diagnosis present

## 2013-10-20 DIAGNOSIS — E785 Hyperlipidemia, unspecified: Secondary | ICD-10-CM | POA: Diagnosis present

## 2013-10-20 DIAGNOSIS — IMO0002 Reserved for concepts with insufficient information to code with codable children: Secondary | ICD-10-CM | POA: Diagnosis not present

## 2013-10-20 DIAGNOSIS — B3749 Other urogenital candidiasis: Secondary | ICD-10-CM | POA: Diagnosis present

## 2013-10-20 DIAGNOSIS — M1990 Unspecified osteoarthritis, unspecified site: Secondary | ICD-10-CM | POA: Diagnosis present

## 2013-10-20 DIAGNOSIS — R000 Tachycardia, unspecified: Secondary | ICD-10-CM | POA: Diagnosis not present

## 2013-10-20 DIAGNOSIS — J9601 Acute respiratory failure with hypoxia: Secondary | ICD-10-CM

## 2013-10-20 DIAGNOSIS — G8929 Other chronic pain: Secondary | ICD-10-CM | POA: Diagnosis present

## 2013-10-20 DIAGNOSIS — L89309 Pressure ulcer of unspecified buttock, unspecified stage: Secondary | ICD-10-CM | POA: Diagnosis present

## 2013-10-20 DIAGNOSIS — I4891 Unspecified atrial fibrillation: Secondary | ICD-10-CM | POA: Diagnosis present

## 2013-10-20 DIAGNOSIS — J984 Other disorders of lung: Secondary | ICD-10-CM | POA: Diagnosis not present

## 2013-10-20 DIAGNOSIS — Z87891 Personal history of nicotine dependence: Secondary | ICD-10-CM

## 2013-10-20 DIAGNOSIS — J189 Pneumonia, unspecified organism: Secondary | ICD-10-CM | POA: Diagnosis present

## 2013-10-20 DIAGNOSIS — Z7401 Bed confinement status: Secondary | ICD-10-CM | POA: Diagnosis not present

## 2013-10-20 DIAGNOSIS — R1310 Dysphagia, unspecified: Secondary | ICD-10-CM | POA: Diagnosis present

## 2013-10-20 DIAGNOSIS — J9610 Chronic respiratory failure, unspecified whether with hypoxia or hypercapnia: Secondary | ICD-10-CM | POA: Diagnosis not present

## 2013-10-20 DIAGNOSIS — E662 Morbid (severe) obesity with alveolar hypoventilation: Secondary | ICD-10-CM | POA: Diagnosis present

## 2013-10-20 DIAGNOSIS — E876 Hypokalemia: Secondary | ICD-10-CM | POA: Diagnosis present

## 2013-10-20 DIAGNOSIS — R6520 Severe sepsis without septic shock: Secondary | ICD-10-CM

## 2013-10-20 DIAGNOSIS — Z515 Encounter for palliative care: Secondary | ICD-10-CM

## 2013-10-20 DIAGNOSIS — J80 Acute respiratory distress syndrome: Secondary | ICD-10-CM

## 2013-10-20 DIAGNOSIS — K219 Gastro-esophageal reflux disease without esophagitis: Secondary | ICD-10-CM | POA: Diagnosis present

## 2013-10-20 DIAGNOSIS — Z66 Do not resuscitate: Secondary | ICD-10-CM | POA: Diagnosis not present

## 2013-10-20 DIAGNOSIS — N3001 Acute cystitis with hematuria: Secondary | ICD-10-CM

## 2013-10-20 DIAGNOSIS — T17908S Unspecified foreign body in respiratory tract, part unspecified causing other injury, sequela: Secondary | ICD-10-CM

## 2013-10-20 DIAGNOSIS — J9611 Chronic respiratory failure with hypoxia: Secondary | ICD-10-CM

## 2013-10-20 DIAGNOSIS — J9620 Acute and chronic respiratory failure, unspecified whether with hypoxia or hypercapnia: Secondary | ICD-10-CM | POA: Diagnosis not present

## 2013-10-20 DIAGNOSIS — N3000 Acute cystitis without hematuria: Secondary | ICD-10-CM | POA: Diagnosis not present

## 2013-10-20 LAB — URINALYSIS, ROUTINE W REFLEX MICROSCOPIC
Bilirubin Urine: NEGATIVE
GLUCOSE, UA: NEGATIVE mg/dL
Ketones, ur: NEGATIVE mg/dL
Nitrite: NEGATIVE
PH: 5.5 (ref 5.0–8.0)
Protein, ur: 100 mg/dL — AB
SPECIFIC GRAVITY, URINE: 1.023 (ref 1.005–1.030)
UROBILINOGEN UA: 0.2 mg/dL (ref 0.0–1.0)

## 2013-10-20 LAB — COMPREHENSIVE METABOLIC PANEL
ALBUMIN: 2.7 g/dL — AB (ref 3.5–5.2)
ALK PHOS: 89 U/L (ref 39–117)
ALT: 36 U/L — AB (ref 0–35)
AST: 26 U/L (ref 0–37)
Anion gap: 15 (ref 5–15)
BILIRUBIN TOTAL: 0.3 mg/dL (ref 0.3–1.2)
BUN: 44 mg/dL — ABNORMAL HIGH (ref 6–23)
CHLORIDE: 91 meq/L — AB (ref 96–112)
CO2: 36 mEq/L — ABNORMAL HIGH (ref 19–32)
Calcium: 9.7 mg/dL (ref 8.4–10.5)
Creatinine, Ser: 0.65 mg/dL (ref 0.50–1.10)
GFR calc Af Amer: >90 mL/min (ref >90)
GFR calc non Af Amer: >90 mL/min (ref >90)
Glucose, Bld: 198 mg/dL — ABNORMAL HIGH (ref 70–99)
POTASSIUM: 4.9 meq/L (ref 3.7–5.3)
SODIUM: 142 meq/L (ref 137–147)
Total Protein: 6.5 g/dL (ref 6.0–8.3)

## 2013-10-20 LAB — URINE MICROSCOPIC-ADD ON

## 2013-10-20 LAB — CBC WITH DIFFERENTIAL/PLATELET
BASOS ABS: 0 10*3/uL (ref 0.0–0.1)
Basophils Relative: 0 % (ref 0–1)
EOS ABS: 0 10*3/uL (ref 0.0–0.7)
Eosinophils Relative: 0 % (ref 0–5)
HEMATOCRIT: 36.4 % (ref 36.0–46.0)
Hemoglobin: 11.1 g/dL — ABNORMAL LOW (ref 12.0–15.0)
LYMPHS PCT: 9 % — AB (ref 12–46)
Lymphs Abs: 1.4 10*3/uL (ref 0.7–4.0)
MCH: 30.3 pg (ref 26.0–34.0)
MCHC: 30.5 g/dL (ref 30.0–36.0)
MCV: 99.5 fL (ref 78.0–100.0)
MONOS PCT: 2 % — AB (ref 3–12)
Monocytes Absolute: 0.3 10*3/uL (ref 0.1–1.0)
NEUTROS ABS: 14.1 10*3/uL — AB (ref 1.7–7.7)
NEUTROS PCT: 89 % — AB (ref 43–77)
PLATELETS: 309 10*3/uL (ref 150–400)
RBC: 3.66 MIL/uL — ABNORMAL LOW (ref 3.87–5.11)
RDW: 15.5 % (ref 11.5–15.5)
WBC MORPHOLOGY: INCREASED
WBC: 15.8 10*3/uL — AB (ref 4.0–10.5)

## 2013-10-20 LAB — LACTIC ACID, PLASMA: LACTIC ACID, VENOUS: 3.5 mmol/L — AB (ref 0.5–2.2)

## 2013-10-20 LAB — PROCALCITONIN: Procalcitonin: 29.93 ng/mL

## 2013-10-20 LAB — I-STAT TROPONIN, ED: Troponin i, poc: 0.02 ng/mL (ref 0.00–0.08)

## 2013-10-20 LAB — GLUCOSE, CAPILLARY
Glucose-Capillary: 140 mg/dL — ABNORMAL HIGH (ref 70–99)
Glucose-Capillary: 149 mg/dL — ABNORMAL HIGH (ref 70–99)

## 2013-10-20 LAB — PRO B NATRIURETIC PEPTIDE: Pro B Natriuretic peptide (BNP): 2233 pg/mL — ABNORMAL HIGH (ref 0–125)

## 2013-10-20 LAB — CBG MONITORING, ED: GLUCOSE-CAPILLARY: 142 mg/dL — AB (ref 70–99)

## 2013-10-20 LAB — MRSA PCR SCREENING: MRSA by PCR: NEGATIVE

## 2013-10-20 MED ORDER — DOCUSATE SODIUM 50 MG/5ML PO LIQD
100.0000 mg | Freq: Two times a day (BID) | ORAL | Status: DC | PRN
Start: 2013-10-20 — End: 2013-10-26
  Filled 2013-10-20: qty 10

## 2013-10-20 MED ORDER — VANCOMYCIN HCL 10 G IV SOLR
2000.0000 mg | Freq: Once | INTRAVENOUS | Status: AC
  Administered 2013-10-20: 2000 mg via INTRAVENOUS
  Filled 2013-10-20: qty 2000

## 2013-10-20 MED ORDER — ASPIRIN 81 MG PO CHEW
324.0000 mg | CHEWABLE_TABLET | ORAL | Status: AC
  Administered 2013-10-20: 324 mg via ORAL
  Filled 2013-10-20 (×2): qty 4

## 2013-10-20 MED ORDER — INSULIN ASPART 100 UNIT/ML ~~LOC~~ SOLN
0.0000 [IU] | SUBCUTANEOUS | Status: DC
  Administered 2013-10-20 (×3): 3 [IU] via SUBCUTANEOUS
  Administered 2013-10-21: 7 [IU] via SUBCUTANEOUS
  Administered 2013-10-21: 4 [IU] via SUBCUTANEOUS
  Administered 2013-10-21: 3 [IU] via SUBCUTANEOUS
  Administered 2013-10-21 (×2): 4 [IU] via SUBCUTANEOUS
  Administered 2013-10-21: 11 [IU] via SUBCUTANEOUS
  Administered 2013-10-22: 15 [IU] via SUBCUTANEOUS
  Administered 2013-10-22 (×3): 11 [IU] via SUBCUTANEOUS
  Administered 2013-10-22: 15 [IU] via SUBCUTANEOUS
  Administered 2013-10-22: 20 [IU] via SUBCUTANEOUS
  Administered 2013-10-22 – 2013-10-23 (×4): 11 [IU] via SUBCUTANEOUS
  Administered 2013-10-23: 20 [IU] via SUBCUTANEOUS
  Administered 2013-10-23 – 2013-10-24 (×2): 11 [IU] via SUBCUTANEOUS
  Administered 2013-10-24 (×2): 7 [IU] via SUBCUTANEOUS
  Administered 2013-10-24: 11 [IU] via SUBCUTANEOUS
  Administered 2013-10-24: 4 [IU] via SUBCUTANEOUS
  Administered 2013-10-24: 17:00:00 via SUBCUTANEOUS
  Administered 2013-10-25: 5 [IU] via SUBCUTANEOUS
  Administered 2013-10-25: 7 [IU] via SUBCUTANEOUS
  Administered 2013-10-25: 4 [IU] via SUBCUTANEOUS
  Administered 2013-10-25: 7 [IU] via SUBCUTANEOUS
  Administered 2013-10-25 (×2): 4 [IU] via SUBCUTANEOUS
  Administered 2013-10-26: 15 [IU] via SUBCUTANEOUS
  Administered 2013-10-26: 11 [IU] via SUBCUTANEOUS
  Administered 2013-10-26: 7 [IU] via SUBCUTANEOUS
  Administered 2013-10-26: 15 [IU] via SUBCUTANEOUS
  Administered 2013-10-26 (×2): 11 [IU] via SUBCUTANEOUS
  Administered 2013-10-27 (×3): 15 [IU] via SUBCUTANEOUS
  Administered 2013-10-27 (×3): 11 [IU] via SUBCUTANEOUS
  Administered 2013-10-28 (×2): 20 [IU] via SUBCUTANEOUS
  Administered 2013-10-28: 15 [IU] via SUBCUTANEOUS
  Administered 2013-10-28 (×3): 20 [IU] via SUBCUTANEOUS
  Filled 2013-10-20: qty 1

## 2013-10-20 MED ORDER — ALBUTEROL SULFATE (2.5 MG/3ML) 0.083% IN NEBU
2.5000 mg | INHALATION_SOLUTION | RESPIRATORY_TRACT | Status: DC | PRN
  Administered 2013-10-25 – 2013-10-28 (×2): 2.5 mg via RESPIRATORY_TRACT
  Filled 2013-10-20 (×2): qty 3

## 2013-10-20 MED ORDER — FENTANYL CITRATE 0.05 MG/ML IJ SOLN
50.0000 ug | Freq: Once | INTRAMUSCULAR | Status: AC
  Administered 2013-10-20: 50 ug via INTRAVENOUS
  Filled 2013-10-20: qty 2

## 2013-10-20 MED ORDER — ASPIRIN 300 MG RE SUPP
300.0000 mg | RECTAL | Status: AC
  Filled 2013-10-20: qty 1

## 2013-10-20 MED ORDER — LATANOPROST 0.005 % OP SOLN
1.0000 [drp] | Freq: Every day | OPHTHALMIC | Status: DC
  Administered 2013-10-20 – 2013-10-28 (×9): 1 [drp] via OPHTHALMIC
  Filled 2013-10-20 (×2): qty 2.5

## 2013-10-20 MED ORDER — HYOSCYAMINE SULFATE 0.125 MG PO TBDP
0.1250 mg | ORAL_TABLET | Freq: Two times a day (BID) | ORAL | Status: DC
  Administered 2013-10-20 – 2013-10-28 (×18): 0.125 mg via SUBLINGUAL
  Filled 2013-10-20 (×20): qty 1

## 2013-10-20 MED ORDER — CETYLPYRIDINIUM CHLORIDE 0.05 % MT LIQD
7.0000 mL | Freq: Four times a day (QID) | OROMUCOSAL | Status: DC
  Administered 2013-10-20 – 2013-10-29 (×33): 7 mL via OROMUCOSAL

## 2013-10-20 MED ORDER — DOCUSATE SODIUM 50 MG/5ML PO LIQD
100.0000 mg | Freq: Two times a day (BID) | ORAL | Status: DC | PRN
  Filled 2013-10-20: qty 10

## 2013-10-20 MED ORDER — PRO-STAT SUGAR FREE PO LIQD
30.0000 mL | Freq: Two times a day (BID) | ORAL | Status: DC
  Administered 2013-10-20 – 2013-10-28 (×18): 30 mL
  Filled 2013-10-20 (×20): qty 30

## 2013-10-20 MED ORDER — HEPARIN SODIUM (PORCINE) 5000 UNIT/ML IJ SOLN
5000.0000 [IU] | Freq: Three times a day (TID) | INTRAMUSCULAR | Status: DC
  Administered 2013-10-20 – 2013-10-29 (×28): 5000 [IU] via SUBCUTANEOUS
  Filled 2013-10-20 (×32): qty 1

## 2013-10-20 MED ORDER — VANCOMYCIN HCL 10 G IV SOLR
1250.0000 mg | Freq: Once | INTRAVENOUS | Status: DC
  Filled 2013-10-20: qty 1250

## 2013-10-20 MED ORDER — FENTANYL CITRATE 0.05 MG/ML IJ SOLN
50.0000 ug | INTRAMUSCULAR | Status: DC | PRN
  Administered 2013-10-21 – 2013-10-22 (×2): 50 ug via INTRAVENOUS
  Filled 2013-10-20 (×2): qty 2

## 2013-10-20 MED ORDER — VANCOMYCIN HCL 10 G IV SOLR
1500.0000 mg | INTRAVENOUS | Status: DC
  Administered 2013-10-21 – 2013-10-23 (×3): 1500 mg via INTRAVENOUS
  Filled 2013-10-20 (×4): qty 1500

## 2013-10-20 MED ORDER — ADULT MULTIVITAMIN W/MINERALS CH
1.0000 | ORAL_TABLET | Freq: Every day | ORAL | Status: DC
  Administered 2013-10-21 – 2013-10-23 (×3): 1 via ORAL
  Filled 2013-10-20 (×3): qty 1

## 2013-10-20 MED ORDER — VITAL HIGH PROTEIN PO LIQD
1000.0000 mL | ORAL | Status: DC
  Administered 2013-10-21: 1000 mL
  Filled 2013-10-20 (×3): qty 1000

## 2013-10-20 MED ORDER — LEVOTHYROXINE SODIUM 50 MCG PO TABS
50.0000 ug | ORAL_TABLET | Freq: Every day | ORAL | Status: DC
  Administered 2013-10-21 – 2013-10-28 (×8): 50 ug
  Filled 2013-10-20 (×12): qty 1

## 2013-10-20 MED ORDER — FENTANYL CITRATE 0.05 MG/ML IJ SOLN
100.0000 ug | Freq: Once | INTRAMUSCULAR | Status: AC
  Administered 2013-10-20: 100 ug via INTRAVENOUS
  Filled 2013-10-20: qty 2

## 2013-10-20 MED ORDER — SODIUM CHLORIDE 0.9 % IV SOLN
INTRAVENOUS | Status: DC
  Administered 2013-10-20: 13:00:00 via INTRAVENOUS

## 2013-10-20 MED ORDER — CLONAZEPAM 0.5 MG PO TABS
0.2500 mg | ORAL_TABLET | Freq: Three times a day (TID) | ORAL | Status: DC
Start: 2013-10-20 — End: 2013-10-23
  Administered 2013-10-20 – 2013-10-23 (×9): 0.25 mg via ORAL
  Filled 2013-10-20 (×9): qty 1

## 2013-10-20 MED ORDER — FAMOTIDINE 20 MG PO TABS
20.0000 mg | ORAL_TABLET | Freq: Two times a day (BID) | ORAL | Status: DC
  Administered 2013-10-20 – 2013-10-23 (×6): 20 mg via ORAL
  Filled 2013-10-20 (×7): qty 1

## 2013-10-20 MED ORDER — ONDANSETRON HCL 4 MG/2ML IJ SOLN
4.0000 mg | Freq: Once | INTRAMUSCULAR | Status: AC
  Administered 2013-10-20: 4 mg via INTRAVENOUS
  Filled 2013-10-20: qty 2

## 2013-10-20 MED ORDER — DORZOLAMIDE HCL 2 % OP SOLN
1.0000 [drp] | Freq: Two times a day (BID) | OPHTHALMIC | Status: DC
  Administered 2013-10-20 – 2013-10-28 (×18): 1 [drp] via OPHTHALMIC
  Filled 2013-10-20 (×2): qty 10

## 2013-10-20 MED ORDER — SERTRALINE HCL 50 MG PO TABS: 50.0000 mg | ORAL_TABLET | Freq: Every day | ORAL | Status: DC

## 2013-10-20 MED ORDER — SODIUM CHLORIDE 0.9 % IV SOLN: 250.0000 mL | INTRAVENOUS | Status: DC | PRN

## 2013-10-20 MED ORDER — PIPERACILLIN-TAZOBACTAM 3.375 G IVPB
3.3750 g | Freq: Once | INTRAVENOUS | Status: AC
  Administered 2013-10-20: 3.375 g via INTRAVENOUS
  Filled 2013-10-20: qty 50

## 2013-10-20 MED ORDER — LEVOTHYROXINE SODIUM 50 MCG PO TABS
50.0000 ug | ORAL_TABLET | Freq: Every day | ORAL | Status: DC
  Administered 2013-10-20: 50 ug via ORAL
  Filled 2013-10-20 (×2): qty 1

## 2013-10-20 MED ORDER — INSULIN GLARGINE 100 UNIT/ML ~~LOC~~ SOLN
20.0000 [IU] | Freq: Every day | SUBCUTANEOUS | Status: DC
  Administered 2013-10-20 – 2013-10-21 (×2): 20 [IU] via SUBCUTANEOUS
  Filled 2013-10-20 (×3): qty 0.2

## 2013-10-20 MED ORDER — SERTRALINE HCL 50 MG PO TABS
50.0000 mg | ORAL_TABLET | Freq: Every day | ORAL | Status: DC
  Administered 2013-10-21 – 2013-10-28 (×8): 50 mg
  Filled 2013-10-20 (×10): qty 1

## 2013-10-20 MED ORDER — PIPERACILLIN-TAZOBACTAM 3.375 G IVPB
3.3750 g | Freq: Three times a day (TID) | INTRAVENOUS | Status: DC
  Administered 2013-10-20 – 2013-10-25 (×15): 3.375 g via INTRAVENOUS
  Filled 2013-10-20 (×17): qty 50

## 2013-10-20 MED ORDER — FENTANYL CITRATE 0.05 MG/ML IJ SOLN
50.0000 ug | INTRAMUSCULAR | Status: DC | PRN
  Administered 2013-10-20 – 2013-10-22 (×7): 50 ug via INTRAVENOUS
  Filled 2013-10-20 (×7): qty 2

## 2013-10-20 MED ORDER — FLUCONAZOLE 100MG IVPB
100.0000 mg | INTRAVENOUS | Status: DC
  Administered 2013-10-20 – 2013-10-23 (×4): 100 mg via INTRAVENOUS
  Filled 2013-10-20 (×4): qty 50

## 2013-10-20 MED ORDER — CHLORHEXIDINE GLUCONATE 0.12 % MT SOLN
15.0000 mL | Freq: Two times a day (BID) | OROMUCOSAL | Status: DC
  Administered 2013-10-21 – 2013-10-29 (×17): 15 mL via OROMUCOSAL
  Filled 2013-10-20 (×19): qty 15

## 2013-10-20 MED ORDER — POLYVINYL ALCOHOL 1.4 % OP SOLN
1.0000 [drp] | Freq: Two times a day (BID) | OPHTHALMIC | Status: DC
  Administered 2013-10-20 – 2013-10-28 (×18): 1 [drp] via OPHTHALMIC
  Filled 2013-10-20 (×2): qty 15

## 2013-10-20 MED ORDER — IPRATROPIUM-ALBUTEROL 0.5-2.5 (3) MG/3ML IN SOLN: 3.0000 mL | Freq: Four times a day (QID) | RESPIRATORY_TRACT | Status: DC | PRN

## 2013-10-20 MED ORDER — DEXAMETHASONE 2 MG PO TABS
2.0000 mg | ORAL_TABLET | Freq: Every day | ORAL | Status: DC
  Administered 2013-10-21 – 2013-10-23 (×3): 2 mg via ORAL
  Filled 2013-10-20 (×3): qty 1

## 2013-10-20 NOTE — ED Notes (Signed)
Attempted to call report to floor 

## 2013-10-20 NOTE — ED Notes (Signed)
Pt requesting pain medication and nausea medication. EDP informed see new orders.

## 2013-10-20 NOTE — ED Notes (Signed)
EDP aware pt is still in pain, nausea relieved.

## 2013-10-20 NOTE — ED Provider Notes (Signed)
CSN: [id number]     Arrival date & time 11/17/2013  0500 History   First MD Initiated Contact with Patient November 17, 2013 0507     Chief Complaint  Patient presents with  . Shortness of Breath  . Chest Pain     Level V caveat: Ventilatory dependent  HPI Patient brought to emergency department after complaints of increasing shortness of breath over the past 2 days.  She has a tracheostomy in place.  She is normally on trach collar during the day and ventilator at night.  She states over the past 24 hours she's been on a ventilator because of shortness of breath.  Is now reports she's been coughing more.  No reports of fever.  She has a history of aspiration before in the past.  Past Medical History  Diagnosis Date  . Fibromyalgia   . Osteoarthritis   . Asthma   . Type II or unspecified type diabetes mellitus without mention of complication, not stated as uncontrolled   . Esophageal reflux   . Allergic rhinitis, cause unspecified   . OSA on CPAP   . CAD (coronary artery disease)   . Other and unspecified hyperlipidemia    Past Surgical History  Procedure Laterality Date  . Vesicovaginal fistula closure w/ tah    . Tonsillectomy    . Eye surgery    . Tracheostomy tube placement N/A 12/15/2012    Procedure: TRACHEOSTOMY;  Surgeon: Drema Halon, MD;  Location: [facility] OR;  Service: ENT;  Laterality: N/A;  . Tee without cardioversion N/A 01/20/2013    Procedure: TRANSESOPHAGEAL ECHOCARDIOGRAM (TEE);  Surgeon: Vesta Mixer, MD;  Location: [facility] ENDOSCOPY;  Service: Cardiovascular;  Laterality: N/A;   Family History  Problem Relation Age of Onset  . Pancreatic cancer Brother   . Lung cancer Sister   . Colon cancer Maternal Grandfather   . Diabetes Father    History  Substance Use Topics  . Smoking status: Former Smoker -- 1.00 packs/day for 12 years    Types: Cigarettes    Quit date: 02/10/1992  . Smokeless tobacco: Never Used     Comment: Quit 1980s  . Alcohol Use: No   OB  History   Grav Para Term Preterm Abortions TAB SAB Ect Mult Living                 Review of Systems  Unable to perform ROS     Allergies  Codeine; Levofloxacin; Morphine; Pregabalin; Sodium benzoate; and Sulfonamide derivatives  Home Medications   Prior to Admission medications   Medication Sig Start Date End Date Taking? Authorizing Provider  clonazePAM (KLONOPIN) 0.5 MG tablet Take 0.25 mg by mouth 3 (three) times daily.    Yes Historical Provider, MD  dexamethasone (DECADRON) 2 MG tablet Take 2 mg by mouth daily.   Yes Historical Provider, MD  diazepam (VALIUM) 10 MG tablet Take 10 mg by mouth every 6 (six) hours as needed for anxiety.   Yes Historical Provider, MD  dorzolamide (TRUSOPT) 2 % ophthalmic solution Place 1 drop into both eyes 2 (two) times daily.    Yes Historical Provider, MD  famotidine (PEPCID) 20 MG tablet Take 20 mg by mouth 2 (two) times daily.   Yes Historical Provider, MD  fentaNYL (DURAGESIC - DOSED MCG/HR) 100 MCG/HR Place 1 patch (100 mcg total) onto the skin every 3 (three) days. 08/29/13  Yes Simonne Martinet, NP  ferrous sulfate 325 (65 FE) MG tablet Give 325 mg by tube daily  with breakfast.    Yes Historical Provider, MD  furosemide (LASIX) 40 MG tablet Give 40 mg by tube every 6 (six) hours as needed for edema.   Yes Historical Provider, MD  gentamicin ointment (GARAMYCIN) 0.1 % Apply 1 application topically 4 (four) times daily.   Yes Historical Provider, MD  GLUCERNA (GLUCERNA) LIQD Take 98 mLs by mouth See admin instructions. Give Glucerna 1.5 @@ 98 ml/hr x 12 hours per day   Yes Historical Provider, MD  guaiFENesin (MUCINEX) 600 MG 12 hr tablet Take 300 mg by mouth 2 (two) times daily as needed for cough or to loosen phlegm.   Yes Historical Provider, MD  guaiFENesin (ROBITUSSIN) 100 MG/5ML SOLN Take 10 mLs by mouth every 6 (six) hours as needed for cough or to loosen phlegm.   Yes Historical Provider, MD  hyoscyamine (ANASPAZ) 0.125 MG TBDP  disintergrating tablet Give 0.125 mg by tube 2 (two) times daily.    Yes Historical Provider, MD  ibuprofen (ADVIL,MOTRIN) 600 MG tablet Take 600 mg by mouth every 4 (four) hours as needed.   Yes Historical Provider, MD  insulin aspart (NOVOLOG) 100 UNIT/ML injection Inject 0-15 Units into the skin See admin instructions. Every 6 hours as needed. 70-150= 0 units; 151-200= 3 units; 201-250= 5 units; 251-300= 8 units; 301-350= 11 units; 351-400= 15 units above 400 call MD   Yes Historical Provider, MD  insulin glargine (LANTUS) 100 UNIT/ML injection Inject 45 Units into the skin 2 (two) times daily. CBG >150   Yes Historical Provider, MD  ipratropium-albuterol (DUONEB) 0.5-2.5 (3) MG/3ML SOLN Take 3 mLs by nebulization every 6 (six) hours as needed (respiratory relations).    Yes Historical Provider, MD  LACTOBACILLUS PO Take 1 tablet by mouth 2 (two) times daily.   Yes Historical Provider, MD  latanoprost (XALATAN) 0.005 % ophthalmic solution Place 1 drop into both eyes at bedtime.   Yes Historical Provider, MD  levothyroxine (SYNTHROID, LEVOTHROID) 50 MCG tablet Take 1 tablet (50 mcg total) by mouth daily before breakfast. 08/29/13  Yes Simonne Martinet, NP  Multiple Vitamin (MULTIVITAMIN WITH MINERALS) TABS tablet Take 1 tablet by mouth daily.   Yes Historical Provider, MD  oxyCODONE (OXY IR/ROXICODONE) 5 MG immediate release tablet Take 5-10 mg by mouth every 4 (four) hours as needed for severe pain. 08/29/13  Yes Simonne Martinet, NP  potassium chloride 20 MEQ/15ML (10%) solution Place 20 mEq into feeding tube daily.    Yes Historical Provider, MD  Propylene Glycol (SYSTANE BALANCE) 0.6 % SOLN Place 1 drop into both eyes 2 (two) times daily.    Yes Historical Provider, MD  senna-docusate (SENOKOT-S) 8.6-50 MG per tablet Take 2 tablets by mouth 2 (two) times daily.    Yes Historical Provider, MD  sertraline (ZOLOFT) 50 MG tablet Take 50 mg by mouth daily.   Yes Historical Provider, MD  sodium phosphate  (FLEET) 7-19 GM/118ML ENEM Place 1 enema rectally every three (3) days as needed for moderate constipation or severe constipation.   Yes Historical Provider, MD  vitamin C (ASCORBIC ACID) 500 MG tablet Take 500 mg by mouth 2 (two) times daily.   Yes Historical Provider, MD  Zinc Sulfate 220 (50 ZN) MG TABS Take 220 mg by mouth daily.   Yes Historical Provider, MD   BP 114/80  Pulse 121  Temp(Src) 99.2 F (37.3 C) (Rectal)  Resp 19  Ht 5' (1.524 m)  Wt 230 lb (104.327 kg)  BMI 44.92 kg/m2  SpO2  94% Physical Exam  Nursing note and vitals reviewed. Constitutional: She is oriented to person, place, and time. She appears well-developed and well-nourished. No distress.  HENT:  Head: Normocephalic and atraumatic.  Eyes: EOM are normal.  Neck: Normal range of motion.  Tracheostomy in place  Cardiovascular: Normal rate, regular rhythm and normal heart sounds.   Pulmonary/Chest: Effort normal and breath sounds normal.  Abdominal: Soft. She exhibits no distension. There is no tenderness.  Musculoskeletal: Normal range of motion.  Neurological: She is alert and oriented to person, place, and time.  Skin: Skin is warm and dry.  Psychiatric: She has a normal mood and affect. Judgment normal.    ED Course  Procedures (including critical care time)  CRITICAL CARE Performed by: Lyanne Co Total critical care time: 35 Critical care time was exclusive of separately billable procedures and treating other patients. Critical care was necessary to treat or prevent imminent or life-threatening deterioration. Critical care was time spent personally by me on the following activities: development of treatment plan with patient and/or surrogate as well as nursing, discussions with consultants, evaluation of patient's response to treatment, examination of patient, obtaining history from patient or surrogate, ordering and performing treatments and interventions, ordering and review of laboratory studies,  ordering and review of radiographic studies, pulse oximetry and re-evaluation of patient's condition.   Labs Review Labs Reviewed  COMPREHENSIVE METABOLIC PANEL - Abnormal; Notable for the following:    Chloride 91 (*)    CO2 36 (*)    Glucose, Bld 198 (*)    BUN 44 (*)    Albumin 2.7 (*)    ALT 36 (*)    All other components within normal limits  PRO B NATRIURETIC PEPTIDE - Abnormal; Notable for the following:    Pro B Natriuretic peptide (BNP) 2233.0 (*)    All other components within normal limits  URINALYSIS, ROUTINE W REFLEX MICROSCOPIC - Abnormal; Notable for the following:    Color, Urine AMBER (*)    APPearance TURBID (*)    Hgb urine dipstick MODERATE (*)    Protein, ur 100 (*)    Leukocytes, UA LARGE (*)    All other components within normal limits  CBC WITH DIFFERENTIAL - Abnormal; Notable for the following:    WBC 15.8 (*)    RBC 3.66 (*)    Hemoglobin 11.1 (*)    Neutrophils Relative % 89 (*)    Lymphocytes Relative 9 (*)    Monocytes Relative 2 (*)    Neutro Abs 14.1 (*)    All other components within normal limits  URINE MICROSCOPIC-ADD ON - Abnormal; Notable for the following:    Bacteria, UA FEW (*)    Casts HYALINE CASTS (*)    All other components within normal limits  I-STAT TROPOININ, ED    Imaging Review Ct Chest Wo Contrast  10/13/2013   CLINICAL DATA:  Chest pain and shortness of breath  EXAM: CT CHEST WITHOUT CONTRAST  TECHNIQUE: Multidetector CT imaging of the chest was performed following the standard protocol without IV contrast.  COMPARISON:  Plain film from earlier in the same day, 12/27/2012  FINDINGS: The right lung shows mild patchy infiltrative density particularly in the right lower lobe. A stable nodule is noted laterally in the right upper lobe. Consolidation with mild air bronchograms is noted in the left lower lobe. The left upper lobe is partially aerated with some mild consolidation as well. No parenchymal nodules are seen. The  mainstem bronchi bilaterally are  somewhat flattened. This is of uncertain etiology. No significant hilar or mediastinal adenopathy is seen. Coronary calcifications are noted. A tracheostomy to is seen in satisfactory position. Scanning into the upper abdomen reveals a patchy area of decreased attenuation in the medial segment of the left lobe of the liver. This is not well visualized on the prior exam. Nonemergent workup is recommended. The bony structures show no acute abnormality. Degenerative changes about the right shoulder joint are noted. Degenerative changes of the thoracic spine are seen.  IMPRESSION: Tracheostomy in satisfactory position.  Diffuse consolidation within the left lung involving the entire lower lobe in a portion of the upper lobe.  Patchy infiltrates on the right in the lower lobe. Some flattening of the mainstem bronchi is noted of uncertain etiology.   Electronically Signed   By: Alcide Clever M.D.   On: 11/11/2013 07:04   Dg Chest Portable 1 View  11-Nov-2013   CLINICAL DATA:  Shortness of breath and chest pain.  EXAM: PORTABLE CHEST - 1 VIEW  COMPARISON:  08/25/2013  FINDINGS: Technically limited study due to patient rotation and positioning. Tracheostomy tube. Shallow inspiration. Cardiac enlargement. Left chest opacity probably represents atelectasis or infiltration with small effusion. This is likely exacerbated by patient rotation. Right lung is grossly clear.  IMPRESSION: Rotated patient. Cardiac enlargement with probable infiltration and effusion in the left lower lung.   Electronically Signed   By: Burman Nieves M.D.   On: 11/11/2013 05:44  I personally reviewed the imaging tests through PACS system I reviewed available ER/hospitalization records through the EMR    EKG Interpretation   Date/Time:  Friday 11/11/2013 05:12:34 EDT Ventricular Rate:  116 PR Interval:  180 QRS Duration: 80 QT Interval:  315 QTC Calculation: 437 R Axis:   78 Text Interpretation:    Sinus tachycardia Borderline T wave abnormalities  ST elevation, consider inferior injury Baseline wander in lead(s) II III  aVR aVL aVF V1 V2 V3 V4 V5 V6 No significant change was found Confirmed by  Cayleigh Paull  MD, Sawyer Mentzer ([id number]) on Nov 11, 2013 7:32:54 AM      MDM   Final diagnoses:  HCAP (healthcare-associated pneumonia)    Patient presents with worsening shortness of breath.  I will maintain the patient on her home ventilator settings at this time.  She appears to have a large left-sided pneumonia.  She will be treated for healthcare associated pneumonia with vancomycin and Zosyn.  Blood cultures will be obtained.  Rectal temp is 99.2.    Lyanne Co, MD 11/11/2013 [phone]

## 2013-10-20 NOTE — ED Notes (Addendum)
PER EMS: pt from from, pts husband reports pt woke him up with complaints of sharp left sided chest pain and shortness of breath and abdominal pain. Pt has home vent. Upon EMS arrival pt was on 3.5L of O2 and saturation was at 88% and EMS bumped her up too 8L and pt at 96%. Dr. Patria Mane and respiratory tech at bedside to meet patient in room.

## 2013-10-20 NOTE — Progress Notes (Signed)
INITIAL NUTRITION ASSESSMENT  DOCUMENTATION CODES Per approved criteria  -Morbid Obesity   INTERVENTION: Initiate Vital HP formula at 15 ml/hr and increase by 10 ml every 4 hours to goal rate of 45 ml/hr with Prostat liquid protein 30 ml BID via tube to provide 1280 kcals (70% of estimated kcal needs), 124 gm protein (100% of estimated protein needs), 903 ml of free water RD to follow for nutrition care plan  NUTRITION DIAGNOSIS: Inadequate oral intake related to limited appetite, dysphagia as evidenced by need for supplemental TF via PEG tube  Goal: Enteral nutrition to provide 60-70% of estimated calorie needs (22-25 kcals/kg ideal body weight) and 100% of estimated protein needs, based on ASPEN guidelines for permissive underfeeding in critically ill obese individuals  Monitor:  TF regimen & tolerance, respiratory status, weight, labs, I/O's  Reason for Assessment: Consult  68 y.o. female  Admitting Dx: PNA, respiratory failure  ASSESSMENT: 68 y/o Female with PMH of fibromyalgia, osteoarthritis, DM II, GERD, CAD, OSA, frequent hospitalizations for respiratory failure resulting in chronic tracheostomy status requiring ATC during day and mechanical ventilation at night who presented to [facility] ER on 9/11 with a 24 hour hx of worsening shortness of breath.  As of the past week, nursing has had concerns for aspiration and have stopped feeding her orally. They have been supporting her via PEG. She was planned for a SLP swallowing evaluation but was unable to make the appointment due to current admission.   Patient is currently on ventilator support -- trach MV: 9.6 L/min Temp (24hrs), Avg:99.3 F (37.4 C), Min:98.7 F (37.1 C), Max:99.9 F (37.7 C)   Patient know to Clinical Nutrition during previous hospital admissions (July 2015); pt had FEES procedure on 08/23/13 and Dys 2, nectar thick liquid diet was recommended at that time.  RD spoke with Harvie Heck, patient's husband via telephone;  Harvie Heck reports pt was taking PO's (question consistency of solids & liquids) up until Monday, 9/7, however, intake was variable each day; pt was also receiving supplemental TF regimen of Glucerna 1.5 at 75 ml/hr via PEG tube x 12 hours (7 AM--7 PM).  TF regimen was changed this week per home health agency (as pt stopped eating) to Glucerna 1.5 formula at 98 ml/hr x 12 hours per day (7 AM--7 PM) which provides 1764 total kcals, 97 gm protein, 892 ml of free water.  Height: Ht Readings from Last 1 Encounters:  11/05/2013 5' (1.524 m)    Weight: Wt Readings from Last 1 Encounters:  10/13/2013 230 lb (104.327 kg)    Ideal Body Weight: 100 lb  % Ideal Body Weight: 230%  Wt Readings from Last 10 Encounters:  11/06/2013 230 lb (104.327 kg)  08/27/13 234 lb 5.6 oz (106.3 kg)  07/31/13 236 lb (107.049 kg)  08/29/12 317 lb (143.79 kg)  08/14/10 279 lb 9.6 oz (126.826 kg)  02/28/10 273 lb 6.1 oz (124.005 kg)  09/19/09 276 lb 4 oz (125.306 kg)  12/11/08 286 lb (129.729 kg)  12/10/08 284 lb (128.822 kg)  08/20/08 296 lb (134.265 kg)    Usual Body Weight: 234 lb  % Usual Body Weight: 98%  BMI:  Body mass index is 44.92 kg/(m^2).  Estimated Nutritional Needs: Kcal: 1807 Protein: 115-125 gm Fluid: per MD  Skin: unstageable pressure ulcer to right heel; stage 3 to left and right buttocks  Diet Order: NPO  EDUCATION NEEDS: -No education needs identified at this time  Labs:   Recent Labs Lab 10/31/2013 0558  NA 142  K 4.9  CL 91*  CO2 36*  BUN 44*  CREATININE 0.65  CALCIUM 9.7  GLUCOSE 198*    CBG (last 3)   Recent Labs  10/24/2013 1040  GLUCAP 142*    Scheduled Meds: . clonazePAM  0.25 mg Oral TID  . dexamethasone  2 mg Oral Daily  . dorzolamide  1 drop Both Eyes BID  . famotidine  20 mg Oral BID  . fluconazole (DIFLUCAN) IV  100 mg Intravenous Q24H  . heparin  5,000 Units Subcutaneous 3 times per day  . hyoscyamine  0.125 mg Sublingual BID  . insulin aspart  0-20  Units Subcutaneous 6 times per day  . insulin glargine  20 Units Subcutaneous QHS  . latanoprost  1 drop Both Eyes QHS  . levothyroxine  50 mcg Oral QAC breakfast  . multivitamin with minerals  1 tablet Oral Daily  . piperacillin-tazobactam (ZOSYN)  IV  3.375 g Intravenous 3 times per day  . polyvinyl alcohol  1 drop Both Eyes BID  . sertraline  50 mg Oral Daily  . [START ON 10/21/2013] vancomycin  1,500 mg Intravenous Q24H  . vancomycin  2,000 mg Intravenous Once    Continuous Infusions: . sodium chloride      Past Medical History  Diagnosis Date  . Fibromyalgia   . Osteoarthritis   . Asthma   . Type II or unspecified type diabetes mellitus without mention of complication, not stated as uncontrolled   . Esophageal reflux   . Allergic rhinitis, cause unspecified   . OSA on CPAP   . CAD (coronary artery disease)   . Other and unspecified hyperlipidemia     Past Surgical History  Procedure Laterality Date  . Vesicovaginal fistula closure w/ tah    . Tonsillectomy    . Eye surgery    . Tracheostomy tube placement N/A 12/15/2012    Procedure: TRACHEOSTOMY;  Surgeon: Drema Halon, MD;  Location: [facility] OR;  Service: ENT;  Laterality: N/A;  . Tee without cardioversion N/A 01/20/2013    Procedure: TRANSESOPHAGEAL ECHOCARDIOGRAM (TEE);  Surgeon: Vesta Mixer, MD;  Location: [facility] ENDOSCOPY;  Service: Cardiovascular;  Laterality: N/A;    Maureen Chatters, RD, LDN Pager #: [phone] After-Hours Pager #: [phone]

## 2013-10-20 NOTE — ED Notes (Signed)
Respiratory tech at bedside BAL suctioning to remove mucous plug from trach.

## 2013-10-20 NOTE — ED Notes (Signed)
MD at bedside. 

## 2013-10-20 NOTE — Progress Notes (Signed)
ANTIBIOTIC CONSULT NOTE - INITIAL  Pharmacy Consult for Vancomycin and Zosyn Indication: HCAP  Allergies  Allergen Reactions  . Codeine     REACTION: hallucinations  . Levofloxacin     REACTION: sores  . Morphine     REACTION: paralysis  . Pregabalin     unknown  . Sodium Benzoate     REACTION: unspecified  . Sulfonamide Derivatives     REACTION: anaphylactic    Patient Measurements: Height: 5' (152.4 cm) Weight: 230 lb (104.327 kg) IBW/kg (Calculated) : 45.5  Vital Signs: Temp: 99.2 F (37.3 C) (09/11 0719) Temp src: Rectal (09/11 0719) BP: 100/61 mmHg (09/11 0815) Pulse Rate: 120 (09/11 0830) Intake/Output from previous day:   Intake/Output from this shift:    Labs:  Recent Labs  11-09-2013 0558  WBC 15.8*  HGB 11.1*  PLT 309  CREATININE 0.65   Estimated Creatinine Clearance: 76.4 ml/min (by C-G formula based on Cr of 0.65). No results found for this basename: VANCOTROUGH, VANCOPEAK, VANCORANDOM, GENTTROUGH, GENTPEAK, GENTRANDOM, TOBRATROUGH, TOBRAPEAK, TOBRARND, AMIKACINPEAK, AMIKACINTROU, AMIKACIN,  in the last 72 hours   Microbiology: No results found for this or any previous visit (from the past 720 hour(s)).  Medical History: Past Medical History  Diagnosis Date  . Fibromyalgia   . Osteoarthritis   . Asthma   . Type II or unspecified type diabetes mellitus without mention of complication, not stated as uncontrolled   . Esophageal reflux   . Allergic rhinitis, cause unspecified   . OSA on CPAP   . CAD (coronary artery disease)   . Other and unspecified hyperlipidemia     Medications:  Anti-infectives   Start     Dose/Rate Route Frequency Ordered Stop   November 09, 2013 0915  vancomycin (VANCOCIN) 2,000 mg in sodium chloride 0.9 % 500 mL IVPB     2,000 mg 250 mL/hr over 120 Minutes Intravenous  Once 2013-11-09 0902     11/09/13 0730  vancomycin (VANCOCIN) 1,250 mg in sodium chloride 0.9 % 250 mL IVPB  Status:  Discontinued     1,250 mg 166.7 mL/hr  over 90 Minutes Intravenous  Once 2013-11-09 0722 11/09/2013 0902   November 09, 2013 0730  piperacillin-tazobactam (ZOSYN) IVPB 3.375 g     3.375 g 12.5 mL/hr over 240 Minutes Intravenous  Once 2013/11/09 1610       Assessment: 68 year old female with a tracheostomy who requires ventilator support at night.  She presents with chest pain and SOB, and is to continue Vancomycin and Zosyn for HCAP.  Goal of Therapy:  Vancomycin trough level 15-20 mcg/ml  Plan:  Vancomycin 2gm IV x 1 loading dose, then  IV q24h Zosyn 3.375gm IV q8h extended infusion Monitor renal function Follow available micro data  Estella Husk, Pharm.D., BCPS, AAHIVP Clinical Pharmacist Phone: [phone] or [phone] 11-09-2013, 9:04 AM

## 2013-10-20 NOTE — ED Notes (Signed)
EMS also reports pts husband administered 600 ibuprofen through PEG tube at 0420 this morning.

## 2013-10-20 NOTE — ED Notes (Signed)
Admitting MD at bedside.

## 2013-10-20 NOTE — Consult Note (Signed)
WOC wound consult note Reason for Consult: Consult requested for left ischium wound. Pt on vent and unable to verbalize. Wound type: Chronic stage 3 Pressure Ulcer POA: Yes Measurement:3X2X1cm Wound bed: 100% beefy red Drainage (amount, consistency, odor) Mod amt yellow drainage, no odor Periwound: Intact skin surrounding. Dressing procedure/placement/frequency: Aquacel to absorb drainage and provide antimicrobial benefits. Please re-consult if further assistance is needed.  Thank-you,  Cammie Mcgee MSN, RN, CWOCN, [place], CNS [phone]

## 2013-10-20 NOTE — ED Notes (Signed)
Dr. Campos at bedside   

## 2013-10-20 NOTE — H&P (Signed)
PULMONARY / CRITICAL CARE MEDICINE   Name: [name] MRN: [id number] DOB: 02-Jul-1945    ADMISSION DATE:  10/15/2013  REFERRING MD :  EDP  CHIEF COMPLAINT:  PNA, Respiratory Failure   INITIAL PRESENTATION: 68 y/o F, chronic trach/vent patient, admitted to [facility] 9/11 for L PNA.  Baseline ATC during day, vent QHS.    STUDIES:  9/11  CT Chest > trach in good position, diffuse consolidation on L, patchy infiltrates RLL, Flattening of main stem bronchi noted  SIGNIFICANT EVENTS: 9/11  Admit with L HCAP, UTI   HISTORY OF PRESENT ILLNESS:  68 y/o F with PMH of fibromyalgia, osteoarthritis, DM II, GERD, CAD, OSA, frequent hospitalizations for respiratory failure resulting in chronic tracheostomy status requiring ATC during day and mechanical ventilation at night who presented to [facility] ER on 9/11 with a 24 hour hx of worsening shortness of breath.  SOB was increased to the point that she had to use the ventilator over the past 24 hours.  She endorses sinus pressure since 9/7.  Husband reports she was in her usual state of health the day prior to admission.  He states she woke him in the middle of the night asking for suctioning which is unusual.  He reports creamy white secretions returned.  The patient has 16 hours of nursing during weekdays and 12 hours of home nursing care on the weekend.  As of the past week, nursing has had concerns for aspiration and have stopped feeding her orally.  They have been supporting her via PEG.  She was planned for a SLP swallowing evaluation but was unable to make the appointment due to current admission.  At baseline, she has been bed bound since Oct of 2014 with noted foot drop.  Husband reports her mobility has been limited by L hip wound, generalized weakness and mechanical ventilation.    See ROS for current c/o's.     PAST MEDICAL HISTORY :  Past Medical History  Diagnosis Date  . Fibromyalgia   . Osteoarthritis   . Asthma   . Type II or unspecified type  diabetes mellitus without mention of complication, not stated as uncontrolled   . Esophageal reflux   . Allergic rhinitis, cause unspecified   . OSA on CPAP   . CAD (coronary artery disease)   . Other and unspecified hyperlipidemia    Past Surgical History  Procedure Laterality Date  . Vesicovaginal fistula closure w/ tah    . Tonsillectomy    . Eye surgery    . Tracheostomy tube placement N/A 12/15/2012    Procedure: TRACHEOSTOMY;  Surgeon: Drema Halon, MD;  Location: [facility] OR;  Service: ENT;  Laterality: N/A;  . Tee without cardioversion N/A 01/20/2013    Procedure: TRANSESOPHAGEAL ECHOCARDIOGRAM (TEE);  Surgeon: Vesta Mixer, MD;  Location: [facility] ENDOSCOPY;  Service: Cardiovascular;  Laterality: N/A;   Prior to Admission medications   Medication Sig Start Date End Date Taking? Authorizing Provider  clonazePAM (KLONOPIN) 0.5 MG tablet Take 0.25 mg by mouth 3 (three) times daily.    Yes Historical Provider, MD  dexamethasone (DECADRON) 2 MG tablet Take 2 mg by mouth daily.   Yes Historical Provider, MD  diazepam (VALIUM) 10 MG tablet Take 10 mg by mouth every 6 (six) hours as needed for anxiety.   Yes Historical Provider, MD  dorzolamide (TRUSOPT) 2 % ophthalmic solution Place 1 drop into both eyes 2 (two) times daily.    Yes Historical Provider, MD  famotidine (PEPCID)  20 MG tablet Take 20 mg by mouth 2 (two) times daily.   Yes Historical Provider, MD  fentaNYL (DURAGESIC - DOSED MCG/HR) 100 MCG/HR Place 1 patch (100 mcg total) onto the skin every 3 (three) days. 08/29/13  Yes Simonne Martinet, NP  ferrous sulfate 325 (65 FE) MG tablet Give 325 mg by tube daily with breakfast.    Yes Historical Provider, MD  furosemide (LASIX) 40 MG tablet Give 40 mg by tube every 6 (six) hours as needed for edema.   Yes Historical Provider, MD  gentamicin ointment (GARAMYCIN) 0.1 % Apply 1 application topically 4 (four) times daily.   Yes Historical Provider, MD  GLUCERNA (GLUCERNA) LIQD Take 98 mLs  by mouth See admin instructions. Give Glucerna 1.5 @@ 98 ml/hr x 12 hours per day   Yes Historical Provider, MD  guaiFENesin (MUCINEX) 600 MG 12 hr tablet Take 300 mg by mouth 2 (two) times daily as needed for cough or to loosen phlegm.   Yes Historical Provider, MD  guaiFENesin (ROBITUSSIN) 100 MG/5ML SOLN Take 10 mLs by mouth every 6 (six) hours as needed for cough or to loosen phlegm.   Yes Historical Provider, MD  hyoscyamine (ANASPAZ) 0.125 MG TBDP disintergrating tablet Give 0.125 mg by tube 2 (two) times daily.    Yes Historical Provider, MD  ibuprofen (ADVIL,MOTRIN) 600 MG tablet Take 600 mg by mouth every 4 (four) hours as needed.   Yes Historical Provider, MD  insulin aspart (NOVOLOG) 100 UNIT/ML injection Inject 0-15 Units into the skin See admin instructions. Every 6 hours as needed. 70-150= 0 units; 151-200= 3 units; 201-250= 5 units; 251-300= 8 units; 301-350= 11 units; 351-400= 15 units above 400 call MD   Yes Historical Provider, MD  insulin glargine (LANTUS) 100 UNIT/ML injection Inject 45 Units into the skin 2 (two) times daily. CBG >150   Yes Historical Provider, MD  ipratropium-albuterol (DUONEB) 0.5-2.5 (3) MG/3ML SOLN Take 3 mLs by nebulization every 6 (six) hours as needed (respiratory relations).    Yes Historical Provider, MD  LACTOBACILLUS PO Take 1 tablet by mouth 2 (two) times daily.   Yes Historical Provider, MD  latanoprost (XALATAN) 0.005 % ophthalmic solution Place 1 drop into both eyes at bedtime.   Yes Historical Provider, MD  levothyroxine (SYNTHROID, LEVOTHROID) 50 MCG tablet Take 1 tablet (50 mcg total) by mouth daily before breakfast. 08/29/13  Yes Simonne Martinet, NP  Multiple Vitamin (MULTIVITAMIN WITH MINERALS) TABS tablet Take 1 tablet by mouth daily.   Yes Historical Provider, MD  oxyCODONE (OXY IR/ROXICODONE) 5 MG immediate release tablet Take 5-10 mg by mouth every 4 (four) hours as needed for severe pain. 08/29/13  Yes Simonne Martinet, NP  potassium chloride  20 MEQ/15ML (10%) solution Place 20 mEq into feeding tube daily.    Yes Historical Provider, MD  Propylene Glycol (SYSTANE BALANCE) 0.6 % SOLN Place 1 drop into both eyes 2 (two) times daily.    Yes Historical Provider, MD  senna-docusate (SENOKOT-S) 8.6-50 MG per tablet Take 2 tablets by mouth 2 (two) times daily.    Yes Historical Provider, MD  sertraline (ZOLOFT) 50 MG tablet Take 50 mg by mouth daily.   Yes Historical Provider, MD  sodium phosphate (FLEET) 7-19 GM/118ML ENEM Place 1 enema rectally every three (3) days as needed for moderate constipation or severe constipation.   Yes Historical Provider, MD  vitamin C (ASCORBIC ACID) 500 MG tablet Take 500 mg by mouth 2 (two) times daily.  Yes Historical Provider, MD  Zinc Sulfate 220 (50 ZN) MG TABS Take 220 mg by mouth daily.   Yes Historical Provider, MD   Allergies  Allergen Reactions  . Codeine     REACTION: hallucinations  . Levofloxacin     REACTION: sores  . Morphine     REACTION: paralysis  . Pregabalin     unknown  . Sodium Benzoate     REACTION: unspecified  . Sulfonamide Derivatives     REACTION: anaphylactic    FAMILY HISTORY:  Family History  Problem Relation Age of Onset  . Pancreatic cancer Brother   . Lung cancer Sister   . Colon cancer Maternal Grandfather   . Diabetes Father    SOCIAL HISTORY:  reports that she quit smoking about 21 years ago. Her smoking use included Cigarettes. She has a 12 pack-year smoking history. She has never used smokeless tobacco. She reports that she does not drink alcohol. Her drug history is not on file.  REVIEW OF SYSTEMS:   Gen: Denies fever, chills, weight change, fatigue, night sweats HEENT: Denies blurred vision, double vision, hearing loss, tinnitus, sinus congestion, rhinorrhea, sore throat, neck stiffness, dysphagia PULM: Denies hemoptysis, wheezing. Reports shortness of breath, cough, sputum production CV: Denies chest pain, edema, orthopnea, paroxysmal nocturnal  dyspnea, palpitations GI: Denies abdominal pain, nausea, vomiting, diarrhea, hematochezia, melena, constipation, change in bowel habits GU: Denies dysuria, hematuria, polyuria, oliguria, urethral discharge Endocrine: Denies hot or cold intolerance, polyuria, polyphagia or appetite change Derm: Denies rash, dry skin, scaling or peeling skin change.  Reports thin skin, easily tears Heme: Denies easy bruising, bleeding, bleeding gums Neuro: Denies headache, numbness, weakness, slurred speech, loss of memory or consciousness.  Reports generalized pain, discomfort (chronic)   SUBJECTIVE:   VITAL SIGNS: Temp:  [98.7 F (37.1 C)-99.2 F (37.3 C)] 99.2 F (37.3 C) (09/11 0719) Pulse Rate:  [95-121] 118 (09/11 0915) Resp:  [19-29] 20 (09/11 0915) BP: (100-128)/(46-94) 115/65 mmHg (09/11 0915) SpO2:  [88 %-96 %] 96 % (09/11 0915) FiO2 (%):  [50 %] 50 % (09/11 0735) Weight:  [230 lb (104.327 kg)] 230 lb (104.327 kg) (09/11 0510)  HEMODYNAMICS:    VENTILATOR SETTINGS: Vent Mode:  [-] PRVC FiO2 (%):  [50 %] 50 % Set Rate:  [6 bmp-10 bmp] 10 bmp Vt Set:  [500 mL] 500 mL PEEP:  [5 cmH20] 5 cmH20 Pressure Support:  [12 cmH20] 12 cmH20 Plateau Pressure:  [24 cmH20] 24 cmH20  INTAKE / OUTPUT: No intake or output data in the 24 hours ending 10/12/2013 0959  PHYSICAL EXAMINATION: General:  Chronically ill adult female in NAD Neuro:  Awake, alert, generalized weakness > LE's than upper  HEENT:  Mm pink/moist, poor dentition, #6 XLT trach midline c/d/i Cardiovascular:  s1s2 rrr, no m/r/g Lungs:  resp's even/non-labored on vent, lungs bilaterally diminished L>R Abdomen:  Round/soft, bsx4 active, PEG c/d/i Musculoskeletal:  No acute deformities, foot drop noted Skin:  Warm/dry, thin skin, multiple bruises / tears to upper extremities   LABS:  CBC  Recent Labs Lab 10/11/2013 0558  WBC 15.8*  HGB 11.1*  HCT 36.4  PLT 309   Coag's  No results found for this basename: APTT, INR,  in the  last 168 hours  BMET  Recent Labs Lab 10/21/2013 0558  NA 142  K 4.9  CL 91*  CO2 36*  BUN 44*  CREATININE 0.65  GLUCOSE 198*   Electrolytes  Recent Labs Lab 10/10/2013 0558  CALCIUM 9.7  Sepsis Markers No results found for this basename: LATICACIDVEN, PROCALCITON, O2SATVEN,  in the last 168 hours  ABG No results found for this basename: PHART, PCO2ART, PO2ART,  in the last 168 hours  Liver Enzymes  Recent Labs Lab 10/10/2013 0558  AST 26  ALT 36*  ALKPHOS 89  BILITOT 0.3  ALBUMIN 2.7*   Cardiac Enzymes  Recent Labs Lab 11/02/2013 0558  PROBNP 2233.0*   Glucose No results found for this basename: GLUCAP,  in the last 168 hours  Imaging No results found.   ASSESSMENT / PLAN:  PULMONARY Chronic Trach #6 XLT A: Acute on Chronic Respiratory Failure HCAP OSA P:   Full vent support, 8 cc/kg [facility] for SDU admission, stable vent Daily ATC, nocturnal vent starting 9/12 See ID [id number] PRN albuterol    CARDIOVASCULAR CVL n/a A:  CAD HLD P:  SDU admit, tele monitoring Trend BNP  RENAL A:   No acute issues  P:   Will need to address home PRN lasix dosing (Q6 PRN) at discharge Monitor BMP Replace electrolytes as indicated   GASTROINTESTINAL A:   GERD Protein Calorie Malnutrition  Dysphagia - had been eating at home until a week ago P:   Continue pepcid (home med) SLP evaluation for swallowing  HEMATOLOGIC A:   Anemia - likely of chronic disease  P:  Trend CBC DVT: Heparin SQ  INFECTIOUS A:   HCAP - L diffuse infiltrate, mild R lower, concern for aspiration  Yeast UTI  P:   BCx2 9/11 >> UC 9/11 >> Sputum 9/11 >> Legionella 9/11 >> U. Strep 9/11 >>   Abx: Vanco, start date 9/11, day 1/x Zosyn, start date 9/11, day 1/x Diflucan, start date 9/11, day 1/x  ENDOCRINE A:   DM   Hypothyroidism  P:   Continue Synthroid SSI, resistant scale Reduced dose lantus, 20 units QHS  NEUROLOGIC A:   Chronic Pain /  Fibromyalgia Osteoarthritis  P:   RASS goal: n/a Continue home klonopin, zoloft Hold home fentanyl patch PRN Fentanyl for pain   TODAY'S SUMMARY: 68 y/o chronic trach (ATC daytime, vent QHS) admitted to SDU with L HCAP & Yeast UTI.  Canary Brim, NP-C [facility] Pgr: [phone] or [phone]    I have personally obtained a history, examined the patient, evaluated laboratory and imaging results, formulated the assessment and plan and placed orders.  CRITICAL CARE: The patient is critically ill with multiple organ systems failure and requires high complexity decision making for assessment and support, frequent evaluation and titration of therapies, application of advanced monitoring technologies and extensive interpretation of multiple databases. Critical Care Time devoted to patient care services described in this note is 50 minutes.   Oretha Milch MD  11/02/2013, 9:59 AM

## 2013-10-21 ENCOUNTER — Inpatient Hospital Stay (HOSPITAL_COMMUNITY): Payer: Medicare Other

## 2013-10-21 LAB — CBC
HCT: 32.9 % — ABNORMAL LOW (ref 36.0–46.0)
Hemoglobin: 10 g/dL — ABNORMAL LOW (ref 12.0–15.0)
MCH: 29.4 pg (ref 26.0–34.0)
MCHC: 30.4 g/dL (ref 30.0–36.0)
MCV: 96.8 fL (ref 78.0–100.0)
PLATELETS: 264 10*3/uL (ref 150–400)
RBC: 3.4 MIL/uL — AB (ref 3.87–5.11)
RDW: 15.8 % — ABNORMAL HIGH (ref 11.5–15.5)
WBC: 10.9 10*3/uL — ABNORMAL HIGH (ref 4.0–10.5)

## 2013-10-21 LAB — GLUCOSE, CAPILLARY
GLUCOSE-CAPILLARY: 171 mg/dL — AB (ref 70–99)
GLUCOSE-CAPILLARY: 144 mg/dL — AB (ref 70–99)
GLUCOSE-CAPILLARY: 153 mg/dL — AB (ref 70–99)
GLUCOSE-CAPILLARY: 265 mg/dL — AB (ref 70–99)
Glucose-Capillary: 169 mg/dL — ABNORMAL HIGH (ref 70–99)
Glucose-Capillary: 234 mg/dL — ABNORMAL HIGH (ref 70–99)

## 2013-10-21 LAB — BASIC METABOLIC PANEL
ANION GAP: 10 (ref 5–15)
BUN: 55 mg/dL — ABNORMAL HIGH (ref 6–23)
CO2: 34 meq/L — AB (ref 19–32)
CREATININE: 0.67 mg/dL (ref 0.50–1.10)
Calcium: 8.9 mg/dL (ref 8.4–10.5)
Chloride: 97 mEq/L (ref 96–112)
GFR calc Af Amer: >90 mL/min (ref >90)
GFR calc non Af Amer: >90 mL/min (ref >90)
GLUCOSE: 157 mg/dL — AB (ref 70–99)
Potassium: 3.9 mEq/L (ref 3.7–5.3)
SODIUM: 141 meq/L (ref 137–147)

## 2013-10-21 LAB — PRO B NATRIURETIC PEPTIDE: Pro B Natriuretic peptide (BNP): 2401 pg/mL — ABNORMAL HIGH (ref 0–125)

## 2013-10-21 LAB — PROCALCITONIN: PROCALCITONIN: 33.75 ng/mL

## 2013-10-21 MED ORDER — VITAL HIGH PROTEIN PO LIQD
1000.0000 mL | ORAL | Status: DC
  Administered 2013-10-22 – 2013-10-28 (×8): 1000 mL
  Filled 2013-10-21 (×12): qty 1000

## 2013-10-21 NOTE — Progress Notes (Signed)
PULMONARY / CRITICAL CARE MEDICINE   Name: [name] MRN: [id number] DOB: 09-18-1945    ADMISSION DATE:  10/24/2013  REFERRING MD :  EDP  CHIEF COMPLAINT:  PNA, Respiratory Failure   INITIAL PRESENTATION: 68 y/o F, chronic trach/vent patient, admitted to [facility] 9/11 for L PNA.  Baseline ATC during day, vent QHS.    STUDIES:  9/11  CT Chest > trach in good position, diffuse consolidation on L, patchy infiltrates RLL, Flattening of main stem bronchi noted  SIGNIFICANT EVENTS: 9/11  Admit with L HCAP, UTI     SUBJECTIVE:  nad on vent  VITAL SIGNS: Temp:  [98.4 F (36.9 C)-102.9 F (39.4 C)] 98.4 F (36.9 C) (09/12 0824) Pulse Rate:  [110-125] 112 (09/12 0824) Resp:  [17-24] 19 (09/12 0700) BP: (97-120)/(49-61) 119/61 mmHg (09/12 0824) SpO2:  [93 %-97 %] 96 % (09/12 0824) FiO2 (%):  [40 %] 40 % (09/12 0824) Weight:  [250 lb 7.1 oz (113.6 kg)] 250 lb 7.1 oz (113.6 kg) (09/12 0500)  HEMODYNAMICS:    VENTILATOR SETTINGS: Vent Mode:  [-] PRVC FiO2 (%):  [40 %] 40 % Set Rate:  [10 bmp] 10 bmp Vt Set:  [500 mL] 500 mL PEEP:  [5 cmH20] 5 cmH20 Plateau Pressure:  [31 cmH20-40 cmH20] 40 cmH20  INTAKE / OUTPUT:  Intake/Output Summary (Last 24 hours) at 10/21/13 1137 Last data filed at 10/21/13 0407  Gross per 24 hour  Intake 643.75 ml  Output    525 ml  Net 118.75 ml    PHYSICAL EXAMINATION: General:  Chronically ill adult female in NAD Neuro:  Awake, alert, generalized weakness > LE's than upper  HEENT:  Mm pink/moist, poor dentition, #6 XLT trach midline c/d/i Cardiovascular:  s1s2 rrr, no m/r/g Lungs:  resp's even/non-labored on vent, lungs bilaterally diminished L>R Abdomen:  Round/soft, bsx4 active, PEG site fine Musculoskeletal:  No acute deformities, foot drop noted Skin:  Warm/dry, thin skin, multiple bruises / tears to upper extremities   LABS:  CBC  Recent Labs Lab 10/12/2013 0558 10/21/13 0525  WBC 15.8* 10.9*  HGB 11.1* 10.0*  HCT 36.4 32.9*   PLT 309 264   Coag's  No results found for this basename: APTT, INR,  in the last 168 hours  BMET  Recent Labs Lab 10/15/2013 0558 10/21/13 0525  NA 142 141  K 4.9 3.9  CL 91* 97  CO2 36* 34*  BUN 44* 55*  CREATININE 0.65 0.67  GLUCOSE 198* 157*   Electrolytes  Recent Labs Lab 11/06/2013 0558 10/21/13 0525  CALCIUM 9.7 8.9   Sepsis Markers  Recent Labs Lab 11/08/2013 1224 10/21/13 0525  LATICACIDVEN 3.5*  --   PROCALCITON 29.93 33.75    ABG No results found for this basename: PHART, PCO2ART, PO2ART,  in the last 168 hours  Liver Enzymes  Recent Labs Lab 10/28/2013 0558  AST 26  ALT 36*  ALKPHOS 89  BILITOT 0.3  ALBUMIN 2.7*   Cardiac Enzymes  Recent Labs Lab 10/21/2013 0558 10/21/13 0525  PROBNP 2233.0* 2401.0*   Glucose  Recent Labs Lab 10/22/2013 1040 10/28/2013 1559 10/24/2013 2009 10/14/2013 2336 10/21/13 0405 10/21/13 0738  GLUCAP 142* 140* 149* 169* 171* 144*    Imaging Ct Chest Wo Contrast  10/19/2013   CLINICAL DATA:  Chest pain and shortness of breath  EXAM: CT CHEST WITHOUT CONTRAST  TECHNIQUE: Multidetector CT imaging of the chest was performed following the standard protocol without IV contrast.  COMPARISON:  Plain film from  earlier in the same day, 12/27/2012  FINDINGS: The right lung shows mild patchy infiltrative density particularly in the right lower lobe. A stable nodule is noted laterally in the right upper lobe. Consolidation with mild air bronchograms is noted in the left lower lobe. The left upper lobe is partially aerated with some mild consolidation as well. No parenchymal nodules are seen. The mainstem bronchi bilaterally are somewhat flattened. This is of uncertain etiology. No significant hilar or mediastinal adenopathy is seen. Coronary calcifications are noted. A tracheostomy to is seen in satisfactory position. Scanning into the upper abdomen reveals a patchy area of decreased attenuation in the medial segment of the left lobe of  the liver. This is not well visualized on the prior exam. Nonemergent workup is recommended. The bony structures show no acute abnormality. Degenerative changes about the right shoulder joint are noted. Degenerative changes of the thoracic spine are seen.  IMPRESSION: Tracheostomy in satisfactory position.  Diffuse consolidation within the left lung involving the entire lower lobe in a portion of the upper lobe.  Patchy infiltrates on the right in the lower lobe. Some flattening of the mainstem bronchi is noted of uncertain etiology.   Electronically Signed   By: Alcide Clever M.D.   On: 10/27/2013 07:04   Dg Chest Portable 1 View  10/10/2013   CLINICAL DATA:  Shortness of breath and chest pain.  EXAM: PORTABLE CHEST - 1 VIEW  COMPARISON:  08/25/2013  FINDINGS: Technically limited study due to patient rotation and positioning. Tracheostomy tube. Shallow inspiration. Cardiac enlargement. Left chest opacity probably represents atelectasis or infiltration with small effusion. This is likely exacerbated by patient rotation. Right lung is grossly clear.  IMPRESSION: Rotated patient. Cardiac enlargement with probable infiltration and effusion in the left lower lung.   Electronically Signed   By: Burman Nieves M.D.   On: 11/06/2013 05:44     ASSESSMENT / PLAN:  PULMONARY Chronic Trach #6 XLT A: Acute on Chronic Respiratory Failure HCAP OSA P:   Full vent support, 8 cc/kg> continue  Daily ATC, nocturnal vent as tolerates    PRN albuterol    CARDIOVASCULAR CVL n/a A:  CAD HLD P:    Trend BNP  RENAL A:   No acute issues  P:   Will need to address home PRN lasix dosing (Q6 PRN) at discharge Monitor BMP Replace electrolytes as indicated   GASTROINTESTINAL A:   GERD Protein Calorie Malnutrition  Dysphagia - had been eating at home until a week ago P:   Continue pepcid (home med) SLP evaluation for swallowing when able   HEMATOLOGIC Lab Results  Component Value Date   HGB 10.0*  10/21/2013   HGB 11.1* 10/28/2013   HGB 9.9* 08/25/2013    A:   Anemia - likely of chronic disease  History of Present Illness   P:  Trend CBC DVT: Heparin SQ  INFECTIOUS - procalictonin 34 on 10/21/13  A:   HCAP - L diffuse infiltrate, mild R lower, concern for aspiration  Yeast UTI  P:   BCx2 9/11 >> UC 9/11 >> Sputum 9/11 > abun gnr >>> Legionella 9/11 >> U. Strep 9/11 >>   Abx: Vanco 9/11 >>> Zosyn, 9/11 >>> Diflucan 9/11 >>>  ENDOCRINE A:   DM   Hypothyroidism  P:   Continue Synthroid SSI, resistant scale Reduced dose lantus, 20 units QHS  NEUROLOGIC A:   Chronic Pain / Fibromyalgia Osteoarthritis  P:   RASS goal: n/a Continue home klonopin, zoloft  Hold home fentanyl patch PRN Fentanyl for pain      Summary:   Chronic vent dep pt with apparent HCAP on approp empirical rx    Sandrea Hughs, MD Pulmonary and Critical Care Medicine [facility] Cell [phone] After 5:30 PM or weekends, call [phone]

## 2013-10-21 NOTE — Progress Notes (Addendum)
SLP Cancellation Note  Patient Details Name: DAURICE OVANDO MRN: [id number] DOB: 1945/08/14   Cancelled treatment:       Reason Eval/Treat Not Completed: Medical issues which prohibited therapy. Pt still on ventilator, has PEG for nutrition. Will follow for readiness for swallow eval. Of note, pt was scheduled for an outpatient modified Barium swallow later in September. This pt is not able to participate in MBS due to body habitus and should not be scheduled for any swallow testing at [facility]. She can only have a FEES (fiberoptic endoscopic evaluation of swallowing) and should only come to [facility] for swallow testing where that equipment is available. Will follow for readiness for FEES this admit.    Andrus Sharp, Riley Nearing 10/21/2013, 8:56 AM

## 2013-10-22 ENCOUNTER — Inpatient Hospital Stay (HOSPITAL_COMMUNITY): Payer: Medicare Other

## 2013-10-22 DIAGNOSIS — Z66 Do not resuscitate: Secondary | ICD-10-CM | POA: Diagnosis not present

## 2013-10-22 DIAGNOSIS — Z515 Encounter for palliative care: Secondary | ICD-10-CM

## 2013-10-22 LAB — URINE CULTURE: Colony Count: >=100000

## 2013-10-22 LAB — PROCALCITONIN
PROCALCITONIN: 16.07 ng/mL
Procalcitonin: 14.23 ng/mL

## 2013-10-22 LAB — BASIC METABOLIC PANEL
Anion gap: 10 (ref 5–15)
BUN: 42 mg/dL — AB (ref 6–23)
CHLORIDE: 98 meq/L (ref 96–112)
CO2: 34 meq/L — AB (ref 19–32)
CREATININE: 0.45 mg/dL — AB (ref 0.50–1.10)
Calcium: 9.1 mg/dL (ref 8.4–10.5)
GFR calc non Af Amer: >90 mL/min (ref >90)
Glucose, Bld: 276 mg/dL — ABNORMAL HIGH (ref 70–99)
POTASSIUM: 2.9 meq/L — AB (ref 3.7–5.3)
Sodium: 142 mEq/L (ref 137–147)

## 2013-10-22 LAB — BLOOD GAS, ARTERIAL
ACID-BASE EXCESS: 3.7 mmol/L — AB (ref 0.0–2.0)
ACID-BASE EXCESS: 2.8 mmol/L — AB (ref 0.0–2.0)
Acid-Base Excess: 7.6 mmol/L — ABNORMAL HIGH (ref 0.0–2.0)
Acid-Base Excess: 7.7 mmol/L — ABNORMAL HIGH (ref 0.0–2.0)
Bicarbonate: 32.7 mEq/L — ABNORMAL HIGH (ref 20.0–24.0)
Bicarbonate: 34.9 mEq/L — ABNORMAL HIGH (ref 20.0–24.0)
Bicarbonate: 32.2 mEq/L — ABNORMAL HIGH (ref 20.0–24.0)
Bicarbonate: 30.8 mEq/L — ABNORMAL HIGH (ref 20.0–24.0)
DRAWN BY: 23604
DRAWN BY: 20517
Drawn by: 24610
Drawn by: 24610
FIO2: 1 %
FIO2: 1 %
FIO2: 1 %
FIO2: 1 %
LHR: 10 {breaths}/min
LHR: 30 {breaths}/min
LHR: 30 {breaths}/min
MECHVT: 380 mL
MECHVT: 320 mL
O2 SAT: 83.4 %
O2 SAT: 89.8 %
O2 Saturation: 75.8 %
O2 Saturation: 94 %
PCO2 ART: 56.5 mmHg — AB (ref 35.0–45.0)
PCO2 ART: 97.4 mmHg — AB (ref 35.0–45.0)
PCO2 ART: 89.6 mmHg — AB (ref 35.0–45.0)
PEEP/CPAP: 8 cmH2O
PEEP/CPAP: 14 cmH2O
PEEP: 20 cmH2O
PEEP: 20 cmH2O
PO2 ART: 51 mmHg — AB (ref 80.0–100.0)
PO2 ART: 50.2 mmHg — AB (ref 80.0–100.0)
PO2 ART: 87.1 mmHg (ref 80.0–100.0)
Patient temperature: 98.6
Patient temperature: 98.6
Patient temperature: 98.6
Patient temperature: 99.1
RATE: 35 resp/min
TCO2: 34.4 mmol/L (ref 0–100)
TCO2: 37.4 mmol/L (ref 0–100)
TCO2: 35.2 mmol/L (ref 0–100)
TCO2: 33.5 mmol/L (ref 0–100)
VT: 500 mL
VT: 320 mL
pCO2 arterial: 84.3 mmHg (ref 35.0–45.0)
pH, Arterial: 7.38 (ref 7.350–7.450)
pH, Arterial: 7.24 — ABNORMAL LOW (ref 7.350–7.450)
pH, Arterial: 7.146 — CL (ref 7.350–7.450)
pH, Arterial: 7.164 — CL (ref 7.350–7.450)
pO2, Arterial: 73.9 mmHg — ABNORMAL LOW (ref 80.0–100.0)

## 2013-10-22 LAB — GLUCOSE, CAPILLARY
GLUCOSE-CAPILLARY: 271 mg/dL — AB (ref 70–99)
GLUCOSE-CAPILLARY: 355 mg/dL — AB (ref 70–99)
Glucose-Capillary: 265 mg/dL — ABNORMAL HIGH (ref 70–99)
Glucose-Capillary: 271 mg/dL — ABNORMAL HIGH (ref 70–99)
Glucose-Capillary: 282 mg/dL — ABNORMAL HIGH (ref 70–99)
Glucose-Capillary: 307 mg/dL — ABNORMAL HIGH (ref 70–99)

## 2013-10-22 LAB — CULTURE, BLOOD (ROUTINE X 2)

## 2013-10-22 LAB — POCT I-STAT 3, ART BLOOD GAS (G3+)
ACID-BASE EXCESS: 5 mmol/L — AB (ref 0.0–2.0)
Acid-Base Excess: 6 mmol/L — ABNORMAL HIGH (ref 0.0–2.0)
Bicarbonate: 36.4 mEq/L — ABNORMAL HIGH (ref 20.0–24.0)
Bicarbonate: 34.6 mEq/L — ABNORMAL HIGH (ref 20.0–24.0)
O2 SAT: 83 %
O2 Saturation: 97 %
PCO2 ART: 87.5 mmHg — AB (ref 35.0–45.0)
PH ART: 7.229 — AB (ref 7.350–7.450)
PO2 ART: 61 mmHg — AB (ref 80.0–100.0)
Patient temperature: 99.2
Patient temperature: 97.7
TCO2: 39 mmol/L (ref 0–100)
TCO2: 37 mmol/L (ref 0–100)
pCO2 arterial: 80.5 mmHg (ref 35.0–45.0)
pH, Arterial: 7.239 — ABNORMAL LOW (ref 7.350–7.450)
pO2, Arterial: 112 mmHg — ABNORMAL HIGH (ref 80.0–100.0)

## 2013-10-22 MED ORDER — SODIUM CHLORIDE 0.9 % IV SOLN
0.0000 ug/h | INTRAVENOUS | Status: DC
  Administered 2013-10-22: 50 ug/h via INTRAVENOUS
  Administered 2013-10-23: 160 ug/h via INTRAVENOUS
  Administered 2013-10-24: 180 ug/h via INTRAVENOUS
  Administered 2013-10-24: 150 ug/h via INTRAVENOUS
  Administered 2013-10-25 – 2013-10-29 (×4): 100 ug/h via INTRAVENOUS
  Filled 2013-10-22 (×8): qty 50

## 2013-10-22 MED ORDER — INSULIN GLARGINE 100 UNIT/ML ~~LOC~~ SOLN
30.0000 [IU] | Freq: Every day | SUBCUTANEOUS | Status: DC
  Administered 2013-10-22 – 2013-10-23 (×2): 30 [IU] via SUBCUTANEOUS
  Filled 2013-10-22 (×3): qty 0.3

## 2013-10-22 MED ORDER — SODIUM BICARBONATE 8.4 % IV SOLN
INTRAVENOUS | Status: DC
  Administered 2013-10-22: 16:00:00 via INTRAVENOUS
  Filled 2013-10-22 (×2): qty 150

## 2013-10-22 MED ORDER — FENTANYL CITRATE 0.05 MG/ML IJ SOLN
INTRAMUSCULAR | Status: AC
  Administered 2013-10-22: 100 ug
  Filled 2013-10-22: qty 2

## 2013-10-22 MED ORDER — MIDAZOLAM HCL 2 MG/2ML IJ SOLN
INTRAMUSCULAR | Status: AC
  Administered 2013-10-22: 2 mg
  Filled 2013-10-22: qty 2

## 2013-10-22 MED ORDER — POTASSIUM CHLORIDE 10 MEQ/50ML IV SOLN
10.0000 meq | INTRAVENOUS | Status: AC
  Administered 2013-10-22 (×5): 10 meq via INTRAVENOUS
  Filled 2013-10-22 (×5): qty 50

## 2013-10-22 MED ORDER — IPRATROPIUM-ALBUTEROL 0.5-2.5 (3) MG/3ML IN SOLN
3.0000 mL | Freq: Four times a day (QID) | RESPIRATORY_TRACT | Status: DC
  Administered 2013-10-22 – 2013-10-29 (×29): 3 mL via RESPIRATORY_TRACT
  Filled 2013-10-22 (×29): qty 3

## 2013-10-22 MED ORDER — VECURONIUM BROMIDE 10 MG IV SOLR: 10.0000 mg | INTRAVENOUS | Status: DC | PRN

## 2013-10-22 MED ORDER — ETOMIDATE 2 MG/ML IV SOLN
INTRAVENOUS | Status: AC
  Administered 2013-10-22: 10 mg
  Filled 2013-10-22: qty 10

## 2013-10-22 MED ORDER — FENTANYL BOLUS VIA INFUSION
50.0000 ug | INTRAVENOUS | Status: DC | PRN
  Filled 2013-10-22: qty 100

## 2013-10-22 MED ORDER — LEVOFLOXACIN IN D5W 750 MG/150ML IV SOLN
750.0000 mg | INTRAVENOUS | Status: DC
  Administered 2013-10-22 – 2013-10-25 (×4): 750 mg via INTRAVENOUS
  Filled 2013-10-22 (×4): qty 150

## 2013-10-22 MED ORDER — FENTANYL CITRATE 0.05 MG/ML IJ SOLN
50.0000 ug | Freq: Once | INTRAMUSCULAR | Status: AC
  Administered 2013-10-22: 50 ug via INTRAVENOUS

## 2013-10-22 NOTE — Progress Notes (Signed)
eLink Physician-Brief Progress Note Patient Name: [name] DOB: 1945/09/03 MRN: [id number]   Date of Service  10/22/2013  HPI/Events of Note  ARDS with hypoxia and hypercarbia.  Increased airway pressures from lung injury and obesity.  eICU Interventions  Increase RR to 35.  Maintain deep sedation.  Paralysis not needed at this time given level of sedation.  abg in 1 hr.     Intervention Category Major Interventions: Hypoxemia - evaluation and management;Acid-Base disturbance - evaluation and management  Henry Russel, P 10/22/2013, 4:46 PM

## 2013-10-22 NOTE — Progress Notes (Signed)
Patient ID: [name], female   DOB: July 27, 1945, 68 y.o.   MRN: [id number] wORSENING STATUS THROUGHOUT DAY [place], HYPOXIA, PERMISSIVE HYPERCAPNIA  I have had extensive discussions with family HUSBAND. We discussed patients current circumstances and organ failures. We also discussed patient's prior wishes under circumstances such as this. Family has decided to NOT perform resuscitation if arrest but to continue current medical support for now.  A?p Pna, Trach status, hypercarbic, hypoxioc resp status, ARDS  Plan: Code status had Stat pcxr to ensure no ptx To PCV ratio goal 2:1 Place line, need access Assess cvp A line needed sedate to deep rass and paralysys Prognosis poor and d/w family Multiple attempts at improving O2 needs Put good lung down to improve VQ  Shelly Rossetti. Tyson Alias, MD, FACP Pgr: [phone] [facility]

## 2013-10-22 NOTE — Progress Notes (Signed)
ANTIBIOTIC CONSULT NOTE - FOLLOW UP  Pharmacy Consult for levaquin Indication: HCAP, bacteremia  Allergies  Allergen Reactions  . Codeine     REACTION: hallucinations  . Levofloxacin     REACTION: sores  . Morphine     REACTION: paralysis  . Pregabalin     unknown  . Sodium Benzoate     REACTION: unspecified  . Sulfonamide Derivatives     REACTION: anaphylactic    Patient Measurements: Height: 5' (152.4 cm) Weight: 257 lb 8 oz (116.8 kg) IBW/kg (Calculated) : 45.5  Vital Signs: Temp: 99.6 F (37.6 C) (09/13 0839) Temp src: Oral (09/13 0839) BP: 158/88 mmHg (09/13 0800) Pulse Rate: 134 (09/13 0800) Intake/Output from previous day: 09/12 0701 - 09/13 0700 In: 1985 [I.V.:1275; NG/GT:510; IV Piggyback:200] Out: 96045 [Urine:10525] Intake/Output from this shift: Total I/O In: -  Out: 400 [Urine:400]  Labs:  Recent Labs  10/28/2013 0558 10/21/13 0525  WBC 15.8* 10.9*  HGB 11.1* 10.0*  PLT 309 264  CREATININE 0.65 0.67   Estimated Creatinine Clearance: 81.9 ml/min (by C-G formula based on Cr of 0.67). No results found for this basename: VANCOTROUGH, Leodis Binet, VANCORANDOM, GENTTROUGH, GENTPEAK, GENTRANDOM, TOBRATROUGH, TOBRAPEAK, TOBRARND, AMIKACINPEAK, AMIKACINTROU, AMIKACIN,  in the last 72 hours   Microbiology: Recent Results (from the past 720 hour(s))  CULTURE, BLOOD (ROUTINE X 2)     Status: None   Collection Time    10/16/2013  8:01 AM      Result Value Ref Range Status   Specimen Description BLOOD RIGHT ANTECUBITAL   Final   Special Requests BOTTLES DRAWN AEROBIC AND ANAEROBIC 5CC   Final   Culture  Setup Time     Final   Value: 10/28/2013 14:13     Performed at [facility]   Culture     Final   Value: STREPTOCOCCUS PNEUMONIAE     Note: SUSCEPTIBILITIES PERFORMED ON PREVIOUS CULTURE WITHIN THE LAST 5 DAYS.     Note: Gram Stain Report Called to,Read Back By and Verified With: TINA SAVAGE ON 10/10/2013 AT 11:39P BY WILEJ     Performed at  [facility]   Report Status 10/22/2013 FINAL   Final  CULTURE, BLOOD (ROUTINE X 2)     Status: None   Collection Time    11/05/2013  8:17 AM      Result Value Ref Range Status   Specimen Description BLOOD BLOOD LEFT FOREARM   Final   Special Requests BOTTLES DRAWN AEROBIC ONLY 4CC   Final   Culture  Setup Time     Final   Value: 11/07/2013 14:14     Performed at [facility]   Culture     Final   Value: STREPTOCOCCUS PNEUMONIAE     Note: Gram Stain Report Called to,Read Back By and Verified With:  TINA SAVAGE  10/21/13 SMIAS     Performed at [facility]   Report Status 10/22/2013 FINAL   Final   Organism ID, Bacteria STREPTOCOCCUS PNEUMONIAE   Final  MRSA PCR SCREENING     Status: None   Collection Time    10/16/2013 11:32 AM      Result Value Ref Range Status   MRSA by PCR NEGATIVE  NEGATIVE Final   Comment:            The GeneXpert MRSA Assay (FDA     approved for NASAL specimens     only), is one component of a     comprehensive MRSA  colonization     surveillance program. It is not     intended to diagnose MRSA     infection nor to guide or     monitor treatment for     MRSA infections.  CULTURE, RESPIRATORY (NON-EXPECTORATED)     Status: None   Collection Time    10/25/2013  4:03 PM      Result Value Ref Range Status   Specimen Description TRACHEAL ASPIRATE   Final   Special Requests NONE   Final   Gram Stain     Final   Value: FEW WBC PRESENT,BOTH PMN AND MONONUCLEAR     RARE SQUAMOUS EPITHELIAL CELLS PRESENT     ABUNDANT GRAM NEGATIVE RODS     FEW GRAM POSITIVE COCCI     IN PAIRS   Culture     Final   Value: Culture reincubated for better growth     Performed at [facility]   Report Status PENDING   Incomplete  URINE CULTURE     Status: None   Collection Time    10/13/2013  5:45 PM      Result Value Ref Range Status   Specimen Description URINE, CATHETERIZED   Final   Special Requests NONE   Final   Culture  Setup Time      Final   Value: 10/21/2013 02:09     Performed at [facility] Count     Final   Value: >=100,000 COLONIES/ML     Performed at [facility]   Culture     Final   Value: Multiple bacterial morphotypes present, none predominant. Suggest appropriate recollection if clinically indicated.     Performed at [facility]   Report Status 10/22/2013 FINAL   Final    Anti-infectives   Start     Dose/Rate Route Frequency Ordered Stop   10/21/13 1000  vancomycin (VANCOCIN) 1,500 mg in sodium chloride 0.9 % 500 mL IVPB     1,500 mg 250 mL/hr over 120 Minutes Intravenous Every 24 hours 11/02/2013 0907     10/19/2013 1400  piperacillin-tazobactam (ZOSYN) IVPB 3.375 g     3.375 g 12.5 mL/hr over 240 Minutes Intravenous 3 times per day 11/03/2013 0908     10/23/2013 1200  fluconazole (DIFLUCAN) IVPB 100 mg     100 mg 50 mL/hr over 60 Minutes Intravenous Every 24 hours 11/01/2013 1039     10/26/2013 0915  vancomycin (VANCOCIN) 2,000 mg in sodium chloride 0.9 % 500 mL IVPB     2,000 mg 250 mL/hr over 120 Minutes Intravenous  Once 11/07/2013 0902 10/26/2013 1200   10/27/2013 0730  vancomycin (VANCOCIN) 1,250 mg in sodium chloride 0.9 % 250 mL IVPB  Status:  Discontinued     1,250 mg 166.7 mL/hr over 90 Minutes Intravenous  Once 11/08/2013 0722 11/03/2013 0902   10/14/2013 0730  piperacillin-tazobactam (ZOSYN) IVPB 3.375 g     3.375 g 12.5 mL/hr over 240 Minutes Intravenous  Once 10/12/2013 0722 10/24/2013 0347      Assessment: 68 yo female with HCAP on vancomycin and zosyn to begin levaquin for strep pneumo coverage.  Patient also noted with GNR and GPC in repiratory cultures and to continue vancomycin and zosyn for now. WBC= 10.9, tmax= 99.8, SCr= 0.67 and CrCl ~ 80.  Vanc 9/11> Zosyn 9/11> fluc 9/11>> levaquin 9/13>>  9/11 BCx: 2/2 strep PN (sens LQ, intermediate PCN, resistant to rocephin) 9/11 resp: GNR, few GPC  Goal of Therapy:  Vancomycin trough level 15-20 mcg/ml  Plan:   -Levaquin IV  q24hr -Will follow renal function, cultures and clinical progress  Harland German, Pharm D 10/22/2013 11:40 AM

## 2013-10-22 NOTE — Procedures (Signed)
Central Venous Catheter Insertion Procedure Note [name] [id number] [birth year]  Procedure: Insertion of Central Venous Catheter Indications: Assessment of intravascular volume, Drug and/or fluid administration and Frequent blood sampling  Procedure Details Consent: Risks of procedure as well as the alternatives and risks of each were explained to the (patient/caregiver).  Consent for procedure obtained. Time Out: Verified patient identification, verified procedure, site/side was marked, verified correct patient position, special equipment/implants available, medications/allergies/relevent history reviewed, required imaging and test results available.  Performed  Maximum sterile technique was used including antiseptics, cap, gloves, gown, hand hygiene, mask and sheet. Skin prep: Chlorhexidine; local anesthetic administered A antimicrobial bonded/coated triple lumen catheter was placed in the right internal jugular vein using the Seldinger technique.  Evaluation Blood flow good Complications: No apparent complications Patient did tolerate procedure well. Chest X-ray ordered to verify placement.  CXR: pending.  [name] 10/22/2013, 2:14 PM  Peep to 10 for stick [place]  Mcarthur Rossetti. Tyson Alias, MD, FACP Pgr: [phone] [facility]

## 2013-10-22 NOTE — Progress Notes (Signed)
Panic ABG called to Dr. Tyson Alias.  Per MD, increased peep to 18.

## 2013-10-22 NOTE — Procedures (Signed)
Arterial Catheter Insertion Procedure Note [name] [id number] [birth year]  Procedure: Insertion of Arterial Catheter  Indications: Blood pressure monitoring and Frequent blood sampling  Procedure Details Consent: Unable to obtain consent because of emergent medical necessity. Time Out: Verified patient identification, verified procedure, site/side was marked, verified correct patient position, special equipment/implants available, medications/allergies/relevent history reviewed, required imaging and test results available.  Performed  Maximum sterile technique was used including antiseptics, cap, gloves, gown, hand hygiene, mask and sheet. Skin prep: Chlorhexidine; local anesthetic administered 20 gauge catheter was inserted into right radial artery using the Seldinger technique.  Evaluation Blood flow good; BP tracing good. Complications: No apparent complications  Arterial line placed by Jannifer Rodney RRT and assisted by myself using sterile technique.    Jennette Kettle 10/22/2013

## 2013-10-22 NOTE — Progress Notes (Signed)
eLink Physician-Brief Progress Note Patient Name: NALANI ANDREEN DOB: 10/04/1945 MRN: [id number]   Date of Service  10/22/2013  HPI/Events of Note  Worsening hypoxemia with increased airway pressure.  Cxr with increased infiltrate.  Good diuresis yesterday per documented I and O.  eICU Interventions  Vent adjusted to help maintain adequate saturation.  Airway suctioned by RT.     Intervention Category Major Interventions: Hypoxemia - evaluation and management  Henry Russel, P 10/22/2013, 5:43 AM

## 2013-10-22 NOTE — Progress Notes (Signed)
Notified respiratory therapist of patient's decline in respiratory status after suctioning a couple of times and oxygen saturation still in the 80's. Respiratory lavaged her and worked on her some more but saturation did not come up. E-link MD notified. Respiratory Therapist got new orders from MD. Chest Xray ordered. Will continue to monitor.  Garvin Fila, RN

## 2013-10-22 NOTE — Progress Notes (Signed)
Spoke w/ Dr. Katrinka Blazing re: ABG results, rec'd order to increase rate 35.

## 2013-10-22 NOTE — Progress Notes (Signed)
Increased peep to 20 per MD at bedside evaluating pt.  Sat 82-83%

## 2013-10-22 NOTE — Progress Notes (Signed)
PULMONARY / CRITICAL CARE MEDICINE   Name: CHASEY DULL MRN: [id number] DOB: Aug 16, 1945    ADMISSION DATE:  2013-11-05  REFERRING MD :  EDP  CHIEF COMPLAINT:  PNA, Respiratory Failure   INITIAL PRESENTATION: 68 y/o F, chronic trach/vent patient, admitted to [facility] 9/11 for L PNA.  Baseline ATC during day, vent QHS.    STUDIES:  9/11  CT Chest > trach in good position, diffuse consolidation on L, patchy infiltrates RLL, Flattening of main stem bronchi noted  SIGNIFICANT EVENTS: 9/11  Admit with L HCAP, UTI 9/13 worsening resp status, requiring vent 24/7, tx ICU    SUBJECTIVE:  Now in ICU.  Awake, alert on vent. No c/o.   VITAL SIGNS: Temp:  [98.9 F (37.2 C)-99.8 F (37.7 C)] 99.6 F (37.6 C) (09/13 0839) Pulse Rate:  [98-134] 134 (09/13 0800) Resp:  [17-41] 35 (09/13 0800) BP: (102-158)/(54-88) 158/88 mmHg (09/13 0800) SpO2:  [84 %-100 %] 84 % (09/13 0800) FiO2 (%):  [40 %-100 %] 100 % (09/13 0805) Weight:  [257 lb 8 oz (116.8 kg)] 257 lb 8 oz (116.8 kg) (09/13 0425)  HEMODYNAMICS:    VENTILATOR SETTINGS: Vent Mode:  [-] PRVC FiO2 (%):  [40 %-100 %] 100 % Set Rate:  [10 bmp-30 bmp] 28 bmp Vt Set:  [360 mL-500 mL] 500 mL PEEP:  [5 cmH20-16 cmH20] 16 cmH20 Plateau Pressure:  [34 cmH20-39 cmH20] 38 cmH20  INTAKE / OUTPUT:  Intake/Output Summary (Last 24 hours) at 10/22/13 1100 Last data filed at 10/22/13 0800  Gross per 24 hour  Intake   1760 ml  Output  40981 ml  Net  -9165 ml    PHYSICAL EXAMINATION: General:  Chronically ill adult female in NAD Neuro:  Awake, alert, generalized weakness HEENT:  Mm pink/moist, poor dentition, #6 XLT trach midline c/d/i Cardiovascular:  s1s2 rrr, no m/r/g Lungs:  resp's even/non-labored on vent, lungs bilaterally diminished L>R, mild exp wheeze  Abdomen:  Round/soft, bsx4 active, PEG site fine Musculoskeletal:  No acute deformities, foot drop noted, RUE swelling Skin:  Warm/dry, thin skin, multiple bruises / tears to upper  extremities   LABS:  CBC  Recent Labs Lab 11/05/2013 0558 10/21/13 0525  WBC 15.8* 10.9*  HGB 11.1* 10.0*  HCT 36.4 32.9*  PLT 309 264   Coag's  No results found for this basename: APTT, INR,  in the last 168 hours  BMET  Recent Labs Lab 05-Nov-2013 0558 10/21/13 0525  NA 142 141  K 4.9 3.9  CL 91* 97  CO2 36* 34*  BUN 44* 55*  CREATININE 0.65 0.67  GLUCOSE 198* 157*   Electrolytes  Recent Labs Lab 05-Nov-2013 0558 10/21/13 0525  CALCIUM 9.7 8.9   Sepsis Markers  Recent Labs Lab November 05, 2013 1224 10/21/13 0525 10/22/13 0715  LATICACIDVEN 3.5*  --   --   PROCALCITON 29.93 33.75 16.07    ABG  Recent Labs Lab 10/22/13 0645 10/22/13 0752  PHART 7.380 7.240*  PCO2ART 56.5* 84.3*  PO2ART 51.0* 50.2*    Liver Enzymes  Recent Labs Lab 11/05/2013 0558  AST 26  ALT 36*  ALKPHOS 89  BILITOT 0.3  ALBUMIN 2.7*   Cardiac Enzymes  Recent Labs Lab 11-05-2013 0558 10/21/13 0525  PROBNP 2233.0* 2401.0*   Glucose  Recent Labs Lab 10/21/13 1138 10/21/13 1610 10/21/13 2008 10/22/13 0014 10/22/13 0424 10/22/13 0835  GLUCAP 153* 234* 265* 282* 271* 307*    Imaging Dg Chest Port 1 View  10/21/2013   CLINICAL  DATA:  Pneumonia left lower lobe  EXAM: PORTABLE CHEST - 1 VIEW  COMPARISON:  10/16/2013 CT scan and radiograph  FINDINGS: Tracheostomy tube projects over the tracheal air column. Small focus of infiltrate right lower lobe stable from prior study. Significant cardiac enlargement unchanged. Extensive consolidation throughout most of the left lung with some aerated apical lung, stable.  IMPRESSION: No significant change from prior radiograph.   Electronically Signed   By: Esperanza Heir M.D.   On: 10/21/2013 07:41     ASSESSMENT / PLAN:  PULMONARY Chronic Trach #6 XLT A: Acute on Chronic Respiratory Failure - r/t worsening HCAP, pleural effusion, OHS/OSA HCAP OSA ALI/ARDS P:   Full vent support, 8 cc/kg> continue to reduce TV further to plat  less 30, realizing that thick chest wall is contributing to plat Increase RR 30 Decrease Vt with high plat F/u ABG  F/u CXR  Cont abx as below  pulm hygiene BD's - add scheduled duoneb  Lasix reduction   CARDIOVASCULAR CVL n/a A:  CAD HLD P:   Trend BNP  RENAL A:   Massive diuresis P:   Will need to address home PRN lasix dosing (Q6 PRN) at discharge Monitor BMP Replace electrolytes as indicated  bemt now Reduce lasix  GASTROINTESTINAL A:   GERD Protein Calorie Malnutrition  Dysphagia - had been eating at home until a week ago P:   Continue pepcid (home med) SLP evaluation for swallowing when able   HEMATOLOGIC Anemia - likely of chronic disease  History of Present Illness  R/o septic thrombophlebitits P:  Trend CBC DVT: Heparin SQ doplper left arm  INFECTIOUS A:   HCAP - L diffuse infiltrate, mild R lower, concern for aspiration (sputum gram neg rod) Yeast UTI  Strep pneumo bacteremia  P:   BCx2 9/11 >>2/2 strep pneumo (RES ceftriaxone, PCN, sens levaquin) UC 9/11 >>mult morph>>> Sputum 9/11 > abun gnr >>> Legionella 9/11 >> U. Strep 9/11 >>   Abx: Vanco 9/11 >>> Zosyn, 9/11 >>> Diflucan 9/11 >>> Levaquin 9/13>>> Addition levaquin for strep pneumo bacteremia (INT pcn)  Cont zosyn for abund GNR sputum although suspect may be same but declined clinically Consider d/c vanc if no mrsa in sputum F/u pct  If drops BP change zosyn to IMI   ENDOCRINE A:   DM   Hypothyroidism  P:   Continue Synthroid SSI, resistant scale Increase lantus 9/13  NEUROLOGIC A:   Chronic Pain / Fibromyalgia Osteoarthritis  P:   RASS goal: n/a Continue home klonopin, zoloft Hold home fentanyl patch PRN Fentanyl for pain  May need fent drip if does handle lower Tv well    Summary:   Chronic vent dep pt with worsening HCAP, respiratory acidosis, bacteremia.  Continuous vent, double cover abx for now. F/u ABG.  Doppler, lower TV Husband updated  Dirk Dress, NP 10/22/2013  11:00 AM Pager: [phone] or [phone]  *Care during the described time interval was provided by me and/or other providers on the critical care team. I have reviewed this patient's available data, including medical history, events of note, physical examination and test results as part of my evaluation.   Ccm time 30 min  Mcarthur Rossetti. Tyson Alias, MD, FACP Pgr: [phone] [facility]

## 2013-10-22 NOTE — Consult Note (Signed)
Palliative Medicine Team at [facility]  Date: 10/22/2013   Patient Name: [name]  DOB: Jan 11, 1946  MRN: [id number]  Age / Sex: 68 y.o., female   PCP: No primary provider on file. Referring Physician: Oretha Milch, MD  Active Problems: Patient Active Problem List   Diagnosis Date Noted  . PNA (pneumonia) Oct 25, 2013  . Cardiac arrest 08/23/2013  . Obesity hypoventilation syndrome 08/17/2013  . Aspiration pneumonia 08/15/2013  . Aspiration into airway 08/15/2013  . Chronic respiratory failure with hypoxia 07/31/2013  . Sepsis 12/21/2012  . Tracheostomy status 12/19/2012  . Acute respiratory failure with hypoxia 12/12/2012  . OSA (obstructive sleep apnea) 12/12/2012  . Pneumonia 12/12/2012  . MUSCULOSKELETAL PAIN 02/28/2010  . UNSPECIFIED CHRONIC BRONCHITIS 09/19/2009  . CHEST PAIN 08/20/2008  . OBESITY HYPOVENTILATION SYNDROME 08/07/2008  . BACTERIAL VAGINITIS 10/07/2007  . INTERTRIGO 10/07/2007  . EDEMA 10/07/2007  . CORONARY ARTERY DISEASE 06/02/2007  . SLEEP APNEA, OBSTRUCTIVE 03/23/2007  . DIABETES MELLITUS, TYPE II 02/25/2007  . HYPERLIPIDEMIA 02/25/2007  . ALLERGIC RHINITIS 02/25/2007  . ASTHMA 02/25/2007  . GERD 02/25/2007  . ACUTE CYSTITIS 02/25/2007  . OSTEOARTHRITIS 02/25/2007  . POLYMYALGIA RHEUMATICA 02/25/2007  . FIBROMYALGIA 02/25/2007  . OSTEOPOROSIS 02/25/2007  . COLONIC POLYPS, HX OF 02/25/2007    HPI/Reason for Consultation: [name] is a 68 y.o. female with chronic trach, vent dependent respiratory failure cared for at home with vent by her husband [name]. "[name]" has been trach and PM vent dependent since 11/2012-she was at [facility] for several months. She had a PMV and was eating a soft diet prior to admission. Mostly bed bound. She has been readmitted for second time in 6 months with PNA sepsis, she has developed decubitus ulcers. PMT consulted for goals of care.  Participants in Discussion: HCPOA: no formal paperwork- husband  [name], [id number]   Advance Directive: None on chart-I have requested   Code Status Orders        Start     Ordered   10/22/13 1326  Do not attempt resuscitation (DNR)   Continuous    Question Answer Comment  In the event of cardiac or respiratory ARREST Do not call a "code blue"   In the event of cardiac or respiratory ARREST Do not perform Intubation, CPR, defibrillation or ACLS   In the event of cardiac or respiratory ARREST Use medication by any route, position, wound care, and other measures to relive pain and suffering. May use oxygen, suction and manual treatment of airway obstruction as needed for comfort.      10/22/13 1326      I have reviewed the medical record, interviewed the patient and family, and examined the patient. The following aspects are pertinent.  Past Medical History  Diagnosis Date  . Fibromyalgia   . Osteoarthritis   . Asthma   . Type II or unspecified type diabetes mellitus without mention of complication, not stated as uncontrolled   . Esophageal reflux   . Allergic rhinitis, cause unspecified   . OSA on CPAP   . CAD (coronary artery disease)   . Other and unspecified hyperlipidemia    History   Social History  . Marital Status: Married    Spouse Name: N/A    Number of Children: N/A  . Years of Education: N/A   Occupational History  . Disabled    Social History Main Topics  . Smoking status: Former Smoker -- 1.00 packs/day for 12 years    Types: Cigarettes  Quit date: 02/10/1992  . Smokeless tobacco: Never Used     Comment: Quit 1980s  . Alcohol Use: No  . Drug Use: None  . Sexual Activity: No   Other Topics Concern  . None   Social History Narrative  . None   Family History  Problem Relation Age of Onset  . Pancreatic cancer Brother   . Lung cancer Sister   . Colon cancer Maternal Grandfather   . Diabetes Father    Scheduled Meds: . antiseptic oral rinse  7 mL Mouth Rinse QID  . chlorhexidine  15 mL Mouth Rinse BID    . clonazePAM  0.25 mg Oral TID  . dexamethasone  2 mg Oral Daily  . dorzolamide  1 drop Both Eyes BID  . famotidine  20 mg Oral BID  . feeding supplement (PRO-STAT SUGAR FREE 64)  30 mL Per Tube BID  . fluconazole (DIFLUCAN) IV  100 mg Intravenous Q24H  . heparin  5,000 Units Subcutaneous 3 times per day  . hyoscyamine  0.125 mg Sublingual BID  . insulin aspart  0-20 Units Subcutaneous 6 times per day  . insulin glargine  30 Units Subcutaneous QHS  . ipratropium-albuterol  3 mL Nebulization Q6H  . latanoprost  1 drop Both Eyes QHS  . levofloxacin (LEVAQUIN) IV  750 mg Intravenous Q24H  . levothyroxine  50 mcg Per Tube QAC breakfast  . multivitamin with minerals  1 tablet Oral Daily  . piperacillin-tazobactam (ZOSYN)  IV  3.375 g Intravenous 3 times per day  . polyvinyl alcohol  1 drop Both Eyes BID  . potassium chloride  10 mEq Intravenous Q1 Hr x 5  . sertraline  50 mg Per Tube Daily  . vancomycin  1,500 mg Intravenous Q24H   Continuous Infusions: . sodium chloride 75 mL/hr at 10/12/2013 2000  . feeding supplement (VITAL HIGH PROTEIN) 1,000 mL (10/22/13 1055)  . fentaNYL infusion INTRAVENOUS 50 mcg/hr (10/22/13 1623)  .  sodium bicarbonate  infusion 1000 mL 50 mL/hr at 10/22/13 1623   PRN Meds:.sodium chloride, albuterol, docusate, docusate, fentaNYL, fentaNYL, fentaNYL, vecuronium Allergies  Allergen Reactions  . Codeine     REACTION: hallucinations  . Levofloxacin     REACTION: sores  . Morphine     REACTION: paralysis  . Pregabalin     unknown  . Sodium Benzoate     REACTION: unspecified  . Sulfonamide Derivatives     REACTION: anaphylactic   CBC:    Component Value Date/Time   WBC 10.9* 10/21/2013 0525   HGB 10.0* 10/21/2013 0525   HCT 32.9* 10/21/2013 0525   PLT 264 10/21/2013 0525   MCV 96.8 10/21/2013 0525   NEUTROABS 14.1* 10/19/2013 0558   LYMPHSABS 1.4 11/04/2013 0558   MONOABS 0.3 10/16/2013 0558   EOSABS 0.0 10/15/2013 0558   BASOSABS 0.0 10/17/2013 0558    Comprehensive Metabolic Panel:    Component Value Date/Time   NA 142 10/22/2013 1242   K 2.9* 10/22/2013 1242   CL 98 10/22/2013 1242   CO2 34* 10/22/2013 1242   BUN 42* 10/22/2013 1242   CREATININE 0.45* 10/22/2013 1242   GLUCOSE 276* 10/22/2013 1242   CALCIUM 9.1 10/22/2013 1242   AST 26 10/31/2013 0558   ALT 36* 10/11/2013 0558   ALKPHOS 89 10/23/2013 0558   BILITOT 0.3 11/06/2013 0558   PROT 6.5 10/13/2013 0558   ALBUMIN 2.7* 11/07/2013 0558    Vital Signs: BP 106/49  Pulse 125  Temp(Src) 99.1 F (37.3  C) (Oral)  Resp 18  Ht 5' (1.524 m)  Wt 116.8 kg (257 lb 8 oz)  BMI 50.29 kg/m2  SpO2 93% Filed Weights   11/04/2013 0510 10/21/13 0500 10/22/13 0425  Weight: 104.327 kg (230 lb) 113.6 kg (250 lb 7.1 oz) 116.8 kg (257 lb 8 oz)   09/12 0701 - 09/13 0700 In: 1985 [I.V.:1275; NG/GT:510; IV Piggyback:200] Out: 78295 [Urine:10525]  Physical Exam:  Morbidly obese, diaphoretic, chronically ill appearing, LE are contracted with foot drop, on vent sedated deeply.  Summary of Established Goals of Care and Medical Treatment Preferences  Primary Diagnoses  1. VDRF, chronic with acute PNA 2. OHS/OSA  Active Symptoms: 1. Immobility 2. Dyspnea 3. Pain 4. Dysphagia  Psycho-social/Spiritual:  Husband [name] is her primary caretaker, they have been married for 38 years-they have no children together and he has children from another marriage that are grown and not involved in her care. [name] hopes for her recovery- recovery for him is getting over the PNA, for [name] to be able to eat, use her PMV valve and get up in a wheelchair-he knows this may be unrealistic. He talks about how the [facility] doctors cared so much they did everything possible and were tearful with joy when she coded and they brought her back-to [name] that meant everything. I asked [name] what he think [name] thought about her condition- he said that she prayed everyday that the Shaune Pollack would take her life. He says she hasn't been well  or happy in years. The past year has been very difficult. [name] says he isnt ready to lose her and will miss her terribly when she is gone- she is really all he has right now other than his job/work which he really likes-he likes to stay busy. He is committed to caring for [place].   I encouraged [name] to think about QOL, comfort and dignity for his wife- as well as allowing for a natural death to occur when it is time with out continuing very aggressive medical interventions as a very compassionate and courageous choice especially given his very strong reported faith. He knows how serious her current condition is at this point in time.  Prognosis: very poor- may not survive hospitalization   Palliative Performance Scale: 10%  Recommendations:  1. Code Status:   DNR, this is a solid choice- he needs a [place] form if she survives to discharge. 2. Scope of Treatment:   Full scope for now 3. Recommend maximizing medical interventions for defined period of time and if no improvement consider de-escalation of medically aggressive interventions- I am happy to meet with [name] again and talk about next steps and medical decision making- and explore how much should we continue to do given her overall very poor condition. I suspect [name] is on the verge of being ready to accept her impending death-per nursing the patient herself wanted comfort. 4. Symptom Management: she is currently sedated on the vent post TLC placement 5. Palliative Prophylaxis: ICU protocols in place for PAIN-AD, CAM-ICU 6. Disposition: TBD   Time In: 4:45 Time Out: 6PM Time Total: 75 min Greater than 50%  of this time was spent counseling and coordinating care related to the above assessment and plan.  Signed by: Hilbert Odor, DO  10/22/2013, 6:01 PM  Please contact Palliative Medicine Team phone at [phone] for questions and concerns.

## 2013-10-22 NOTE — Progress Notes (Signed)
Utilization review completed.  

## 2013-10-22 NOTE — Progress Notes (Addendum)
Pt's O2 sat remains in the 80s despite ventilator settings changes made earlier by RT. Pt is lethargic and does not follow command. Rapid response and MD called. Pt to be transferred to the ICU.

## 2013-10-22 NOTE — Progress Notes (Signed)
Dr. Katrinka Blazing notified of ABG results, no new vent orders rec'd now.  RN aware.

## 2013-10-22 NOTE — Progress Notes (Signed)
CRITICAL VALUE ALERT  Critical value received: PCO2= 80.5  Date of notification:  10/22/13  Time of notification:  2122  Critical value read back:Yes.    Nurse who received alert:  Dustin Flock RN  MD notified (1st page):  Dr Katrinka Blazing  Time of first page:  2130  MD notified (2nd page):  Time of second page:  Responding MD:  Dr Katrinka Blazing  Time MD responded:  [phone]

## 2013-10-22 NOTE — Progress Notes (Signed)
Panic ABG results called to [facility] Box given to "Okey Regal", she states she will give these results to Dr. Katrinka Blazing.  RN aware.

## 2013-10-23 DIAGNOSIS — J9589 Other postprocedural complications and disorders of respiratory system, not elsewhere classified: Secondary | ICD-10-CM

## 2013-10-23 DIAGNOSIS — J80 Acute respiratory distress syndrome: Secondary | ICD-10-CM

## 2013-10-23 DIAGNOSIS — R0902 Hypoxemia: Secondary | ICD-10-CM

## 2013-10-23 DIAGNOSIS — J9610 Chronic respiratory failure, unspecified whether with hypoxia or hypercapnia: Secondary | ICD-10-CM

## 2013-10-23 DIAGNOSIS — R609 Edema, unspecified: Secondary | ICD-10-CM

## 2013-10-23 LAB — BLOOD GAS, ARTERIAL
ACID-BASE EXCESS: 4.4 mmol/L — AB (ref 0.0–2.0)
Acid-Base Excess: 6.1 mmol/L — ABNORMAL HIGH (ref 0.0–2.0)
BICARBONATE: 32.3 meq/L — AB (ref 20.0–24.0)
Bicarbonate: 31.6 mEq/L — ABNORMAL HIGH (ref 20.0–24.0)
DRAWN BY: 39866
DRAWN BY: 39866
FIO2: 0.9 %
FIO2: 1 %
LHR: 35 {breaths}/min
LHR: 35 {breaths}/min
MECHVT: 320 mL
MECHVT: 400 mL
O2 SAT: 91.4 %
O2 SAT: 99.7 %
PATIENT TEMPERATURE: 99
PEEP/CPAP: 20 cmH2O
PEEP: 5 cmH2O
PO2 ART: 68.7 mmHg — AB (ref 80.0–100.0)
Patient temperature: 98.5
TCO2: 33.4 mmol/L (ref 0–100)
TCO2: 35.1 mmol/L (ref 0–100)
pCO2 arterial: 60.1 mmHg (ref 35.0–45.0)
pCO2 arterial: 90 mmHg (ref 35.0–45.0)
pH, Arterial: 7.18 — CL (ref 7.350–7.450)
pH, Arterial: 7.342 — ABNORMAL LOW (ref 7.350–7.450)
pO2, Arterial: 172 mmHg — ABNORMAL HIGH (ref 80.0–100.0)

## 2013-10-23 LAB — GLUCOSE, CAPILLARY
GLUCOSE-CAPILLARY: 372 mg/dL — AB (ref 70–99)
GLUCOSE-CAPILLARY: 280 mg/dL — AB (ref 70–99)
GLUCOSE-CAPILLARY: 277 mg/dL — AB (ref 70–99)
Glucose-Capillary: 309 mg/dL — ABNORMAL HIGH (ref 70–99)
Glucose-Capillary: 283 mg/dL — ABNORMAL HIGH (ref 70–99)
Glucose-Capillary: 290 mg/dL — ABNORMAL HIGH (ref 70–99)

## 2013-10-23 LAB — BASIC METABOLIC PANEL
ANION GAP: 12 (ref 5–15)
BUN: 51 mg/dL — ABNORMAL HIGH (ref 6–23)
CALCIUM: 8 mg/dL — AB (ref 8.4–10.5)
CO2: 30 mEq/L (ref 19–32)
CREATININE: 0.53 mg/dL (ref 0.50–1.10)
Chloride: 99 mEq/L (ref 96–112)
GFR calc Af Amer: >90 mL/min (ref >90)
GFR calc non Af Amer: >90 mL/min (ref >90)
Glucose, Bld: 371 mg/dL — ABNORMAL HIGH (ref 70–99)
Potassium: 3.7 mEq/L (ref 3.7–5.3)
SODIUM: 141 meq/L (ref 137–147)

## 2013-10-23 LAB — CBC
HCT: 29.8 % — ABNORMAL LOW (ref 36.0–46.0)
Hemoglobin: 8.9 g/dL — ABNORMAL LOW (ref 12.0–15.0)
MCH: 29.6 pg (ref 26.0–34.0)
MCHC: 29.9 g/dL — AB (ref 30.0–36.0)
MCV: 99 fL (ref 78.0–100.0)
PLATELETS: 202 10*3/uL (ref 150–400)
RBC: 3.01 MIL/uL — ABNORMAL LOW (ref 3.87–5.11)
RDW: 16.1 % — ABNORMAL HIGH (ref 11.5–15.5)
WBC: 8 10*3/uL (ref 4.0–10.5)

## 2013-10-23 LAB — VANCOMYCIN, TROUGH: Vancomycin Tr: 15.8 ug/mL (ref 10.0–20.0)

## 2013-10-23 LAB — PROCALCITONIN: Procalcitonin: 11.99 ng/mL

## 2013-10-23 MED ORDER — ONDANSETRON HCL 4 MG/2ML IJ SOLN
4.0000 mg | Freq: Four times a day (QID) | INTRAMUSCULAR | Status: DC | PRN
  Administered 2013-10-23 – 2013-10-24 (×3): 4 mg via INTRAVENOUS
  Filled 2013-10-23 (×3): qty 2

## 2013-10-23 MED ORDER — FREE WATER
200.0000 mL | Freq: Three times a day (TID) | Status: DC
  Administered 2013-10-23 – 2013-10-24 (×3): 200 mL

## 2013-10-23 MED ORDER — FAMOTIDINE 20 MG PO TABS
20.0000 mg | ORAL_TABLET | Freq: Two times a day (BID) | ORAL | Status: DC
  Administered 2013-10-23 – 2013-10-28 (×11): 20 mg
  Filled 2013-10-23 (×15): qty 1

## 2013-10-23 MED ORDER — CLONAZEPAM 0.1 MG/ML ORAL SUSPENSION: 2.0000 mg | Freq: Two times a day (BID) | ORAL | Status: DC

## 2013-10-23 MED ORDER — ONDANSETRON HCL 4 MG/2ML IJ SOLN: 4.0000 mg | Freq: Four times a day (QID) | INTRAMUSCULAR | Status: DC

## 2013-10-23 MED ORDER — CLONAZEPAM 0.5 MG PO TABS
2.0000 mg | ORAL_TABLET | Freq: Two times a day (BID) | ORAL | Status: DC
  Administered 2013-10-23 – 2013-10-28 (×11): 2 mg
  Filled 2013-10-23 (×11): qty 4

## 2013-10-23 NOTE — Progress Notes (Signed)
SLP Cancellation Note  Patient Details Name: CHIRSTINE DEFRAIN MRN: [id number] DOB: 1945-03-30   Cancelled treatment:       Reason Eval/Treat Not Completed: Medical issues which prohibited therapy. Per RN, patient not medically appropriate. Signing off. Please re-consult if needed.  Ferdinand Lango MA, CCC-SLP [phone]    Ferdinand Lango Meryl 10/23/2013, 1:36 PM

## 2013-10-23 NOTE — Progress Notes (Signed)
PULMONARY / CRITICAL CARE MEDICINE   Name: [name] MRN: [id number] DOB: 04-16-1945    ADMISSION DATE:  11/03/2013  REFERRING MD :  EDP  CHIEF COMPLAINT:  PNA, Respiratory Failure   INITIAL PRESENTATION: 68 y/o F, chronic trach/vent patient, admitted to [facility] 9/11 for L PNA.  Baseline ATC during day, vent QHS.    STUDIES:  9/11  CT Chest > trach in good position, diffuse consolidation on L, patchy infiltrates RLL, Flattening of main stem bronchi noted  SIGNIFICANT EVENTS: 9/11  Admit with L HCAP, UTI 9/13 worsening resp status, requiring vent 24/7, tx ICU    SUBJECTIVE:  RASS -2. Very high PIPs on vent   VITAL SIGNS: Temp:  [97.7 F (36.5 C)-99.7 F (37.6 C)] 99.7 F (37.6 C) (09/14 1535) Pulse Rate:  [90-115] 102 (09/14 1600) Resp:  [16-35] 31 (09/14 1600) BP: (84-137)/(45-63) 129/49 mmHg (09/14 1600) SpO2:  [91 %-100 %] 93 % (09/14 1600) Arterial Line BP: (87-130)/(48-83) 124/57 mmHg (09/14 1600) FiO2 (%):  [70 %-100 %] 70 % (09/14 1500) Weight:  [117.6 kg (259 lb 4.2 oz)] 117.6 kg (259 lb 4.2 oz) (09/14 0500)  HEMODYNAMICS: CVP:  [13 mmHg-17 mmHg] 17 mmHg  VENTILATOR SETTINGS: Vent Mode:  [-] PRVC FiO2 (%):  [70 %-100 %] 70 % Set Rate:  [35 bmp] 35 bmp Vt Set:  [320 mL-400 mL] 400 mL PEEP:  [14 cmH20-20 cmH20] 14 cmH20 Plateau Pressure:  [32 cmH20-52 cmH20] 32 cmH20  INTAKE / OUTPUT:  Intake/Output Summary (Last 24 hours) at 10/23/13 1700 Last data filed at 10/23/13 1600  Gross per 24 hour  Intake 5161.55 ml  Output    895 ml  Net 4266.55 ml    PHYSICAL EXAMINATION: General:  Chronically ill adult female in NAD Neuro:  Awake, alert, generalized weakness HEENT:  Mm pink/moist, poor dentition, #6 XLT trach midline c/d/i Cardiovascular:  s1s2 rrr, no m/r/g Lungs:  resp's even/non-labored on vent, lungs bilaterally diminished L>R, mild exp wheeze  Abdomen:  Round/soft, bsx4 active, PEG site fine Musculoskeletal:  No acute deformities, foot drop noted,  RUE swelling Skin:  Warm/dry, thin skin, multiple bruises / tears to upper extremities   LABS:  CBC  Recent Labs Lab 11/06/2013 0558 10/21/13 0525 10/23/13 0442  WBC 15.8* 10.9* 8.0  HGB 11.1* 10.0* 8.9*  HCT 36.4 32.9* 29.8*  PLT 309 264 202   Coag's  No results found for this basename: APTT, INR,  in the last 168 hours  BMET  Recent Labs Lab 10/21/13 0525 10/22/13 1242 10/23/13 0442  NA 141 142 141  K 3.9 2.9* 3.7  CL 97 98 99  CO2 34* 34* 30  BUN 55* 42* 51*  CREATININE 0.67 0.45* 0.53  GLUCOSE 157* 276* 371*   Electrolytes  Recent Labs Lab 10/21/13 0525 10/22/13 1242 10/23/13 0442  CALCIUM 8.9 9.1 8.0*   Sepsis Markers  Recent Labs Lab 10/28/2013 1224  10/22/13 0715 10/22/13 1242 10/23/13 0442  LATICACIDVEN 3.5*  --   --   --   --   PROCALCITON 29.93  < > 16.07 14.23 11.99  < > = values in this interval not displayed.  ABG  Recent Labs Lab 10/22/13 2122 10/23/13 0045 10/23/13 0450  PHART 7.239* 7.180* 7.342*  PCO2ART 80.5* 90.0* 60.1*  PO2ART 112.0* 68.7* 172.0*    Liver Enzymes  Recent Labs Lab 10/19/2013 0558  AST 26  ALT 36*  ALKPHOS 89  BILITOT 0.3  ALBUMIN 2.7*   Cardiac Enzymes  Recent Labs Lab 10/18/2013 0558 10/21/13 0525  PROBNP 2233.0* 2401.0*   Glucose  Recent Labs Lab 10/22/13 1918 10/22/13 2328 10/23/13 0345 10/23/13 0858 10/23/13 1239 10/23/13 1534  GLUCAP 355* 309* 372* 280* 283* 277*    Imaging Dg Chest Port 1 View  10/22/2013   CLINICAL DATA:  Line placement.  EXAM: PORTABLE CHEST - 1 VIEW  COMPARISON:  10/22/2013  FINDINGS: The patient is moderately rotated.  A right IJ central venous catheter is noted with tip over the expected location of the upper SVC.  There is no evidence of pneumothorax.  Diffuse bilateral airspace opacities are stable a slightly increased from the prior study.  A tracheostomy tube is again identified.  No other changes identified.  IMPRESSION: Right IJ central venous catheter  with tip over the expected location of the upper SVC. No evidence of pneumothorax.  Stable to slightly increasing diffuse bilateral airspace opacities.   Electronically Signed   By: Laveda Abbe M.D.   On: 10/22/2013 14:54   Dg Chest Port 1 View  10/22/2013   CLINICAL DATA:  Hypoxia.  EXAM: PORTABLE CHEST - 1 VIEW  COMPARISON:  Chest radiograph performed 10/21/2013  FINDINGS: The lungs are well-aerated. Severe bilateral airspace opacification is noted, relatively confluent at the left mid and lower lung zones. This is concerning for severe multifocal pneumonia, though pulmonary edema might have a similar appearance. A small left pleural effusion is noted. ARDS cannot be excluded. No pneumothorax is seen.  The cardiomediastinal silhouette is borderline normal in size. A tracheostomy tube is seen ending 4-5 cm above the carina. No acute osseous abnormalities are seen.  IMPRESSION: Severe bilateral airspace opacification, relatively confluent at the left mid and lower lung zones. This is concerning for severe multifocal pneumonia, though pulmonary edema might have a similar appearance. ARDS cannot be excluded. This is significantly worsened from the recent prior study. Small left pleural effusion noted.   Electronically Signed   By: Roanna Raider M.D.   On: 10/22/2013 05:55     ASSESSMENT / PLAN:  PULMONARY Chronic Trach #6 XLT A: Acute on Chronic Respiratory Failure - r/t worsening HCAP, pleural effusion, OHS/OSA HCAP OSA ARDS P:   Cont full vent support - settings reviewed and/or adjusted Cont vent bundle Daily SBT if/when meets criteria ARDS protocol  CARDIOVASCULAR R IJ CVL 9/13 >>  A:  CAD HLD P:   Monitor DNR if arrests  RENAL A:   Severe hypervolemia/anasarca P:   Monitor BMET intermittently Monitor I/Os Correct electrolytes as indicated Not a candidate for HD  GASTROINTESTINAL A:   GERD Dysphagia due to trach tube P:   SUP: enteral famotidine Cont  TFs  HEMATOLOGIC Anemia - likely of chronic disease  P:  DVT px: SQ heparin Monitor CBC intermittently Transfuse per usual ICU guidelines  INFECTIOUS A:   Pseudomonas HCAP Yeast UTI  Strep pneumo bacteremia  Severe sepsis Elevated PCT P:   BCx2 9/11 >>2/2 strep pneumo (RES ceftriaxone, PCN, sens levaquin) UC 9/11 >> multpile organisms Sputum 9/11 >> pseudomonas  Abx: Vanco 9/11 >> 9/14 Diflucan 9/11 >> 9/14 Zosyn, 9/11 >>  Levaquin 9/13>>    ENDOCRINE A:   DM 2 Hypothyroidism  P:   Continue Synthroid Cont SSI, resistant scale Cont Lantus  NEUROLOGIC A:   Chronic Pain / Fibromyalgia Osteoarthritis  P:   RASS goal: -2 Continue home klonopin, zoloft Hold home fentanyl patch PRN Fentanyl for pain  May need fent drip if does handle lower Tv well  CCM X 40 mins  Unlikely to survive in a satisfactory way. Full comfort care would be appropriate. No family to discuss  Billy Fischer, MD ; [facility] [phone].  After 5:30 PM or weekends, call [phone]

## 2013-10-23 NOTE — Progress Notes (Signed)
ANTIBIOTIC CONSULT NOTE - FOLLOW UP  Pharmacy Consult for levaquin Indication: HCAP, bacteremia  Allergies  Allergen Reactions  . Codeine     REACTION: hallucinations  . Levofloxacin     REACTION: sores  . Morphine     REACTION: paralysis  . Pregabalin     unknown  . Sodium Benzoate     REACTION: unspecified  . Sulfonamide Derivatives     REACTION: anaphylactic    Patient Measurements: Height: 5' (152.4 cm) Weight: 259 lb 4.2 oz (117.6 kg) IBW/kg (Calculated) : 45.5  Vital Signs: Temp: 99.5 F (37.5 C) (09/14 1242) Temp src: Oral (09/14 1242) BP: 107/53 mmHg (09/14 1200) Pulse Rate: 96 (09/14 1200) Intake/Output from previous day: 09/13 0701 - 09/14 0700 In: 4992.6 [I.V.:2491.6; NG/GT:1035; IV Piggyback:1075] Out: 1005 [Urine:1005] Intake/Output from this shift: Total I/O In: 1392.5 [I.V.:630; NG/GT:225; IV Piggyback:537.5] Out: 275 [Urine:275]  Labs:  Recent Labs  10/21/13 0525 10/22/13 1242 10/23/13 0442  WBC 10.9*  --  8.0  HGB 10.0*  --  8.9*  PLT 264  --  202  CREATININE 0.67 0.45* 0.53   Estimated Creatinine Clearance: 82.2 ml/min (by C-G formula based on Cr of 0.53). No results found for this basename: VANCOTROUGH, Leodis Binet, VANCORANDOM, GENTTROUGH, GENTPEAK, GENTRANDOM, TOBRATROUGH, TOBRAPEAK, TOBRARND, AMIKACINPEAK, AMIKACINTROU, AMIKACIN,  in the last 72 hours    Anti-infectives   Start     Dose/Rate Route Frequency Ordered Stop   10/22/13 1300  levofloxacin (LEVAQUIN) IVPB 750 mg     750 mg 100 mL/hr over 90 Minutes Intravenous Every 24 hours 10/22/13 1143     10/21/13 1000  vancomycin (VANCOCIN) 1,500 mg in sodium chloride 0.9 % 500 mL IVPB     1,500 mg 250 mL/hr over 120 Minutes Intravenous Every 24 hours 2013/10/24 0907     10/24/13 1400  piperacillin-tazobactam (ZOSYN) IVPB 3.375 g     3.375 g 12.5 mL/hr over 240 Minutes Intravenous 3 times per day 10-24-2013 0908     10-24-2013 1200  fluconazole (DIFLUCAN) IVPB 100 mg     100 mg 50  mL/hr over 60 Minutes Intravenous Every 24 hours 10-24-2013 1039     2013-10-24 0915  vancomycin (VANCOCIN) 2,000 mg in sodium chloride 0.9 % 500 mL IVPB     2,000 mg 250 mL/hr over 120 Minutes Intravenous  Once 10/24/13 0902 2013/10/24 1200   10/24/2013 0730  vancomycin (VANCOCIN) 1,250 mg in sodium chloride 0.9 % 250 mL IVPB  Status:  Discontinued     1,250 mg 166.7 mL/hr over 90 Minutes Intravenous  Once 2013/10/24 0722 10-24-13 0902   10-24-13 0730  piperacillin-tazobactam (ZOSYN) IVPB 3.375 g     3.375 g 12.5 mL/hr over 240 Minutes Intravenous  Once 24-Oct-2013 0722 10/24/2013 0905      Assessment: 68 yo female with HCAP and 2/2 strep pneumo in the blood. White count normal, afebrile, procalcitonin elevated.  Vancomycin trough returned therapeutic, however vancomycin is now discontinued.  Vanc 9/11> 9/14 Zosyn 9/11> fluc 9/11>> 9/14 levaquin 9/13 >>  9/11 BCx: 2/2 strep PN (sens LQ, intermediate PCN, resistant to rocephin) 9/11 resp: pseudomonas  9/11 urine cs: >100K colonies, no predominant organism   Goal of Therapy:  Resolution of infection   Plan:  Continue zosyn 3.375 g IV q8h Continue levaquin 750 mg q24h F/u sensitivities to pseudomonas in TA    Agapito Games, PharmD, BCPS Clinical Pharmacist Pager: [phone] 10/23/2013 2:35 PM

## 2013-10-23 NOTE — Progress Notes (Signed)
Bilateral upper extremity venous duplex completed:  No evidence of DVT or superficial thrombosis.    

## 2013-10-24 ENCOUNTER — Inpatient Hospital Stay (HOSPITAL_COMMUNITY): Payer: Medicare Other

## 2013-10-24 LAB — BLOOD GAS, ARTERIAL
ACID-BASE EXCESS: 5.5 mmol/L — AB (ref 0.0–2.0)
Bicarbonate: 32 mEq/L — ABNORMAL HIGH (ref 20.0–24.0)
Drawn by: 39866
FIO2: 1 %
O2 SAT: 99.5 %
PATIENT TEMPERATURE: 99
PEEP: 20 cmH2O
PH ART: 7.271 — AB (ref 7.350–7.450)
RATE: 35 resp/min
TCO2: 34.2 mmol/L (ref 0–100)
VT: 400 mL
pCO2 arterial: 72.2 mmHg (ref 35.0–45.0)
pO2, Arterial: 169 mmHg — ABNORMAL HIGH (ref 80.0–100.0)

## 2013-10-24 LAB — GLUCOSE, CAPILLARY
GLUCOSE-CAPILLARY: 266 mg/dL — AB (ref 70–99)
GLUCOSE-CAPILLARY: 261 mg/dL — AB (ref 70–99)
GLUCOSE-CAPILLARY: 207 mg/dL — AB (ref 70–99)
Glucose-Capillary: 187 mg/dL — ABNORMAL HIGH (ref 70–99)
Glucose-Capillary: 228 mg/dL — ABNORMAL HIGH (ref 70–99)
Glucose-Capillary: 281 mg/dL — ABNORMAL HIGH (ref 70–99)

## 2013-10-24 LAB — BASIC METABOLIC PANEL
ANION GAP: 10 (ref 5–15)
BUN: 54 mg/dL — ABNORMAL HIGH (ref 6–23)
CO2: 33 meq/L — AB (ref 19–32)
CREATININE: 0.52 mg/dL (ref 0.50–1.10)
Calcium: 8.3 mg/dL — ABNORMAL LOW (ref 8.4–10.5)
Chloride: 99 mEq/L (ref 96–112)
GFR calc non Af Amer: >90 mL/min (ref >90)
Glucose, Bld: 258 mg/dL — ABNORMAL HIGH (ref 70–99)
Potassium: 3.6 mEq/L — ABNORMAL LOW (ref 3.7–5.3)
Sodium: 142 mEq/L (ref 137–147)

## 2013-10-24 LAB — CULTURE, RESPIRATORY

## 2013-10-24 LAB — CBC
HCT: 28.2 % — ABNORMAL LOW (ref 36.0–46.0)
Hemoglobin: 8.6 g/dL — ABNORMAL LOW (ref 12.0–15.0)
MCH: 29.5 pg (ref 26.0–34.0)
MCHC: 30.5 g/dL (ref 30.0–36.0)
MCV: 96.6 fL (ref 78.0–100.0)
Platelets: 197 10*3/uL (ref 150–400)
RBC: 2.92 MIL/uL — ABNORMAL LOW (ref 3.87–5.11)
RDW: 16 % — AB (ref 11.5–15.5)
WBC: 7.1 10*3/uL (ref 4.0–10.5)

## 2013-10-24 LAB — POCT I-STAT 3, ART BLOOD GAS (G3+)
Acid-Base Excess: 10 mmol/L — ABNORMAL HIGH (ref 0.0–2.0)
BICARBONATE: 37.4 meq/L — AB (ref 20.0–24.0)
O2 SAT: 100 %
PCO2 ART: 65.2 mmHg — AB (ref 35.0–45.0)
PO2 ART: 187 mmHg — AB (ref 80.0–100.0)
Patient temperature: 98.6
TCO2: 39 mmol/L (ref 0–100)
pH, Arterial: 7.367 (ref 7.350–7.450)

## 2013-10-24 LAB — CULTURE, RESPIRATORY W GRAM STAIN

## 2013-10-24 MED ORDER — METOPROLOL TARTRATE 25 MG/10 ML ORAL SUSPENSION
25.0000 mg | Freq: Two times a day (BID) | ORAL | Status: DC
  Administered 2013-10-24 – 2013-10-26 (×5): 25 mg
  Filled 2013-10-24 (×6): qty 10

## 2013-10-24 MED ORDER — ETOMIDATE 2 MG/ML IV SOLN
INTRAVENOUS | Status: AC
  Administered 2013-10-24: 5 mg
  Filled 2013-10-24: qty 10

## 2013-10-24 MED ORDER — POTASSIUM CHLORIDE 20 MEQ/15ML (10%) PO LIQD
40.0000 meq | Freq: Once | ORAL | Status: AC
  Administered 2013-10-24: 40 meq via ORAL
  Filled 2013-10-24: qty 30

## 2013-10-24 MED ORDER — SODIUM CHLORIDE 0.9 % IV SOLN
INTRAVENOUS | Status: DC
  Administered 2013-10-24: 20 mL/h via INTRAVENOUS

## 2013-10-24 MED ORDER — METOPROLOL TARTRATE 1 MG/ML IV SOLN: 5.0000 mg | INTRAVENOUS | Status: DC | PRN

## 2013-10-24 MED ORDER — FUROSEMIDE 10 MG/ML IJ SOLN: 40.0000 mg | Freq: Once | INTRAMUSCULAR | Status: DC

## 2013-10-24 MED ORDER — ACETYLCYSTEINE 20 % IN SOLN
4.0000 mL | Freq: Two times a day (BID) | RESPIRATORY_TRACT | Status: DC
  Administered 2013-10-24 – 2013-10-29 (×11): 4 mL via RESPIRATORY_TRACT
  Filled 2013-10-24 (×15): qty 4

## 2013-10-24 MED ORDER — FUROSEMIDE 10 MG/ML IJ SOLN
40.0000 mg | Freq: Three times a day (TID) | INTRAMUSCULAR | Status: DC
  Administered 2013-10-24 – 2013-10-27 (×10): 40 mg via INTRAVENOUS
  Filled 2013-10-24 (×15): qty 4

## 2013-10-24 MED ORDER — INSULIN GLARGINE 100 UNIT/ML ~~LOC~~ SOLN
40.0000 [IU] | Freq: Every day | SUBCUTANEOUS | Status: DC
  Administered 2013-10-24 – 2013-10-25 (×2): 40 [IU] via SUBCUTANEOUS
  Filled 2013-10-24 (×3): qty 0.4

## 2013-10-24 NOTE — Progress Notes (Signed)
  Aline fell off   Plan Monitor off aline  Dr. Mashayla Lavin,[facility].C.P Pulmonary and Critical Care Medicine Staff Physician [facility] [place] [facility] Pager: [phone], If no answer or between  15:00h - 7:00h: call [phone]  10/24/2013 9:56 PM

## 2013-10-24 NOTE — Procedures (Signed)
Bronchoscopy Procedure Note [name] [id number] [birth year]  Procedure: Bronchoscopy Indications: Remove secretions  Procedure Details Consent: Risks of procedure as well as the alternatives and risks of each were explained to the (patient/caregiver).  Consent for procedure obtained. Time Out: Verified patient identification, verified procedure, site/side was marked, verified correct patient position, special equipment/implants available, medications/allergies/relevent history reviewed, required imaging and test results available.  Performed  In preparation for procedure, patient was given 100% FiO2 and bronchoscope lubricated. Sedation: Etomidate  Airway entered and the following bronchi were examined: RUL, RML, RLL, LUL, LLL and Bronchi.   Procedures performed: Brushings performed Bronchoscope removed.  , Patient placed back on 100% FiO2 at conclusion of procedure.    Evaluation Hemodynamic Status: BP stable throughout; O2 sats: transiently fell during during procedure Patient's Current Condition: stable Specimens:  None Complications: No apparent complications Patient did tolerate procedure well.   Shelly Bucks. 10/24/2013   Had mucous plugged last night with major desat Peep to 12 to reduce MAP when scope enters   1. Tracheal malacia moderate 2. No major mucous plugging 3. Mild thick pus left main  Suctioned  desat 61%, scope out  Mcarthur Rossetti. Tyson Alias, MD, FACP Pgr: [phone] [facility]

## 2013-10-24 NOTE — Progress Notes (Signed)
PULMONARY / CRITICAL CARE MEDICINE   Name: [name] MRN: [id number] DOB: [birth year]    ADMISSION DATE:  21-Oct-2013  REFERRING MD :  EDP  CHIEF COMPLAINT:  PNA, Respiratory Failure   INITIAL PRESENTATION: 68 y/o F, chronic trach/vent patient, admitted to [facility] 9/11 for L PNA.  Baseline ATC during day, vent QHS.    STUDIES:  9/11  CT Chest > trach in good position, diffuse consolidation on L, patchy infiltrates RLL, Flattening of main stem bronchi noted  SIGNIFICANT EVENTS: 9/11  Admit with L HCAP, UTI 9/13 worsening resp status, requiring vent 24/7, tx ICU 9/15 mucous plug, desat   SUBJECTIVE:  Per nursing, pt had period of desat into the 60's last night. She was bagged for 20 minutes before sats returned to 80's.   Required increased FiO2 to 100% and increased PEEP to 20.  VITAL SIGNS: Temp:  [98.6 F (37 C)-99.9 F (37.7 C)] 98.6 F (37 C) (09/15 0700) Pulse Rate:  [76-110] 92 (09/15 0823) Resp:  [17-35] 35 (09/15 0823) BP: (90-138)/(45-70) 122/70 mmHg (09/15 0823) SpO2:  [89 %-100 %] 100 % (09/15 0823) Arterial Line BP: (100-139)/(48-64) 128/64 mmHg (09/15 0800) FiO2 (%):  [70 %-100 %] 100 % (09/15 0824) Weight:  [262 lb 9.1 oz (119.1 kg)] 262 lb 9.1 oz (119.1 kg) (09/15 0557)  HEMODYNAMICS:    VENTILATOR SETTINGS: Vent Mode:  [-] PRVC FiO2 (%):  [70 %-100 %] 100 % Set Rate:  [35 bmp] 35 bmp Vt Set:  [400 mL] 400 mL PEEP:  [14 cmH20-20 cmH20] 20 cmH20 Plateau Pressure:  [32 cmH20-48 cmH20] 47 cmH20  INTAKE / OUTPUT:  Intake/Output Summary (Last 24 hours) at 10/24/13 4098 Last data filed at 10/24/13 1191  Gross per 24 hour  Intake 3006.37 ml  Output   1290 ml  Net 1716.37 ml    PHYSICAL EXAMINATION: General:  Chronically ill adult female in NAD Neuro:  Awake, alert, generalized weakness HEENT:  Mm pink/moist, poor dentition, #6 XLT trach midline c/d/i Cardiovascular:  afib with rvr Lungs:  resp's coarse, even/non-labored on vent, lungs bilaterally  diminished L>R,   Abdomen:  Round/soft, bsx4 active, PEG site fine Musculoskeletal:  No acute deformities, RUE swelling Skin:  Warm/dry, thin skin, multiple bruises / tears to upper extremities   LABS:  CBC  Recent Labs Lab 10/21/13 0525 10/23/13 0442 10/24/13 0515  WBC 10.9* 8.0 7.1  HGB 10.0* 8.9* 8.6*  HCT 32.9* 29.8* 28.2*  PLT 264 202 197   Coag's  No results found for this basename: APTT, INR,  in the last 168 hours  BMET  Recent Labs Lab 10/22/13 1242 10/23/13 0442 10/24/13 0515  NA 142 141 142  K 2.9* 3.7 3.6*  CL 98 99 99  CO2 34* 30 33*  BUN 42* 51* 54*  CREATININE 0.45* 0.53 0.52  GLUCOSE 276* 371* 258*   Electrolytes  Recent Labs Lab 10/22/13 1242 10/23/13 0442 10/24/13 0515  CALCIUM 9.1 8.0* 8.3*   Sepsis Markers  Recent Labs Lab 21-Oct-2013 1224  10/22/13 0715 10/22/13 1242 10/23/13 0442  LATICACIDVEN 3.5*  --   --   --   --   PROCALCITON 29.93  < > 16.07 14.23 11.99  < > = values in this interval not displayed.  ABG  Recent Labs Lab 10/23/13 0450 10/24/13 0700 10/24/13 0831  PHART 7.342* 7.271* 7.367  PCO2ART 60.1* 72.2* 65.2*  PO2ART 172.0* 169.0* 187.0*    Liver Enzymes  Recent Labs Lab 2013-10-21 0558  AST  26  ALT 36*  ALKPHOS 89  BILITOT 0.3  ALBUMIN 2.7*   Cardiac Enzymes  Recent Labs Lab 11/15/13 0558 10/21/13 0525  PROBNP 2233.0* 2401.0*   Glucose  Recent Labs Lab 10/23/13 1239 10/23/13 1534 10/23/13 2037 10/24/13 0017 10/24/13 0402 10/24/13 0800  GLUCAP 283* 277* 290* 281* 266* 187*    Imaging No results found.   ASSESSMENT / PLAN:  PULMONARY Chronic Trach #6 XLT A: Acute on Chronic Respiratory Failure - r/t worsening HCAP, pleural effusion, OHS/OSA HCAP OSA ARDS P:   Cont vent bundle ARDS protocol ABG reviewed, reduce fio2 now, consider peep reduction if successful to 60% Keep same TV, want to reduce rate when able Add mucomist due to possible mucous plugging abg in am  Keep  plat less 30 May require bronch given significant mucous plugging Chest pt Lasix to neg balance  CARDIOVASCULAR R IJ CVL 9/13 >>  A:  CAD HLD Afib with RVR (possibly due to hypokalemia) P:   EKG now for rhythm  Beta blocker addition tube Monitor lytes and correct DNR if arrests  RENAL A:   Severe hypervolemia/anasarca Hypokalemia (3.6) ARDS P:   Monitor BMET intermittently Monitor I/Os Correct electrolytes as indicated (KCl) Lasix to neg balance Dc free water Not a candidate for HD Kvo, cvp up  GASTROINTESTINAL A:   GERD Dysphagia due to trach tube P:   SUP: enteral famotidine Cont TFs  HEMATOLOGIC Anemia - likely of chronic disease  P:  DVT px: SQ heparin Monitor CBC intermittently Transfuse per usual ICU guidelines  INFECTIOUS A:   Pseudomonas HCAP Yeast UTI  Strep pneumo bacteremia  Severe sepsis Elevated PCT- improving P:   BCx2 9/11 >>2/2 strep pneumo (RES ceftriaxone, PCN, sens levaquin) UC 9/11 >> multpile organisms Sputum 9/11 >> pseudomonas  Abx: Vanco 9/11 >> 9/14 Diflucan 9/11 >> 9/14 Zosyn, 9/11 >>  Levaquin 9/13>>   Narrow zosyn once pseudomonas sens noted Goal wil be monotherapy  ENDOCRINE A:   DM 2 Hypothyroidism  P:   Continue Synthroid Cont SSI, resistant scale Cont Lantus and increase  NEUROLOGIC A:   Chronic Pain / Fibromyalgia Osteoarthritis  P:   RASS goal: -2 as vent setting improve Continue home klonopin, zoloft Hold home fentanyl patch fent drip to remain WUA   Beckey Rutter, PA-S  [facility] 10/24/13   Ccm time 30 min  I have fully examined this patient and agree with above findings.    And edited infull  Mcarthur Rossetti. Tyson Alias, MD, FACP Pgr: [phone] [place] [facility]

## 2013-10-25 ENCOUNTER — Inpatient Hospital Stay (HOSPITAL_COMMUNITY): Payer: Medicare Other

## 2013-10-25 DIAGNOSIS — N3000 Acute cystitis without hematuria: Secondary | ICD-10-CM

## 2013-10-25 DIAGNOSIS — IMO0002 Reserved for concepts with insufficient information to code with codable children: Secondary | ICD-10-CM

## 2013-10-25 LAB — CBC
HCT: 28.5 % — ABNORMAL LOW (ref 36.0–46.0)
HEMOGLOBIN: 8.7 g/dL — AB (ref 12.0–15.0)
MCH: 29.4 pg (ref 26.0–34.0)
MCHC: 30.5 g/dL (ref 30.0–36.0)
MCV: 96.3 fL (ref 78.0–100.0)
PLATELETS: 215 10*3/uL (ref 150–400)
RBC: 2.96 MIL/uL — AB (ref 3.87–5.11)
RDW: 16.1 % — ABNORMAL HIGH (ref 11.5–15.5)
WBC: 8.9 10*3/uL (ref 4.0–10.5)

## 2013-10-25 LAB — GLUCOSE, CAPILLARY
GLUCOSE-CAPILLARY: 165 mg/dL — AB (ref 70–99)
GLUCOSE-CAPILLARY: 192 mg/dL — AB (ref 70–99)
Glucose-Capillary: 177 mg/dL — ABNORMAL HIGH (ref 70–99)
Glucose-Capillary: 198 mg/dL — ABNORMAL HIGH (ref 70–99)
Glucose-Capillary: 213 mg/dL — ABNORMAL HIGH (ref 70–99)
Glucose-Capillary: 219 mg/dL — ABNORMAL HIGH (ref 70–99)

## 2013-10-25 LAB — BASIC METABOLIC PANEL
Anion gap: 14 (ref 5–15)
BUN: 60 mg/dL — AB (ref 6–23)
CHLORIDE: 97 meq/L (ref 96–112)
CO2: 35 mEq/L — ABNORMAL HIGH (ref 19–32)
Calcium: 8.7 mg/dL (ref 8.4–10.5)
Creatinine, Ser: 0.59 mg/dL (ref 0.50–1.10)
GFR calc non Af Amer: >90 mL/min (ref >90)
GLUCOSE: 168 mg/dL — AB (ref 70–99)
Potassium: 3.8 mEq/L (ref 3.7–5.3)
SODIUM: 146 meq/L (ref 137–147)

## 2013-10-25 MED ORDER — SODIUM CHLORIDE 0.9 % IV SOLN
500.0000 mg | Freq: Four times a day (QID) | INTRAVENOUS | Status: DC
  Filled 2013-10-25 (×3): qty 500

## 2013-10-25 MED ORDER — FREE WATER
300.0000 mL | Freq: Four times a day (QID) | Status: DC
  Administered 2013-10-25 (×2): 300 mL

## 2013-10-25 MED ORDER — DEXTROSE 5 % IV SOLN
2.0000 g | Freq: Three times a day (TID) | INTRAVENOUS | Status: DC
  Administered 2013-10-25 – 2013-10-29 (×12): 2 g via INTRAVENOUS
  Filled 2013-10-25 (×13): qty 2

## 2013-10-25 MED ORDER — SODIUM CHLORIDE 0.9 % IV SOLN
250.0000 mL | INTRAVENOUS | Status: DC | PRN
  Administered 2013-10-29 (×2): 250 mL via INTRAVENOUS

## 2013-10-25 MED ORDER — LEVOFLOXACIN IN D5W 750 MG/150ML IV SOLN
750.0000 mg | INTRAVENOUS | Status: DC
  Administered 2013-10-26 – 2013-10-28 (×3): 750 mg via INTRAVENOUS
  Filled 2013-10-25 (×4): qty 150

## 2013-10-25 NOTE — Progress Notes (Signed)
CVP not changed at this time. RN stated that they were no longer checking CVPs. Will monitor as needed

## 2013-10-25 NOTE — Progress Notes (Signed)
PULMONARY / CRITICAL CARE MEDICINE   Name: [name] MRN: [id number] DOB: [birth year]    ADMISSION DATE:  2013/11/02  REFERRING MD :  EDP  CHIEF COMPLAINT:  PNA, Respiratory Failure   INITIAL PRESENTATION: 68 y/o F, chronic trach/vent patient, admitted to [facility] 9/11 for L PNA.  Baseline ATC during day, vent QHS.    STUDIES:  9/11  CT Chest > trach in good position, diffuse consolidation on L, patchy infiltrates RLL, Flattening of main stem bronchi noted 9/15  Bronchoscopy- mod tracheal malacia, no major mucous plugging, mild thick pus L main (suctioned) 9/15  EKG-NSR, poor R wave progression  SIGNIFICANT EVENTS: 9/11  Admit with L HCAP, UTI 9/13 worsening resp status, requiring vent 24/7, tx ICU 9/15 mucous plug, desat 9/15 bronch, no major obsturction 9/16 - neg balance   SUBJECTIVE:  Per nursing, pt had a couple of periods of desaturation last night.  Relieved with deep suctioning.    VITAL SIGNS: Temp:  [98.3 F (36.8 C)-99.5 F (37.5 C)] 99.5 F (37.5 C) (09/16 0800) Pulse Rate:  [77-102] 100 (09/16 1000) Resp:  [31-35] 31 (09/16 1000) BP: (88-136)/(35-73) 102/47 mmHg (09/16 1000) SpO2:  [94 %-99 %] 96 % (09/16 1000) Arterial Line BP: (97-146)/(47-71) 114/56 mmHg (09/15 2100) FiO2 (%):  [60 %] 60 % (09/16 0903) Weight:  [256 lb 9.9 oz (116.4 kg)] 256 lb 9.9 oz (116.4 kg) (09/16 0400)  HEMODYNAMICS: CVP:  [12 mmHg-16 mmHg] 16 mmHg  VENTILATOR SETTINGS: Vent Mode:  [-] PRVC FiO2 (%):  [60 %] 60 % Set Rate:  [35 bmp] 35 bmp Vt Set:  [400 mL] 400 mL PEEP:  [18 cmH20] 18 cmH20 Plateau Pressure:  [41 cmH20-47 cmH20] 46 cmH20  INTAKE / OUTPUT:  Intake/Output Summary (Last 24 hours) at 10/25/13 1047 Last data filed at 10/25/13 1000  Gross per 24 hour  Intake   2075 ml  Output   4500 ml  Net  -2425 ml    PHYSICAL EXAMINATION: General:  Chronically ill adult female in NAD Neuro:  Awake, alert, generalized weakness HEENT:  Mm pink/moist, poor dentition, #6 XLT  trach midline c/d/i Cardiovascular: s1s2, RRR, no r/m/g Lungs:  resp's coarse, even/non-labored on vent Abdomen:  Round/soft, bsx4 active, PEG site fine Musculoskeletal:  No acute deformities, RUE, BLE swelling Skin:  Warm/dry, thin skin, multiple bruises / tears to upper extremities   LABS:  CBC  Recent Labs Lab 10/23/13 0442 10/24/13 0515 10/25/13 0433  WBC 8.0 7.1 8.9  HGB 8.9* 8.6* 8.7*  HCT 29.8* 28.2* 28.5*  PLT 202 197 215   Coag's  No results found for this basename: APTT, INR,  in the last 168 hours  BMET  Recent Labs Lab 10/23/13 0442 10/24/13 0515 10/25/13 0433  NA 141 142 146  K 3.7 3.6* 3.8  CL 99 99 97  CO2 30 33* 35*  BUN 51* 54* 60*  CREATININE 0.53 0.52 0.59  GLUCOSE 371* 258* 168*   Electrolytes  Recent Labs Lab 10/23/13 0442 10/24/13 0515 10/25/13 0433  CALCIUM 8.0* 8.3* 8.7   Sepsis Markers  Recent Labs Lab 2013/11/02 1224  10/22/13 0715 10/22/13 1242 10/23/13 0442  LATICACIDVEN 3.5*  --   --   --   --   PROCALCITON 29.93  < > 16.07 14.23 11.99  < > = values in this interval not displayed.  ABG  Recent Labs Lab 10/23/13 0450 10/24/13 0700 10/24/13 0831  PHART 7.342* 7.271* 7.367  PCO2ART 60.1* 72.2* 65.2*  PO2ART  172.0* 169.0* 187.0*    Liver Enzymes  Recent Labs Lab 10/21/13 0558  AST 26  ALT 36*  ALKPHOS 89  BILITOT 0.3  ALBUMIN 2.7*   Cardiac Enzymes  Recent Labs Lab 21-Oct-2013 0558 10/21/13 0525  PROBNP 2233.0* 2401.0*   Glucose  Recent Labs Lab 10/24/13 1138 10/24/13 1702 10/24/13 2035 10/24/13 2341 10/25/13 0416 10/25/13 0811  GLUCAP 228* 261* 207* 177* 165* 192*    Imaging Dg Chest Port 1 View  10/24/2013   CLINICAL DATA:  Respiratory failure  EXAM: PORTABLE CHEST - 1 VIEW  COMPARISON:  October 22, 2013  FINDINGS: Endotracheal tube tip is 4.7 cm above the carina. Central catheter tip is in the superior vena cava. No pneumothorax. There is diffuse interstitial and alveolar edema. There is  cardiomegaly with pulmonary venous hypertension. No adenopathy is appreciable.  IMPRESSION: Tube and catheter positions as described without pneumothorax. The appearance of the lungs is consistent with ARDS. There may well be a degree of superimposed congestive heart failure and/or pneumonia. The appearance is essentially stable compared to most recent prior study.   Electronically Signed   By: Bretta Bang M.D.   On: 10/24/2013 07:24     ASSESSMENT / PLAN:  PULMONARY Chronic Trach #6 XLT A: Acute on Chronic Respiratory Failure - r/t worsening HCAP, pleural effusion, OHS/OSA HCAP OSA ARDS P:   Cont vent bundle ARDS protocol ABG reviewed, reduce fio2 now, consider peep reduction if successful to 60%, then top peep 15 if able Keep same TV, want to reduce rate when able - abg in am  Mucomyst helpful Keep plat less 30 Chest pt helpful Lasix to neg balance successful maintain and assess peep reduction  CARDIOVASCULAR R IJ CVL 9/13 >>  A:  CAD HLD Afib with RVR (possibly due to hypokalemia) P:   Beta blocker addition tube - maintain  Monitor lytes and correct DNR if arrests diuresis  RENAL A:   Severe hypervolemia/anasarca Hypokalemia-improved ARDS P:   Monitor BMET intermittently Monitor I/Os Correct electrolytes as indicated Lasix to neg balance, add free water, maintain same lasix dosage Not a candidate for HD Kvo  GASTROINTESTINAL A:   GERD Dysphagia due to trach tube P:   SUP: enteral famotidine Cont TFs  HEMATOLOGIC Anemia - likely of chronic disease  P:  DVT px: SQ heparin Monitor CBC intermittently Transfuse per usual ICU guidelines  INFECTIOUS A:   Pseudomonas HCAP Yeast UTI  Strep pneumo bacteremia  Severe sepsis Elevated PCT- improving P:   BCx2 9/11 >>2/2 strep pneumo (RES ceftriaxone, PCN, sens levaquin) UC 9/11 >> multpile organisms Sputum 9/11 >> pseudomonas  Abx: Vanco 9/11 >> 9/14 Diflucan 9/11 >> 9/14 Zosyn, 9/11  >>9/16 Levaquin 9/13>> overlap for now IMipenem 9/16>>  Place disc ceftaz , will call micro  ENDOCRINE A:   DM 2 Hypothyroidism  P:   Continue Synthroid Cont SSI, resistant scale Cont Lantus and increase to 45  NEUROLOGIC A:   Chronic Pain / Fibromyalgia Osteoarthritis  P:   RASS goal: -2 as vent setting improve Continue home klonopin, zoloft Hold home fentanyl patch fent drip to remain Will discuss with husband option for comofrt WUA  Beckey Rutter, PA-S  [facility] 10/25/13  Ccm time 30 min   I have fully examined this patient and agree with above findings.      Mcarthur Rossetti. Tyson Alias, MD, FACP Pgr: [phone] [place] [facility]

## 2013-10-26 ENCOUNTER — Inpatient Hospital Stay (HOSPITAL_COMMUNITY): Payer: Medicare Other

## 2013-10-26 LAB — GLUCOSE, CAPILLARY
GLUCOSE-CAPILLARY: 283 mg/dL — AB (ref 70–99)
GLUCOSE-CAPILLARY: 266 mg/dL — AB (ref 70–99)
GLUCOSE-CAPILLARY: 304 mg/dL — AB (ref 70–99)
Glucose-Capillary: 237 mg/dL — ABNORMAL HIGH (ref 70–99)
Glucose-Capillary: 281 mg/dL — ABNORMAL HIGH (ref 70–99)
Glucose-Capillary: 304 mg/dL — ABNORMAL HIGH (ref 70–99)

## 2013-10-26 LAB — BASIC METABOLIC PANEL
Anion gap: 14 (ref 5–15)
BUN: 57 mg/dL — ABNORMAL HIGH (ref 6–23)
CALCIUM: 8.8 mg/dL (ref 8.4–10.5)
CO2: 36 mEq/L — ABNORMAL HIGH (ref 19–32)
Chloride: 92 mEq/L — ABNORMAL LOW (ref 96–112)
Creatinine, Ser: 0.59 mg/dL (ref 0.50–1.10)
GFR calc Af Amer: >90 mL/min (ref >90)
GFR calc non Af Amer: >90 mL/min (ref >90)
GLUCOSE: 289 mg/dL — AB (ref 70–99)
Potassium: 3 mEq/L — ABNORMAL LOW (ref 3.7–5.3)
Sodium: 142 mEq/L (ref 137–147)

## 2013-10-26 LAB — PHOSPHORUS: PHOSPHORUS: 2.3 mg/dL (ref 2.3–4.6)

## 2013-10-26 LAB — MAGNESIUM: MAGNESIUM: 1.8 mg/dL (ref 1.5–2.5)

## 2013-10-26 MED ORDER — ACETAMINOPHEN 160 MG/5ML PO SOLN
650.0000 mg | Freq: Four times a day (QID) | ORAL | Status: DC | PRN
  Administered 2013-10-26 – 2013-10-29 (×2): 650 mg via ORAL
  Filled 2013-10-26 (×2): qty 20.3

## 2013-10-26 MED ORDER — FREE WATER
200.0000 mL | Freq: Four times a day (QID) | Status: DC
  Administered 2013-10-26 (×2): 200 mL

## 2013-10-26 MED ORDER — POLYETHYLENE GLYCOL 3350 17 G PO PACK
17.0000 g | PACK | Freq: Every day | ORAL | Status: DC | PRN
  Filled 2013-10-26: qty 1

## 2013-10-26 MED ORDER — FREE WATER
200.0000 mL | Freq: Two times a day (BID) | Status: DC
  Administered 2013-10-26 – 2013-10-27 (×2): 200 mL

## 2013-10-26 MED ORDER — DOCUSATE SODIUM 50 MG/5ML PO LIQD
100.0000 mg | Freq: Two times a day (BID) | ORAL | Status: DC
  Administered 2013-10-26 – 2013-10-28 (×4): 100 mg via ORAL
  Filled 2013-10-26 (×8): qty 10

## 2013-10-26 MED ORDER — INSULIN GLARGINE 100 UNIT/ML ~~LOC~~ SOLN
50.0000 [IU] | Freq: Every day | SUBCUTANEOUS | Status: DC
  Administered 2013-10-26: 50 [IU] via SUBCUTANEOUS
  Filled 2013-10-26 (×2): qty 0.5

## 2013-10-26 MED ORDER — POLYETHYLENE GLYCOL 3350 17 G PO PACK
17.0000 g | PACK | Freq: Once | ORAL | Status: AC
  Administered 2013-10-26: 17 g via ORAL
  Filled 2013-10-26: qty 1

## 2013-10-26 MED ORDER — POTASSIUM CHLORIDE 10 MEQ/50ML IV SOLN
10.0000 meq | INTRAVENOUS | Status: AC
  Administered 2013-10-26 (×4): 10 meq via INTRAVENOUS
  Filled 2013-10-26: qty 50

## 2013-10-26 NOTE — Progress Notes (Signed)
Pt did not tolerate CPT (increase in 02 requirement). Terminated cpt, and pt is at 96%, comfortable, and c/d.

## 2013-10-26 NOTE — Progress Notes (Signed)
PULMONARY / CRITICAL CARE MEDICINE   Name: [name] MRN: [id number] DOB: 01-Dec-1945    ADMISSION DATE:  10/26/2013  REFERRING MD :  EDP  CHIEF COMPLAINT:  PNA, Respiratory Failure   INITIAL PRESENTATION: 68 y/o F, chronic trach/vent patient, admitted to [facility] 9/11 for L PNA.  Baseline ATC during day, vent QHS.    STUDIES:  9/11  CT Chest > trach in good position, diffuse consolidation on L, patchy infiltrates RLL, Flattening of main stem bronchi noted 9/15  Bronchoscopy- mod tracheal malacia, no major mucous plugging, mild thick pus L main (suctioned) 9/15  EKG-NSR, poor R wave progression  SIGNIFICANT EVENTS: 9/11  Admit with L HCAP, UTI 9/13 worsening resp status, requiring vent 24/7, tx ICU 9/15 mucous plug, desat 9/15 bronch, no major obsturction 9/16 - neg balance   SUBJECTIVE:  Per nursing,desats with turning in bed, elevated temp and HR, giving tylenol PRN  VITAL SIGNS: Temp:  [99.1 F (37.3 C)-102 F (38.9 C)] 100.6 F (38.1 C) (09/17 0836) Pulse Rate:  [99-128] 107 (09/17 0800) Resp:  [28-35] 35 (09/17 0800) BP: (88-132)/(37-109) 102/47 mmHg (09/17 0800) SpO2:  [90 %-97 %] 92 % (09/17 0800) FiO2 (%):  [60 %] 60 % (09/17 0800) Weight:  [255 lb 15.3 oz (116.1 kg)] 255 lb 15.3 oz (116.1 kg) (09/17 0600)  HEMODYNAMICS:    VENTILATOR SETTINGS: Vent Mode:  [-] PRVC FiO2 (%):  [60 %] 60 % Set Rate:  [35 bmp] 35 bmp Vt Set:  [400 mL] 400 mL PEEP:  [18 cmH20] 18 cmH20 Plateau Pressure:  [40 cmH20-47 cmH20] 43 cmH20  INTAKE / OUTPUT:  Intake/Output Summary (Last 24 hours) at 10/26/13 0911 Last data filed at 10/26/13 0800  Gross per 24 hour  Intake   2235 ml  Output   3460 ml  Net  -1225 ml    PHYSICAL EXAMINATION: General:  Chronically ill adult female in NAD Neuro: drowsy HEENT:  Mm pink/moist, poor dentition, #6 XLT trach midline c/d/i Cardiovascular: s1s2, RRR, no r/m/g Lungs:  resp's coarse, even/non-labored on vent Abdomen:  Round/soft, bsx4  active, PEG site fine Musculoskeletal:  No acute deformities, RUE, BLE swelling Skin:  Hot/dry, thin skin, multiple bruises / tears to upper extremities   LABS:  CBC  Recent Labs Lab 10/23/13 0442 10/24/13 0515 10/25/13 0433  WBC 8.0 7.1 8.9  HGB 8.9* 8.6* 8.7*  HCT 29.8* 28.2* 28.5*  PLT 202 197 215   Coag's  No results found for this basename: APTT, INR,  in the last 168 hours  BMET  Recent Labs Lab 10/24/13 0515 10/25/13 0433 10/26/13 0415  NA 142 146 142  K 3.6* 3.8 3.0*  CL 99 97 92*  CO2 33* 35* 36*  BUN 54* 60* 57*  CREATININE 0.52 0.59 0.59  GLUCOSE 258* 168* 289*   Electrolytes  Recent Labs Lab 10/24/13 0515 10/25/13 0433 10/26/13 0415  CALCIUM 8.3* 8.7 8.8  MG  --   --  1.8  PHOS  --   --  2.3   Sepsis Markers  Recent Labs Lab 10/27/2013 1224  10/22/13 0715 10/22/13 1242 10/23/13 0442  LATICACIDVEN 3.5*  --   --   --   --   PROCALCITON 29.93  < > 16.07 14.23 11.99  < > = values in this interval not displayed.  ABG  Recent Labs Lab 10/23/13 0450 10/24/13 0700 10/24/13 0831  PHART 7.342* 7.271* 7.367  PCO2ART 60.1* 72.2* 65.2*  PO2ART 172.0* 169.0* 187.0*  Liver Enzymes  Recent Labs Lab Nov 02, 2013 0558  AST 26  ALT 36*  ALKPHOS 89  BILITOT 0.3  ALBUMIN 2.7*   Cardiac Enzymes  Recent Labs Lab 11/02/13 0558 10/21/13 0525  PROBNP 2233.0* 2401.0*   Glucose  Recent Labs Lab 10/25/13 1141 10/25/13 1551 10/25/13 1919 10/26/13 0022 10/26/13 0353 10/26/13 0803  GLUCAP 213* 198* 219* 281* 304* 283*    Imaging Dg Chest Port 1 View  10/25/2013   CLINICAL DATA:  Respiratory failure, shortness of breath  EXAM: PORTABLE CHEST - 1 VIEW  COMPARISON:  Portable chest x-ray of 10/24/2013  FINDINGS: There is little change in diffuse airspace disease consistent with edema, pneumonia, or ARDS. Tracheostomy is unchanged in position and heart size is stable  IMPRESSION: Little change in diffuse airspace disease.   Electronically  Signed   By: Dwyane Dee M.D.   On: 10/25/2013 08:14     ASSESSMENT / PLAN:  PULMONARY Chronic Trach #6 XLT A: Acute on Chronic Respiratory Failure - r/t worsening HCAP, pleural effusion, OHS/OSA HCAP OSA ARDS P:   Cont vent bundle ARDS protocol Goal to peep 12 if able, may need to abort ARDs protocol if fixed shunt noted Keep same TV, want to reduce rate when able - abg in am  Keep plat less 30 Lasix to neg balance successful maintain and assess peep reduction, did go to 16 May have a fixed shunt at some point, may need to reduce peep and observe  CARDIOVASCULAR R IJ CVL 9/13 >>  A:  CAD HLD Afib with RVR (possibly due to hypokalemia) Tachycardia hypotension P:   Beta blocker addition tube (hold) Monitor lytes and correct DNR if arrests Diuresis to continue, is the priority over BB adminsiatrtion  RENAL A:   Severe hypervolemia/anasarca Hypokalemia ARDS P:   KCl q h x 5 per [facility] Monitor BMET intermittently Monitor I/Os Correct electrolytes as indicated Lasix to neg balance, successful, maintain , and reduce free water  Not a candidate for HD KVO  GASTROINTESTINAL A:   GERD Dysphagia due to trach tube P:   SUP: enteral famotidine Cont TFs assess last BM, need BM, start colace, myrlax  HEMATOLOGIC Anemia - likely of chronic disease  P:  DVT px: SQ heparin Limit phlebotomy  INFECTIOUS A:   Pseudomonas HCAP Yeast UTI  Strep pneumo bacteremia  Severe sepsis Elevated PCT- improving P:   BCx2 9/11 >>2/2 strep pneumo (RES ceftriaxone, PCN, sens levaquin) UC 9/11 >> multpile organisms Sputum 9/11 >> pseudomonas  Abx: Vanco 9/11 >> 9/14 Diflucan 9/11 >> 9/14 Zosyn, 9/11 >>9/16 Levaquin 9/13>>> (for strep) add stop date 10 days 20th IMipenem 9/16>>9/16 ceftaz 9/16>>>plan 14 days   ENDOCRINE A:   DM 2 Hypothyroidism  P:   Continue Synthroid Cont SSI, resistant scale Cont Lantus and increase to 50  NEUROLOGIC A:   Chronic  Pain / Fibromyalgia Osteoarthritis  P:   RASS goal: -2 as vent setting improve Continue home klonopin, zoloft Hold home fentanyl patch fent drip to remain, when peep to 10 , consider to int Will discuss with husband option for comfort today WUA  Beckey Rutter, PA-S  [facility] 10/26/13  Ccm time 30 min   I have fully examined this patient and agree with above findings.     Mcarthur Rossetti. Tyson Alias, MD, FACP Pgr: [phone] [place] [facility]

## 2013-10-26 NOTE — Progress Notes (Signed)
[facility] ADULT ICU REPLACEMENT PROTOCOL FOR AM LAB REPLACEMENT ONLY  The patient does} apply for the [facility] Adult ICU Electrolyte Replacment Protocol based on the criteria listed below:   1. Is GFR >/= 40 ml/min? Yes.    Patient's GFR today is >90 2. Is urine output >/= 0.5 ml/kg/hr for the last 6 hours? Yes.   Patient's UOP is 1.62 ml/kg/hr 3. Is BUN < 60 mg/dL? Yes.    Patient's BUN today is 57 4. Abnormal electrolyte(s): K+ 3.0 5. Ordered repletion with: see order 6. If a panic level lab has been reported, has the CCM MD in charge been notified? Yes.  .   Physician:  PCCM  Ronal Fear A 10/26/2013 6:32 AM

## 2013-10-26 NOTE — Progress Notes (Signed)
Inpatient Diabetes Program Recommendations  AACE/ADA: New Consensus Statement on Inpatient Glycemic Control (2013)  Target Ranges:  Prepandial:   less than 140 mg/dL      Peak postprandial:   less than 180 mg/dL (1-2 hours)      Critically ill patients:  140 - 180 mg/dL   Results for [name], [name] (MRN [id number]) as of 10/26/2013 11:36  Ref. Range 10/24/2013 23:41 10/25/2013 04:16 10/25/2013 08:11 10/25/2013 11:41 10/25/2013 15:51 10/25/2013 19:19 10/26/2013 00:22 10/26/2013 03:53 10/26/2013 08:03  Glucose-Capillary Latest Range: 70-99 mg/dL 409 (H) 811 (H) 914 (H) 213 (H) 198 (H) 219 (H) 281 (H) 304 (H) 283 (H)   Diabetes history: DM2 Outpatient Diabetes medications: Lantus 45 units BID, Novolog 0-15 units Q6H Current orders for Inpatient glycemic control: Lantus 40 units QHS, Novolog 0-20 units Q4H  Inpatient Diabetes Program Recommendations Insulin - Basal: Please increase Lantus to 50 units QHS. Insulin - Meal Coverage: Please consider ordering Novolog 4 units Q4H for tube feeding coverage (in addition to Novolog correction scale).  Thanks, Orlando Penner, RN, MSN, CCRN Diabetes Coordinator Inpatient Diabetes Program [phone] (Team Pager) [phone] ([facility] office) [phone] [facility] office)

## 2013-10-26 NOTE — Progress Notes (Signed)
NUTRITION FOLLOW UP  Intervention:    Continue 31M PEPuP Protocol with Vital High Protein at goal rate of 45 ml/h (1080 ml per day) with Prostat 30 ml BID to provide 1280 kcals (64% of estimated needs), 124 gm protein (100% of estimated needs), 903 ml free water daily.  Nutrition Dx:   Inadequate oral intake related to limited appetite, dysphagia as evidenced by need for supplemental TF via PEG tube. Ongoing.  Goal:   Enteral nutrition to provide 60-70% of estimated calorie needs (22-25 kcals/kg ideal body weight) and 100% of estimated protein needs, based on ASPEN guidelines for hypocaloric high protein feeding in critically ill obese individuals, met.  Monitor:   TF tolerance/adequacy, weight trend, labs, vent status, overall goals of care.  Assessment:   68 y/o Female with PMH of fibromyalgia, osteoarthritis, DM II, GERD, CAD, OSA, frequent hospitalizations for respiratory failure resulting in chronic tracheostomy status requiring ATC during day and mechanical ventilation at night who presented to [facility] ER on 9/11 with a 24 hour hx of worsening shortness of breath.   As of the past week, nursing has had concerns for aspiration and have stopped feeding her orally. They have been supporting her via PEG. She was planned for a SLP swallowing evaluation but was unable to make the appointment due to current admission.   Discussed patient in ICU rounds today. MD plans to talk to family about overall goals of care soon. Per discussion with RN, patient is tolerating tube feedings well, no residuals.  Patient is currently intubated on ventilator support MV: 13.1 L/min Temp (24hrs), Avg:100.6 F (38.1 C), Min:99.1 F (37.3 C), Max:102 F (38.9 C)   Height: Ht Readings from Last 1 Encounters:  11/03/2013 5' (1.524 m)    Weight Status:  Up likely due to fluid status Wt Readings from Last 1 Encounters:  10/26/13 255 lb 15.3 oz (116.1 kg)  11/07/2013  230 lb (104.327 kg)   Re-estimated needs:   Kcal: 2000 Protein: 115-125 gm Fluid: 2 L  Skin: unstageable pressure ulcer to right heel; stage 3 to left and right buttocks  Diet Order: NPO   Intake/Output Summary (Last 24 hours) at 10/26/13 1534 Last data filed at 10/26/13 1400  Gross per 24 hour  Intake   2785 ml  Output   3680 ml  Net   -895 ml    Last BM: 9/11   Labs:   Recent Labs Lab 10/24/13 0515 10/25/13 0433 10/26/13 0415  NA 142 146 142  K 3.6* 3.8 3.0*  CL 99 97 92*  CO2 33* 35* 36*  BUN 54* 60* 57*  CREATININE 0.52 0.59 0.59  CALCIUM 8.3* 8.7 8.8  MG  --   --  1.8  PHOS  --   --  2.3  GLUCOSE 258* 168* 289*    CBG (last 3)   Recent Labs  10/26/13 0353 10/26/13 0803 10/26/13 1218  GLUCAP 304* 283* 266*    Scheduled Meds: . acetylcysteine  4 mL Nebulization BID  . antiseptic oral rinse  7 mL Mouth Rinse QID  . cefTAZidime (FORTAZ)  IV  2 g Intravenous 3 times per day  . chlorhexidine  15 mL Mouth Rinse BID  . clonazePAM  2 mg Per Tube BID  . docusate  100 mg Oral BID  . dorzolamide  1 drop Both Eyes BID  . famotidine  20 mg Per Tube BID  . feeding supplement (PRO-STAT SUGAR FREE 64)  30 mL Per Tube BID  .  free water  200 mL Per Tube BID  . furosemide  40 mg Intravenous 3 times per day  . heparin  5,000 Units Subcutaneous 3 times per day  . hyoscyamine  0.125 mg Sublingual BID  . insulin aspart  0-20 Units Subcutaneous 6 times per day  . insulin glargine  50 Units Subcutaneous QHS  . ipratropium-albuterol  3 mL Nebulization Q6H  . latanoprost  1 drop Both Eyes QHS  . levofloxacin (LEVAQUIN) IV  750 mg Intravenous Q24H  . levothyroxine  50 mcg Per Tube QAC breakfast  . polyvinyl alcohol  1 drop Both Eyes BID  . sertraline  50 mg Per Tube Daily    Continuous Infusions: . sodium chloride 20 mL/hr at 10/26/13 1400  . feeding supplement (VITAL HIGH PROTEIN) 1,000 mL (10/26/13 1400)  . fentaNYL infusion INTRAVENOUS 100 mcg/hr (10/26/13 1528)    Molli Barrows, RD, LDN,  [place] Pager [phone] After Hours Pager [phone]

## 2013-10-27 ENCOUNTER — Inpatient Hospital Stay (HOSPITAL_COMMUNITY): Payer: Medicare Other

## 2013-10-27 DIAGNOSIS — Z66 Do not resuscitate: Secondary | ICD-10-CM

## 2013-10-27 DIAGNOSIS — J189 Pneumonia, unspecified organism: Secondary | ICD-10-CM

## 2013-10-27 LAB — CBC
HCT: 27.8 % — ABNORMAL LOW (ref 36.0–46.0)
Hemoglobin: 8.4 g/dL — ABNORMAL LOW (ref 12.0–15.0)
MCH: 30 pg (ref 26.0–34.0)
MCHC: 30.2 g/dL (ref 30.0–36.0)
MCV: 99.3 fL (ref 78.0–100.0)
PLATELETS: 242 10*3/uL (ref 150–400)
RBC: 2.8 MIL/uL — ABNORMAL LOW (ref 3.87–5.11)
RDW: 16.2 % — AB (ref 11.5–15.5)
WBC: 15.8 10*3/uL — ABNORMAL HIGH (ref 4.0–10.5)

## 2013-10-27 LAB — GLUCOSE, CAPILLARY
GLUCOSE-CAPILLARY: 319 mg/dL — AB (ref 70–99)
Glucose-Capillary: 278 mg/dL — ABNORMAL HIGH (ref 70–99)
Glucose-Capillary: 279 mg/dL — ABNORMAL HIGH (ref 70–99)
Glucose-Capillary: 297 mg/dL — ABNORMAL HIGH (ref 70–99)
Glucose-Capillary: 299 mg/dL — ABNORMAL HIGH (ref 70–99)
Glucose-Capillary: 305 mg/dL — ABNORMAL HIGH (ref 70–99)

## 2013-10-27 LAB — URINE CULTURE

## 2013-10-27 LAB — BASIC METABOLIC PANEL
ANION GAP: 11 (ref 5–15)
Anion gap: 12 (ref 5–15)
BUN: 56 mg/dL — AB (ref 6–23)
BUN: 54 mg/dL — ABNORMAL HIGH (ref 6–23)
CHLORIDE: 92 meq/L — AB (ref 96–112)
CO2: 40 mEq/L (ref 19–32)
CO2: 39 mEq/L — ABNORMAL HIGH (ref 19–32)
CREATININE: 0.56 mg/dL (ref 0.50–1.10)
CREATININE: 0.58 mg/dL (ref 0.50–1.10)
Calcium: 8.9 mg/dL (ref 8.4–10.5)
Calcium: 8.8 mg/dL (ref 8.4–10.5)
Chloride: 90 mEq/L — ABNORMAL LOW (ref 96–112)
GFR calc Af Amer: >90 mL/min (ref >90)
GFR calc Af Amer: >90 mL/min (ref >90)
GLUCOSE: 323 mg/dL — AB (ref 70–99)
Glucose, Bld: 283 mg/dL — ABNORMAL HIGH (ref 70–99)
POTASSIUM: 3.5 meq/L — AB (ref 3.7–5.3)
Potassium: 2.9 mEq/L — CL (ref 3.7–5.3)
Sodium: 142 mEq/L (ref 137–147)
Sodium: 142 mEq/L (ref 137–147)

## 2013-10-27 MED ORDER — INSULIN GLARGINE 100 UNIT/ML ~~LOC~~ SOLN
30.0000 [IU] | Freq: Two times a day (BID) | SUBCUTANEOUS | Status: DC
  Administered 2013-10-27 – 2013-10-28 (×3): 30 [IU] via SUBCUTANEOUS
  Filled 2013-10-27 (×7): qty 0.3

## 2013-10-27 MED ORDER — POTASSIUM CHLORIDE 20 MEQ/15ML (10%) PO LIQD
40.0000 meq | Freq: Once | ORAL | Status: AC
  Administered 2013-10-27: 40 meq via ORAL
  Filled 2013-10-27: qty 30

## 2013-10-27 MED ORDER — FUROSEMIDE 10 MG/ML IJ SOLN
10.0000 mg/h | INTRAVENOUS | Status: DC
  Administered 2013-10-27: 10 mg/h via INTRAVENOUS
  Filled 2013-10-27 (×4): qty 25

## 2013-10-27 MED ORDER — POTASSIUM CHLORIDE 10 MEQ/50ML IV SOLN
INTRAVENOUS | Status: AC
  Administered 2013-10-27: 10 meq via INTRAVENOUS
  Filled 2013-10-27: qty 50

## 2013-10-27 MED ORDER — POTASSIUM CHLORIDE 10 MEQ/50ML IV SOLN
10.0000 meq | INTRAVENOUS | Status: AC
  Administered 2013-10-27 (×5): 10 meq via INTRAVENOUS

## 2013-10-27 NOTE — Progress Notes (Signed)
Patient PQ:ZRAQ JASHAWNA REEVER      DOB: 1945/12/27      TMA:[id number]  Reviewed case with Dr. Titus Mould this am.  Husband had met with Dr. Hilma Favors.  He was not open to transition to comfort care and was going to contact us if he wanted to talk further.  He did not call.  Mrs. Rohleder continues to do poorly .  Mr. Cravens is aware of this.  He is will to meet me Saturday morning between 10-11 am   Raudel Bazen L. Lovena Le, MD MBA The Palliative Medicine Team at [facility] Phone: [phone] Pager: [phone] ( Use team phone after hours)

## 2013-10-27 NOTE — Progress Notes (Signed)
Inpatient Diabetes Program Recommendations  AACE/ADA: New Consensus Statement on Inpatient Glycemic Control (2013)  Target Ranges:  Prepandial:   less than 140 mg/dL      Peak postprandial:   less than 180 mg/dL (1-2 hours)      Critically ill patients:  140 - 180 mg/dL   Results for [name], [name] (MRN [id number]) as of 10/27/2013 12:28  Ref. Range 10/26/2013 03:53 10/26/2013 08:03 10/26/2013 12:18 10/26/2013 16:21 10/26/2013 19:33 10/26/2013 23:44 10/27/2013 04:36 10/27/2013 08:53  Glucose-Capillary Latest Range: 70-99 mg/dL 409 (H) 811 (H) 914 (H) 304 (H) 237 (H) 299 (H) 319 (H) 278 (H)   Diabetes history: DM2  Outpatient Diabetes medications: Lantus 45 units BID, Novolog 0-15 units Q6H  Current orders for Inpatient glycemic control: Lantus 30 units BID, Novolog 0-20 units Q4H  Inpatient Diabetes Program Recommendations Correction (SSI): Since CBGs have consistenlty been greater than 278 mg/dl over the past 12 hours and patient is on the ventilator and ordered tube feedings, please consider discontinuing current Novolog 0-20 units Q4H and use the ICU Glycemic Control order set.    Thanks, Orlando Penner, RN, MSN, CCRN Diabetes Coordinator Inpatient Diabetes Program [phone] (Team Pager) [phone] (AP office) [phone] [facility] office)

## 2013-10-27 NOTE — Progress Notes (Signed)
cCRITICAL VALUE ALERT  Critical value received:  CO2 40   K+ 2.9  Date of notification: 10/27/13  Time of notification: 0538  Critical value read back:Yes.    Nurse who received alert:  Crist Fat RN  MD notified (1st page):  Mungal V.  Time of first page:  0540   Responding MD: Erin Fulling.  Time MD responded:  [phone]

## 2013-10-27 NOTE — Progress Notes (Signed)
PULMONARY / CRITICAL CARE MEDICINE   Name: [name] MRN: [id number] DOB: 01-07-1946    ADMISSION DATE:  10/11/2013  REFERRING MD :  EDP  CHIEF COMPLAINT:  PNA, Respiratory Failure   INITIAL PRESENTATION: 68 y/o F, chronic trach/vent patient, admitted to [facility] 9/11 for L PNA.  Baseline ATC during day, vent QHS.    STUDIES:  9/11  CT Chest > trach in good position, diffuse consolidation on L, patchy infiltrates RLL, Flattening of main stem bronchi noted 9/15  Bronchoscopy- mod tracheal malacia, no major mucous plugging, mild thick pus L main (suctioned) 9/15  EKG-NSR, poor R wave progression  SIGNIFICANT EVENTS: 9/11  Admit with L HCAP, UTI 9/13 worsening resp status, requiring vent 24/7, tx ICU 9/15 mucous plug, desat 9/15 bronch, no major obsturction 9/16 - neg balance 9/18- peep fixed at 18, ARDS remains   SUBJECTIVE:  Nursing reports BM after addition of meds, decreased fever and BP  VITAL SIGNS: Temp:  [98.8 F (37.1 C)-100.1 F (37.8 C)] 98.8 F (37.1 C) (09/18 0440) Pulse Rate:  [93-112] 100 (09/18 0800) Resp:  [23-35] 35 (09/18 0800) BP: (82-109)/(37-67) 85/40 mmHg (09/18 0800) SpO2:  [90 %-100 %] 98 % (09/18 0855) FiO2 (%):  [60 %-100 %] 70 % (09/18 0855) Weight:  [252 lb 6.8 oz (114.5 kg)] 252 lb 6.8 oz (114.5 kg) (09/18 0440)  HEMODYNAMICS:    VENTILATOR SETTINGS: Vent Mode:  [-] PRVC FiO2 (%):  [60 %-100 %] 70 % Set Rate:  [35 bmp] 35 bmp Vt Set:  [400 mL] 400 mL PEEP:  [18 cmH20] 18 cmH20 Plateau Pressure:  [39 cmH20-44 cmH20] 39 cmH20  INTAKE / OUTPUT:  Intake/Output Summary (Last 24 hours) at 10/27/13 0913 Last data filed at 10/27/13 4098  Gross per 24 hour  Intake   2550 ml  Output   2525 ml  Net     25 ml    PHYSICAL EXAMINATION: General:  Chronically ill adult female in NAD Neuro: drowsy HEENT:  Mm pink/moist, poor dentition, #6 XLT trach midline c/d/i Cardiovascular: s1s2, RRR, no r/m/g Lungs:  resp's coarse R >L, even/non-labored  on vent Abdomen:  Round/soft, bsx4 active, PEG site fine Musculoskeletal:  No acute deformities, RUE, BLE swelling Skin:  Hot/dry, thin skin, multiple bruises / tears to upper extremities, R UE redness   LABS:  CBC  Recent Labs Lab 10/23/13 0442 10/24/13 0515 10/25/13 0433  WBC 8.0 7.1 8.9  HGB 8.9* 8.6* 8.7*  HCT 29.8* 28.2* 28.5*  PLT 202 197 215   Coag's  No results found for this basename: APTT, INR,  in the last 168 hours  BMET  Recent Labs Lab 10/25/13 0433 10/26/13 0415 10/27/13 0430  NA 146 142 142  K 3.8 3.0* 2.9*  CL 97 92* 90*  CO2 35* 36* 40*  BUN 60* 57* 56*  CREATININE 0.59 0.59 0.56  GLUCOSE 168* 289* 323*   Electrolytes  Recent Labs Lab 10/25/13 0433 10/26/13 0415 10/27/13 0430  CALCIUM 8.7 8.8 8.9  MG  --  1.8  --   PHOS  --  2.3  --    Sepsis Markers  Recent Labs Lab 10/25/2013 1224  10/22/13 0715 10/22/13 1242 10/23/13 0442  LATICACIDVEN 3.5*  --   --   --   --   PROCALCITON 29.93  < > 16.07 14.23 11.99  < > = values in this interval not displayed.  ABG  Recent Labs Lab 10/23/13 0450 10/24/13 0700 10/24/13 0831  PHART  7.342* 7.271* 7.367  PCO2ART 60.1* 72.2* 65.2*  PO2ART 172.0* 169.0* 187.0*    Liver Enzymes No results found for this basename: AST, ALT, ALKPHOS, BILITOT, ALBUMIN,  in the last 168 hours Cardiac Enzymes  Recent Labs Lab 10/21/13 0525  PROBNP 2401.0*   Glucose  Recent Labs Lab 10/26/13 1218 10/26/13 1621 10/26/13 1933 10/26/13 2344 10/27/13 0436 10/27/13 0853  GLUCAP 266* 304* 237* 299* 319* 278*    Imaging Dg Chest Port 1 View  10/26/2013   CLINICAL DATA:  Shortness of breath.  EXAM: PORTABLE CHEST - 1 VIEW  COMPARISON:  October 25, 2013.  FINDINGS: Stable cardiomediastinal silhouette. Tracheostomy tube is in grossly good position. Right internal jugular catheter line is unchanged in position with distal tip in expected position of the SVC. Stable bilateral lung opacities are noted  consistent with pneumonia, edema or possibly ARDS. No pneumothorax is noted. Stable probable left pleural effusion.  IMPRESSION: No significant change in bilateral lung opacities. Stable support apparatus.   Electronically Signed   By: Roque Lias M.D.   On: 10/26/2013 07:53     ASSESSMENT / PLAN:  PULMONARY Chronic Trach #6 XLT A: Acute on Chronic Respiratory Failure - r/t worsening HCAP, pleural effusion, OHS/OSA HCAP OSA ARDS P:   Cont vent bundle ARDS protocol Goal to peep 16 if able, may need to abort ARDs protocol if fixed shunt noted Keep same TV, want to reduce rate when able - abg in am  Keep plat less 30 if able, chest wall contribution as issue Lasix to neg balance, may need to increase May have a fixed shunt at some point, may need to reduce peep and observe  CARDIOVASCULAR R IJ CVL 9/13 >>  A:  CAD HLD Afib with RVR (possibly due to hypokalemia) Tachycardia hypotension P:   Cont to hold BB Monitor lytes and correct DNR if arrests Diuresis to increase  To 80 mg q8h, may need infusion as MAP soft  RENAL A:   Severe hypervolemia/anasarca Hypokalemia ARDS P:   KCl q h x 5 per [facility] Monitor BMET intermittently Monitor I/Os Correct electrolytes as indicated Lasix to neg balance, unsuccessful and BP low, add infusion bmet in pm Not a candidate for HD KVO  GASTROINTESTINAL A:   GERD Dysphagia due to trach tube LArge massive BM noted yeah!! P:   SUP: enteral famotidine Cont TFs +BM last pm  HEMATOLOGIC Anemia - likely of chronic disease  P:  DVT px: SQ heparin Limit phlebotomy  INFECTIOUS A:   Pseudomonas HCAP Yeast UTI  Strep pneumo bacteremia  Severe sepsis Elevated PCT- improving P:   BCx2 9/11 >>2/2 strep pneumo (RES ceftriaxone, PCN, sens levaquin) UC 9/11 >> multpile organisms Sputum 9/11 >> pseudomonas  Abx: Vanco 9/11 >> 9/14 Diflucan 9/11 >> 9/14 Zosyn, 9/11 >>9/16 Levaquin 9/13>>> (for strep) add stop date 10 days  20th IMipenem 9/16>>9/16 ceftaz 9/16>>>plan 14 days  ENDOCRINE A:   DM 2 Hypothyroidism  P:   Continue Synthroid Cont SSI, resistant scale Cont Lantus 50, consider bid 30  NEUROLOGIC A:   Chronic Pain / Fibromyalgia Osteoarthritis  P:   RASS goal: -2 as vent setting improve Continue home klonopin, zoloft Hold home fentanyl patch fent drip to remain, when peep to 10 , consider to int Will discuss with husband option for comfort, have involved pall care who has worked with them before [employer], PA-S  [employer] 10/26/13  I have fully examined this patient and agree with  above findings.    And edited i nfull  Ccm time 30 min   Mcarthur Rossetti. Tyson Alias, MD, FACP Pgr: [phone] [place] [facility]

## 2013-10-27 NOTE — Progress Notes (Signed)
Patient sats dropped to 87% from 92% halfway through CPT. CPT stopped and O2 breaths given. PEEP turned back up to 14. Sats back up to 91%.

## 2013-10-27 NOTE — Progress Notes (Signed)
Patient ID: TALYN EDDIE, female   DOB: 1945/05/19, 68 y.o.   MRN: [id number] Updated Husband Peep to 12 but fio2 to 70% Likely will never come off vent in future and further quality of life from ARDS HE will consider dc vent Sunday if not improved.  Will notify Pall care  Mcarthur Rossetti. Tyson Alias, MD, FACP Pgr: [phone] [place] [facility]

## 2013-10-27 NOTE — Progress Notes (Signed)
Utilization Review Completed.  

## 2013-10-27 NOTE — Progress Notes (Signed)
eLink Physician-Brief Progress Note Patient Name: [name] DOB: 11/19/1945 MRN: [id number]   Date of Service  10/27/2013  HPI/Events of Note  hypokalemia  eICU Interventions  repleted     Intervention Category Minor Interventions: Electrolytes abnormality - evaluation and management  Haizel Gatchell 10/27/2013, 5:41 AM

## 2013-10-28 LAB — BLOOD GAS, ARTERIAL
Acid-Base Excess: 15.9 mmol/L — ABNORMAL HIGH (ref 0.0–2.0)
Bicarbonate: 42.4 mEq/L — ABNORMAL HIGH (ref 20.0–24.0)
Drawn by: 419771
FIO2: 0.6 %
O2 Saturation: 93.2 %
PATIENT TEMPERATURE: 98.6
PEEP: 20 cmH2O
PH ART: 7.342 — AB (ref 7.350–7.450)
RATE: 28 resp/min
TCO2: 44.8 mmol/L (ref 0–100)
VT: 400 mL
pCO2 arterial: 80.2 mmHg (ref 35.0–45.0)
pO2, Arterial: 75.1 mmHg — ABNORMAL LOW (ref 80.0–100.0)

## 2013-10-28 LAB — BASIC METABOLIC PANEL
ANION GAP: 11 (ref 5–15)
ANION GAP: 61 — AB (ref 5–15)
BUN: 48 mg/dL — AB (ref 6–23)
BUN: 46 mg/dL — ABNORMAL HIGH (ref 6–23)
CALCIUM: 9.4 mg/dL (ref 8.4–10.5)
CHLORIDE: 41 meq/L — AB (ref 96–112)
CO2: 41 mEq/L (ref 19–32)
CO2: 44 meq/L — AB (ref 19–32)
CREATININE: 0.56 mg/dL (ref 0.50–1.10)
Calcium: 9.1 mg/dL (ref 8.4–10.5)
Chloride: 87 mEq/L — ABNORMAL LOW (ref 96–112)
Creatinine, Ser: 0.55 mg/dL (ref 0.50–1.10)
GFR calc Af Amer: >90 mL/min (ref >90)
GFR calc non Af Amer: >90 mL/min (ref >90)
Glucose, Bld: 337 mg/dL — ABNORMAL HIGH (ref 70–99)
Glucose, Bld: 390 mg/dL — ABNORMAL HIGH (ref 70–99)
Potassium: 2.9 mEq/L — CL (ref 3.7–5.3)
Potassium: 3.7 mEq/L (ref 3.7–5.3)
SODIUM: 142 meq/L (ref 137–147)
Sodium: 143 mEq/L (ref 137–147)

## 2013-10-28 LAB — GLUCOSE, CAPILLARY
GLUCOSE-CAPILLARY: 335 mg/dL — AB (ref 70–99)
Glucose-Capillary: 365 mg/dL — ABNORMAL HIGH (ref 70–99)
Glucose-Capillary: 371 mg/dL — ABNORMAL HIGH (ref 70–99)
Glucose-Capillary: 374 mg/dL — ABNORMAL HIGH (ref 70–99)
Glucose-Capillary: 388 mg/dL — ABNORMAL HIGH (ref 70–99)
Glucose-Capillary: 392 mg/dL — ABNORMAL HIGH (ref 70–99)
Glucose-Capillary: 424 mg/dL — ABNORMAL HIGH (ref 70–99)

## 2013-10-28 LAB — PHOSPHORUS: Phosphorus: 2.9 mg/dL (ref 2.3–4.6)

## 2013-10-28 LAB — MAGNESIUM: Magnesium: 1.5 mg/dL (ref 1.5–2.5)

## 2013-10-28 MED ORDER — SODIUM CHLORIDE 0.9 % IV SOLN
INTRAVENOUS | Status: DC
  Administered 2013-10-29: 3.6 [IU]/h via INTRAVENOUS
  Filled 2013-10-28: qty 2.5

## 2013-10-28 MED ORDER — POTASSIUM CHLORIDE 10 MEQ/100ML IV SOLN
10.0000 meq | INTRAVENOUS | Status: AC
  Administered 2013-10-28 (×3): 10 meq via INTRAVENOUS
  Filled 2013-10-28: qty 100

## 2013-10-28 MED ORDER — POTASSIUM CHLORIDE 20 MEQ/15ML (10%) PO LIQD
40.0000 meq | Freq: Four times a day (QID) | ORAL | Status: AC
  Administered 2013-10-28 (×2): 40 meq
  Filled 2013-10-28 (×3): qty 30

## 2013-10-28 MED ORDER — PHENYLEPHRINE HCL 10 MG/ML IJ SOLN
30.0000 ug/min | INTRAMUSCULAR | Status: DC
  Filled 2013-10-28: qty 1

## 2013-10-28 NOTE — Progress Notes (Signed)
eLink Physician-Brief Progress Note Patient Name: [name] DOB: 18-Apr-1945 MRN: [id number]   Date of Service  10/28/2013  HPI/Events of Note   Tachycardic. Unclear etiology. ? Pain.   eICU Interventions   Increase fentanyl to 100 and bolus 100.      Intervention Category Intermediate Interventions: Pain - evaluation and management  Kathelyn Gombos R. 10/28/2013, 8:01 PM

## 2013-10-28 NOTE — Progress Notes (Signed)
eLink Physician-Brief Progress Note Patient Name: [name] DOB: [birth year] MRN: [id number]   Date of Service  10/28/2013  HPI/Events of Note   Pt. Identified for early review through new eICU protocol. Hypoxic but with several ABGs with FiO2 >= 70%. Also hypotensive but per rounding MDs markedly fluid up. Will therefore support BP to allow continued diuresis.   eICU Interventions   Decrease FiO2 to 60%. Repeat ABG in 4 hrs. Start phenylephrine to support BP.      Intervention Category Major Interventions: Hypoxemia - evaluation and management  Rachelle Edwards R. 10/28/2013, 3:37 PM

## 2013-10-28 NOTE — Progress Notes (Signed)
PULMONARY / CRITICAL CARE MEDICINE   Name: [name] MRN: [id number] DOB: 1945-12-08    ADMISSION DATE:  11/05/2013  REFERRING MD :  EDP  CHIEF COMPLAINT:  PNA, Respiratory Failure   INITIAL PRESENTATION: 68 y/o F, chronic trach/vent patient, admitted to [facility] 9/11 for L PNA.  Baseline ATC during day, vent QHS.    STUDIES:  9/11  CT Chest > trach in good position, diffuse consolidation on L, patchy infiltrates RLL, Flattening of main stem bronchi noted 9/15  Bronchoscopy- mod tracheal malacia, no major mucous plugging, mild thick pus L main (suctioned) 9/15  EKG-NSR, poor R wave progression  SIGNIFICANT EVENTS: 9/11  Admit with L HCAP, UTI 9/13 worsening resp status, requiring vent 24/7, tx ICU 9/15 mucous plug, desat 9/15 bronch, no major obsturction 9/16 - neg balance 9/18- peep fixed at 18, ARDS remains   SUBJECTIVE:  Febrile Remains on high PEEP 7 FIO2 Good UO on lasix gtt  VITAL SIGNS: Temp:  [99 F (37.2 C)-100.8 F (38.2 C)] 99.1 F (37.3 C) (09/19 0802) Pulse Rate:  [116-138] 135 (09/19 1208) Resp:  [24-36] 35 (09/19 1208) BP: (83-122)/(29-51) 117/46 mmHg (09/19 1208) SpO2:  [82 %-98 %] 95 % (09/19 1208) FiO2 (%):  [70 %-100 %] 70 % (09/19 1209) Weight:  [112.1 kg (247 lb 2.2 oz)] 112.1 kg (247 lb 2.2 oz) (09/19 0500)  HEMODYNAMICS:    VENTILATOR SETTINGS: Vent Mode:  [-] PRVC FiO2 (%):  [70 %-100 %] 70 % Set Rate:  [35 bmp] 35 bmp Vt Set:  [400 mL] 400 mL PEEP:  [12 cmH20-14 cmH20] 14 cmH20 Plateau Pressure:  [30 cmH20-42 cmH20] 38 cmH20  INTAKE / OUTPUT:  Intake/Output Summary (Last 24 hours) at 10/28/13 1301 Last data filed at 10/28/13 0700  Gross per 24 hour  Intake 1687.5 ml  Output   3140 ml  Net -1452.5 ml    PHYSICAL EXAMINATION: General:  Chronically ill adult female in NAD Neuro: drowsy, RASS 0 HEENT:  Mm pink/moist, poor dentition, #6 XLT trach midline c/d/i Cardiovascular: s1s2, RRR, no r/m/g Lungs:  resp's coarse R >L,  even/non-labored on vent Abdomen:  Round/soft, bsx4 active, PEG site fine Musculoskeletal:  No acute deformities, RUE, BLE swelling Skin:  Hot/dry, thin skin, multiple bruises / tears to upper extremities, R UE redness   LABS:  CBC  Recent Labs Lab 10/24/13 0515 10/25/13 0433 10/27/13 1137  WBC 7.1 8.9 15.8*  HGB 8.6* 8.7* 8.4*  HCT 28.2* 28.5* 27.8*  PLT 197 215 242   Coag's  No results found for this basename: APTT, INR,  in the last 168 hours  BMET  Recent Labs Lab 10/27/13 0430 10/27/13 1137 10/27/13 2350  NA 142 142 143  K 2.9* 3.5* 3.7  CL 90* 92* 41*  CO2 40* 39* 41*  BUN 56* 54* 48*  CREATININE 0.56 0.58 0.56  GLUCOSE 323* 283* 337*   Electrolytes  Recent Labs Lab 10/26/13 0415 10/27/13 0430 10/27/13 1137 10/27/13 2350 10/28/13 0440  CALCIUM 8.8 8.9 8.8 9.1  --   MG 1.8  --   --   --  1.5  PHOS 2.3  --   --   --  2.9   Sepsis Markers  Recent Labs Lab 10/22/13 0715 10/22/13 1242 10/23/13 0442  PROCALCITON 16.07 14.23 11.99    ABG  Recent Labs Lab 10/23/13 0450 10/24/13 0700 10/24/13 0831  PHART 7.342* 7.271* 7.367  PCO2ART 60.1* 72.2* 65.2*  PO2ART 172.0* 169.0* 187.0*  Liver Enzymes No results found for this basename: AST, ALT, ALKPHOS, BILITOT, ALBUMIN,  in the last 168 hours Cardiac Enzymes No results found for this basename: TROPONINI, PROBNP,  in the last 168 hours Glucose  Recent Labs Lab 10/27/13 1246 10/27/13 1649 10/27/13 1935 10/28/13 0006 10/28/13 0425 10/28/13 0753  GLUCAP 279* 297* 305* 335* 388* 365*    Imaging Dg Chest Port 1 View  10/27/2013   CLINICAL DATA:  Airspace disease.  Tracheostomy tube placement.  EXAM: PORTABLE CHEST - 1 VIEW  COMPARISON:  October 26, 2013.  FINDINGS: Tracheostomy tube is in grossly good position. Right internal jugular catheter is unchanged with distal tip in expected position of the SVC. Stable diffuse bilateral lung opacities are noted concerning for pneumonia, edema or  ARDS. Mild bilateral pleural effusions are noted. No pneumothorax is noted.  IMPRESSION: Stable support apparatus. Stable diffuse bilateral lung opacities are noted concerning for pneumonia, edema or ARDS. Mild bilateral pleural effusions are noted.   Electronically Signed   By: Roque Lias M.D.   On: 10/27/2013 12:16     ASSESSMENT / PLAN:  PULMONARY Chronic Trach #6 XLT A: Acute on Chronic Respiratory Failure - r/t worsening HCAP, pleural effusion, OHS/OSA HCAP OSA ARDS P:   ARDS protocol Keep peep 16 , drop FIO2 as able Lasix to neg balance, may need to increase May have a fixed shunt at some point, may need to reduce peep and observe  CARDIOVASCULAR R IJ CVL 9/13 >>  A:  CAD HLD Afib with RVR (possibly due to hypokalemia) Tachycardia hypotension P:   Cont to hold BB DNR if arrests   RENAL A:   Severe hypervolemia/anasarca Hypokalemia P:   Monitor BMET q 12 Monitor I/Os Correct electrolytes as indicated Lasix gtt to neg balance  Not a candidate for HD KVO  GASTROINTESTINAL A:   GERD Dysphagia due to trach tube LArge massive BM noted yeah!! P:   SUP: enteral famotidine Cont TFs +BM last pm  HEMATOLOGIC Anemia - likely of chronic disease  P:  DVT px: SQ heparin Limit phlebotomy  INFECTIOUS A:   Pseudomonas HCAP Yeast UTI  Strep pneumo bacteremia  Severe sepsis Elevated PCT- improving P:   BCx2 9/11 >>2/2 strep pneumo (RES ceftriaxone, PCN, sens levaquin) UC 9/11 >> multpile organisms Sputum 9/11 >> pseudomonas bld 9/18 >>  Abx: Vanco 9/11 >> 9/14 Diflucan 9/11 >> 9/14 Zosyn, 9/11 >>9/16 Levaquin 9/13>>> (for strep) add stop date 10 days 20th IMipenem 9/16>>9/16 ceftaz 9/16>>>plan 14 days  ENDOCRINE A:   DM 2 Hypothyroidism  P:   Continue Synthroid Cont SSI, resistant scale Cont Lantus  bid 30 - will not use gtt  NEUROLOGIC A:   Chronic Pain / Fibromyalgia Osteoarthritis  P:   RASS goal: -2 as vent setting  improve Continue home klonopin, zoloft Hold home fentanyl patch fent drip to remain, when peep to 10 , consider to int WUA  Summary - ARDS , good neg balance with lasix gtt but hypoxia not much improved,may be fixed shunt Husband contemplating withdrawal -may be harder since she is somewhat awake. More discussion on 9/20 planned  Care during the described time interval was provided by me and/or other providers on the critical care team.  I have reviewed this patient's available data, including medical history, events of note, physical examination and test results as part of my evaluation  CC time x 33m  [name] MD. FCCP. [place] [facility] Pager [phone] If no response  call [phone]

## 2013-10-28 NOTE — Progress Notes (Signed)
Patient [name]      DOB: [birth year]      EAV:[id number]  Asked to revisit with [name] by Dr. Tyson Alias as CCM has done all they can do to reverse this process. The patient remains in poor condition not tolerating standard of treatment.  [name] and [name] will be married 37 yrs on Oct 16th.  Early in this process she had told him to continue to treat and do CPR. Last Sunday she looked at him and told him she wished that the Lord would call her home.  [name] made her a DNR at that time as he does not want anyone "bashing on her chest".  He understands that we are likely at that moment when consideration liberation from the Vent makes most sense in promoting comfort and dignity.  He needs to talk with the patient's sister [name].  I have spoke with Dr. Vassie Loll.  [name] plans to talk to [name] and arrange a time tomorrow with CCM to answer final questions and possibly withdraw care if his sister in law agrees.  I have clarified with CCM that the Spouse desires to talk with them so the Palliative Medicine Team will make ourselves available for support but let them control the withdraw time etc.  Dr. Sharl Ma will be covering the service tomorrow into Monday.  I will ask him to collaborate as needed.   Shelly Armbrister L. Ladona Ridgel, MD MBA The Palliative Medicine Team at [facility] Phone: [phone] Pager: [phone] ( Use team phone after hours)

## 2013-10-28 NOTE — Progress Notes (Signed)
eLink Physician-Brief Progress Note Patient Name: [name] DOB: 14-Jul-1945 MRN: [id number]   Date of Service  10/28/2013  HPI/Events of Note  hyperglycemia  eICU Interventions  Start insulin drip     Intervention Category Major Interventions: Hyperglycemia - active titration of insulin therapy  Shan Levans 10/28/2013, 11:45 PM

## 2013-10-28 NOTE — Progress Notes (Signed)
CRITICAL VALUE ALERT  Critical value received:  CO2 41  Date of notification:  10/28/2013  Time of notification:  00:55  Critical value read back:Yes.    Nurse who received alert:  Allene Dillon, RN  MD notified (1st page):  Florestine Avers.  Time of first page:  01:03  MD notified (2nd page):  Time of second page:  Responding MD:  Florestine Avers.  Time MD responded:  01:05

## 2013-10-29 ENCOUNTER — Inpatient Hospital Stay (HOSPITAL_COMMUNITY): Payer: Medicare Other

## 2013-10-29 DIAGNOSIS — Z515 Encounter for palliative care: Secondary | ICD-10-CM

## 2013-10-29 LAB — BASIC METABOLIC PANEL
ANION GAP: 9 (ref 5–15)
BUN: 44 mg/dL — ABNORMAL HIGH (ref 6–23)
CALCIUM: 9.3 mg/dL (ref 8.4–10.5)
CO2: 43 meq/L — AB (ref 19–32)
Chloride: 91 mEq/L — ABNORMAL LOW (ref 96–112)
Creatinine, Ser: 0.57 mg/dL (ref 0.50–1.10)
GFR calc Af Amer: >90 mL/min (ref >90)
GFR calc non Af Amer: >90 mL/min (ref >90)
GLUCOSE: 398 mg/dL — AB (ref 70–99)
POTASSIUM: 5 meq/L (ref 3.7–5.3)
Sodium: 143 mEq/L (ref 137–147)

## 2013-10-29 LAB — CBC
HCT: 31.1 % — ABNORMAL LOW (ref 36.0–46.0)
Hemoglobin: 8.9 g/dL — ABNORMAL LOW (ref 12.0–15.0)
MCH: 29.7 pg (ref 26.0–34.0)
MCHC: 28.6 g/dL — ABNORMAL LOW (ref 30.0–36.0)
MCV: 103.7 fL — AB (ref 78.0–100.0)
Platelets: 360 10*3/uL (ref 150–400)
RBC: 3 MIL/uL — ABNORMAL LOW (ref 3.87–5.11)
RDW: 16.9 % — AB (ref 11.5–15.5)
WBC: 16.4 10*3/uL — ABNORMAL HIGH (ref 4.0–10.5)

## 2013-10-29 LAB — GLUCOSE, CAPILLARY
GLUCOSE-CAPILLARY: 439 mg/dL — AB (ref 70–99)
GLUCOSE-CAPILLARY: 414 mg/dL — AB (ref 70–99)
GLUCOSE-CAPILLARY: 415 mg/dL — AB (ref 70–99)
GLUCOSE-CAPILLARY: 370 mg/dL — AB (ref 70–99)
GLUCOSE-CAPILLARY: 359 mg/dL — AB (ref 70–99)
Glucose-Capillary: 440 mg/dL — ABNORMAL HIGH (ref 70–99)
Glucose-Capillary: 409 mg/dL — ABNORMAL HIGH (ref 70–99)

## 2013-10-30 LAB — GLUCOSE, CAPILLARY
GLUCOSE-CAPILLARY: 373 mg/dL — AB (ref 70–99)
GLUCOSE-CAPILLARY: 374 mg/dL — AB (ref 70–99)
GLUCOSE-CAPILLARY: 363 mg/dL — AB (ref 70–99)
Glucose-Capillary: 381 mg/dL — ABNORMAL HIGH (ref 70–99)
Glucose-Capillary: 371 mg/dL — ABNORMAL HIGH (ref 70–99)

## 2013-10-31 NOTE — Discharge Summary (Signed)
PULMONARY / CRITICAL CARE MEDICINE   Name: [name] MRN: [id number] DOB: February 13, 1945    ADMISSION DATE:  10/31/2013  REFERRING MD :  EDP  CHIEF COMPLAINT:  PNA, Respiratory Failure   INITIAL PRESENTATION: 68 y/o F, chronic trach/vent patient, admitted to [facility] 9/11 for L PNA.  Baseline ATC during day, vent QHS.    STUDIES:  9/11  CT Chest > trach in good position, diffuse consolidation on L, patchy infiltrates RLL, Flattening of main stem bronchi noted 9/15  Bronchoscopy- mod tracheal malacia, no major mucous plugging, mild thick pus L main (suctioned) 9/15  EKG-NSR, poor R wave progression  SIGNIFICANT EVENTS: 9/11  Admit with L HCAP, UTI 9/13 worsening resp status, requiring vent 24/7, tx ICU 9/15 mucous plug, desat 9/15 bronch, no major obsturction 9/16 - neg balance 9/18- peep fixed at 18, ARDS remains    ASSESSMENT / PLAN:  PULMONARY Chronic Trach #6 XLT A: Acute on Chronic Respiratory Failure - r/t worsening HCAP, pleural effusion, OHS/OSA HCAP OSA ARDS P:   ARDS protocol Keep peep 18 , drop FIO2 as able   CARDIOVASCULAR R IJ CVL 9/13 >>  A:  CAD HLD Afib with RVR (possibly due to hypokalemia) Tachycardia hypotension P:   Cont to hold BB DNR if arrests If BP drops, have to back off lasix gtt   RENAL A:   Severe hypervolemia/anasarca Hypokalemia P:   Lasix gtt to neg balance    INFECTIOUS A:   Pseudomonas HCAP Yeast UTI  Strep pneumo bacteremia  Severe sepsis Elevated PCT- improving P:   BCx2 9/11 >>2/2 strep pneumo (RES ceftriaxone, PCN, sens levaquin) UC 9/11 >> multpile organisms Sputum 9/11 >> pseudomonas bld 9/18 >>ng  Abx: Vanco 9/11 >> 9/14 Diflucan 9/11 >> 9/14 Zosyn, 9/11 >>9/16 Levaquin 9/13>>> (for strep) add stop date 10 days 20th IMipenem 9/16>>9/16 ceftaz 9/16>>>plan 14 days  ENDOCRINE A:   DM 2 Hypothyroidism  P:   Continue Synthroid Placed on insulin gtt 9/20  NEUROLOGIC A:   Chronic Pain /  Fibromyalgia Osteoarthritis  P:   RASS goal: -2  Continue home klonopin, zoloft Hold home fentanyl patch fent drip to remain, when peep to 10 , consider to int WUA  COURSE - she developed ARDS with severe hypoxia that did not improve with mechanical ventilation and PEEP ,may be fixed shunt After speaking to family members in great detail over a period of time, decision was made to withdraw life support. She passed away shortly after.  Cause of death- acute on chronic respiratory failure, healthcare associated pneumonia  Shelly Mourning MD. FCCP. [place] [facility] Pager [phone] If no response call [phone]

## 2013-11-01 ENCOUNTER — Inpatient Hospital Stay (HOSPITAL_COMMUNITY)
Admission: RE | Admit: 2013-11-01 | Discharge: 2013-11-01 | Disposition: A | Discharge status: Home / Self Care | Payer: Medicare Other | Source: Ambulatory Visit | Attending: Internal Medicine | Admitting: Internal Medicine

## 2013-11-01 ENCOUNTER — Ambulatory Visit (HOSPITAL_COMMUNITY): Payer: Medicare Other

## 2013-11-02 LAB — CULTURE, BLOOD (ROUTINE X 2)
CULTURE: NO GROWTH
Culture: NO GROWTH

## 2013-11-09 NOTE — Progress Notes (Signed)
Pt taken off vent at this time and placed on ATC.

## 2013-11-09 NOTE — Progress Notes (Signed)
Detailed discussion with sister, husband & daughter. Comfort care is the goal Withdrawal of life support orders written . Use fent gtt, taper PEEP, then transition to ATC Dc insulin & lasix gtt  Oretha Milch MD

## 2013-11-09 NOTE — Progress Notes (Signed)
Patient expired at 1508 , confirmed with RN Woody Seller, cyril. No breath sounds , no heart  tones. Patient DNR. Family at the bedside during extubation/withdraw. Emotional support provided. Family requested no autopsy. Elink physician made aware. No belongings in patients room. Death reported to [place]  [email or web address].

## 2013-11-09 NOTE — Progress Notes (Signed)
[facility] MEDICAL ICU  PULMONARY / CRITICAL CARE MEDICINE     Name: Shelly KohlerRuth M Kelley MRN:  [id number] DOB:   03/22/1945             ADMISSION DATE:  03-28-2013   REFERRING MD :  EDP   CHIEF COMPLAINT:  PNA, Respiratory Failure    INITIAL PRESENTATION: 68 y/o F, chronic trach/vent patient, [facility] L PNA.  Baseline ATC during day, vent QHS.      STUDIES:   9/11  CT Chest > trach in good position, diffuse consolidation on L, patchy infiltrates RLL, Flattening of main stem bronchi noted 9/15  Bronchoscopy- mod tracheal malacia, no major mucous plugging, mild thick pus L main (suctioned) 9/15  EKG-NSR, poor R wave progression   SIGNIFICANT EVENTS: 9/11  Admit with L HCAP, UTI 9/13 worsening resp status, requiring vent 24/7, tx ICU 9/15 mucous plug, desat 9/15 bronch, no major obsturction 9/16 - neg balance 9/18- peep fixed at 18, ARDS remains    SUBJECTIVE:   Nursing reports BM after addition of meds, decreased fever and BP   VITAL SIGNS: Temp:  [98.8 F (37.1 C)-100.1 F (37.8 C)] 98.8 F (37.1 C) (09/18 0440) Pulse Rate:  [93-112] 100 (09/18 0800) Resp:  [23-35] 35 (09/18 0800) BP: (82-109)/(37-67) 85/40 mmHg (09/18 0800) SpO2:  [90 %-100 %] 98 % (09/18 0855) FiO2 (%):  [60 %-100 %] 70 % (09/18 0855) Weight:  [252 lb 6.8 oz (114.5 kg)] 252 lb 6.8 oz (114.5 kg) (09/18 0440)   HEMODYNAMICS:   VENTILATOR SETTINGS: Vent Mode:  [-] PRVC FiO2 (%):  [60 %-100 %] 70 % Set Rate:  [35 bmp] 35 bmp Vt Set:  [400 mL] 400 mL PEEP:  [18 cmH20] 18 cmH20 Plateau Pressure:  [39 cmH20-44 cmH20] 39 cmH20   INTAKE / OUTPUT: Intake/Output Summary (Last 24 hours) at 10/27/13 0913 Last data filed at 10/27/13 [id number]   Gross per 24 hour   Intake    2550 ml   Output    2525 ml   Net      25 ml      PHYSICAL EXAMINATION: General:  Chronically ill adult female in NAD Neuro: drowsy HEENT:  Mm pink/moist, poor dentition, #6 XLT trach midline  c/d/i Cardiovascular: s1s2, RRR, no r/m/g Lungs:  resp's coarse R >L, even/non-labored on vent Abdomen:  Round/soft, bsx4 active, PEG site fine Musculoskeletal:  No acute deformities, RUE, BLE swelling Skin:  Hot/dry, thin skin, multiple bruises / tears to upper extremities, R UE redness    LABS:   CBC Recent Labs Lab  10/23/13 0442  10/24/13 0515  10/25/13 0433   WBC  8.0  7.1  8.9   HGB  8.9*  8.6*  8.7*   HCT  29.8*  28.2*  28.5*   PLT  202  197  215    Coag's   No results found for this basename: APTT, INR,  in the last 168 hours   BMET Recent Labs Lab  10/25/13 0433  10/26/13 0415  10/27/13 0430   NA  146  142  142   K  3.8  3.0*  2.9*   CL  97  92*  90*   CO2  35*  36*  40*   BUN  60*  57*  56*   CREATININE  0.59  0.59  0.56   GLUCOSE  168*  289*  323*    Electrolytes Recent Labs Lab  10/25/13 0433  10/26/13 0415  10/27/13 0430   CALCIUM  8.7  8.8  8.9   MG   --   1.8   --    PHOS   --   2.3   --     Sepsis Markers Recent Labs Lab  10/23/2013 1224    10/22/13 0715  10/22/13 1242  10/23/13 0442   LATICACIDVEN  3.5*   --    --    --    --    PROCALCITON  29.93   < >  16.07  14.23  11.99    < > = values in this interval not displayed.   ABG Recent Labs Lab  10/23/13 0450  10/24/13 0700  10/24/13 0831   PHART  7.342*  7.271*  7.367   PCO2ART  60.1*  72.2*  65.2*   PO2ART  172.0*  169.0*  187.0*      Liver Enzymes No results found for this basename: AST, ALT, ALKPHOS, BILITOT, ALBUMIN,  in the last 168 hours Cardiac Enzymes Recent Labs Lab  10/21/13 0525   PROBNP  2401.0*    Glucose Recent Labs Lab  10/26/13 1218  10/26/13 1621  10/26/13 1933  10/26/13 2344  10/27/13 0436  10/27/13 0853   GLUCAP  266*  304*  237*  299*  319*  278*      Imaging Dg Chest Port 1 View   10/26/2013   CLINICAL DATA:  Shortness of breath.  EXAM: PORTABLE CHEST - 1 VIEW  COMPARISON:  October 25, 2013.  FINDINGS: Stable cardiomediastinal  silhouette. Tracheostomy tube is in grossly good position. Right internal jugular catheter line is unchanged in position with distal tip in expected position of the SVC. Stable bilateral lung opacities are noted consistent with pneumonia, edema or possibly ARDS. No pneumothorax is noted. Stable probable left pleural effusion.  IMPRESSION: No significant change in bilateral lung opacities. Stable support apparatus.   Electronically Signed   By: Roque Lias M.D.   On: 10/26/2013 07:53       ASSESSMENT / PLAN:   PULMONARY Chronic Trach #6 XLT A: Acute on Chronic Respiratory Failure - r/t worsening HCAP, pleural effusion, OHS/OSA HCAP OSA ARDS P:    Cont vent bundle ARDS protocol Goal to peep 16 if able, may need to abort ARDs protocol if fixed shunt noted Keep same TV, want to reduce rate when able - abg in am   Keep plat less 30 if able, chest wall contribution as issue Lasix to neg balance, may need to increase May have a fixed shunt at some point, may need to reduce peep and observe   CARDIOVASCULAR R IJ CVL 9/13 >>  A:   CAD HLD Afib with RVR (possibly due to hypokalemia) Tachycardia hypotension P:    Cont to hold BB Monitor lytes and correct DNR if arrests Diuresis to increase  To 80 mg q8h, may need infusion as MAP soft   RENAL A:    Severe hypervolemia/anasarca Hypokalemia ARDS P:    KCl q h x 5 per [facility] Monitor BMET intermittently Monitor I/Os Correct electrolytes as indicated Lasix to neg balance, unsuccessful and BP low, add infusion bmet in pm Not a candidate for HD KVO   GASTROINTESTINAL A:    GERD Dysphagia due to trach tube LArge massive BM noted yeah!! P:    SUP: enteral famotidine Cont TFs +BM last pm   HEMATOLOGIC Anemia - likely of chronic disease   P:  DVT px: SQ heparin Limit phlebotomy   INFECTIOUS A:    Pseudomonas HCAP Yeast UTI   Strep pneumo bacteremia   Severe sepsis Elevated PCT- improving P:    BCx2  9/11 >>2/2 strep pneumo (RES ceftriaxone, PCN, sens levaquin) UC 9/11 >> multpile organisms Sputum 9/11 >> pseudomonas   Abx: Vanco 9/11 >> 9/14 Diflucan 9/11 >> 9/14 Zosyn, 9/11 >>9/16 Levaquin 9/13>>> (for strep) add stop date 10 days 20th IMipenem 9/16>>9/16 ceftaz 9/16>>>plan 14 days   ENDOCRINE A:    DM 2 Hypothyroidism   P:    Continue Synthroid Cont SSI, resistant scale Cont Lantus 50, consider bid 30   NEUROLOGIC A:    Chronic Pain / Fibromyalgia Osteoarthritis   P:    RASS goal: -2 as vent setting improve Continue home klonopin, zoloft Hold home fentanyl patch fent drip to remain, when peep to 10 , consider to int Will discuss with husband option for comfort, have involved pall care who has worked with them before WUA   I have fully examined this patient and agree with above findings.     And edited i nfull   Ccm time 30 min    Mcarthur Rossetti. Tyson Alias, MD, FACP Pgr: [phone] [place] [facility]

## 2013-11-09 NOTE — Progress Notes (Signed)
PULMONARY / CRITICAL CARE MEDICINE   Name: [name] MRN: [id number] DOB: 1945-12-07    ADMISSION DATE:  10/31/2013  REFERRING MD :  EDP  CHIEF COMPLAINT:  PNA, Respiratory Failure   INITIAL PRESENTATION: 68 y/o F, chronic trach/vent patient, admitted to [facility] 9/11 for L PNA.  Baseline ATC during day, vent QHS.    STUDIES:  9/11  CT Chest > trach in good position, diffuse consolidation on L, patchy infiltrates RLL, Flattening of main stem bronchi noted 9/15  Bronchoscopy- mod tracheal malacia, no major mucous plugging, mild thick pus L main (suctioned) 9/15  EKG-NSR, poor R wave progression  SIGNIFICANT EVENTS: 9/11  Admit with L HCAP, UTI 9/13 worsening resp status, requiring vent 24/7, tx ICU 9/15 mucous plug, desat 9/15 bronch, no major obsturction 9/16 - neg balance 9/18- peep fixed at 18, ARDS remains   SUBJECTIVE:  Hypotensive overnight  Remains on high PEEP 20 & 60% FIO2 Good UO on lasix gtt  VITAL SIGNS: Temp:  [99.1 F (37.3 C)-99.9 F (37.7 C)] 99.2 F (37.3 C) (09/20 0738) Pulse Rate:  [125-145] 141 (09/20 0800) Resp:  [24-35] 28 (09/20 0800) BP: (95-133)/(35-73) 103/56 mmHg (09/20 0800) SpO2:  [85 %-96 %] 96 % (09/20 0800) FiO2 (%):  [60 %-80 %] 60 % (09/20 0820) Weight:  [111.2 kg (245 lb 2.4 oz)] 111.2 kg (245 lb 2.4 oz) (09/20 0400)  HEMODYNAMICS:    VENTILATOR SETTINGS: Vent Mode:  [-] PRVC FiO2 (%):  [60 %-80 %] 60 % Set Rate:  [28 bmp-35 bmp] 28 bmp Vt Set:  [400 mL] 400 mL PEEP:  [12 cmH20-20 cmH20] 18 cmH20 Plateau Pressure:  [0 cmH20-47 cmH20] 47 cmH20  INTAKE / OUTPUT:  Intake/Output Summary (Last 24 hours) at 11/06/2013 0951 Last data filed at 10/25/2013 0900  Gross per 24 hour  Intake 2405.29 ml  Output   2880 ml  Net -474.71 ml    PHYSICAL EXAMINATION: General:  Chronically ill adult female in NAD Neuro: drowsy, RASS 0, does not follow commands this am HEENT:  Mm pink/moist, poor dentition, #6 XLT trach midline  c/d/i Cardiovascular: s1s2, RRR, no r/m/g Lungs:  resp's coarse R >L, even/non-labored on vent Abdomen:  Round/soft, bsx4 active, PEG site fine Musculoskeletal:  No acute deformities, RUE, BLE swelling Skin:  Hot/dry, thin skin, multiple bruises / tears to upper extremities, R UE redness   LABS:  CBC  Recent Labs Lab 10/25/13 0433 10/27/13 1137 11/08/2013 0445  WBC 8.9 15.8* 16.4*  HGB 8.7* 8.4* 8.9*  HCT 28.5* 27.8* 31.1*  PLT 215 242 360   Coag's  No results found for this basename: APTT, INR,  in the last 168 hours  BMET  Recent Labs Lab 10/27/13 2350 10/28/13 1344 10/28/13 2330  NA 143 142 143  K 3.7 2.9* 5.0  CL 41* 87* 91*  CO2 41* 44* 43*  BUN 48* 46* 44*  CREATININE 0.56 0.55 0.57  GLUCOSE 337* 390* 398*   Electrolytes  Recent Labs Lab 10/26/13 0415  10/27/13 2350 10/28/13 0440 10/28/13 1344 10/28/13 2330  CALCIUM 8.8  < > 9.1  --  9.4 9.3  MG 1.8  --   --  1.5  --   --   PHOS 2.3  --   --  2.9  --   --   < > = values in this interval not displayed. Sepsis Markers  Recent Labs Lab 10/22/13 1242 10/23/13 0442  PROCALCITON 14.23 11.99    ABG  Recent  Labs Lab 10/24/13 0700 10/24/13 0831 10/28/13 2302  PHART 7.271* 7.367 7.342*  PCO2ART 72.2* 65.2* 80.2*  PO2ART 169.0* 187.0* 75.1*    Liver Enzymes No results found for this basename: AST, ALT, ALKPHOS, BILITOT, ALBUMIN,  in the last 168 hours Cardiac Enzymes No results found for this basename: TROPONINI, PROBNP,  in the last 168 hours Glucose  Recent Labs Lab 10/24/2013 0206 11/05/2013 0306 10/17/2013 0356 11/08/2013 0459 11/05/2013 0557 10/24/2013 0653  GLUCAP 439* 414* 415* 409* 370* 359*    Imaging No results found.   ASSESSMENT / PLAN:  PULMONARY Chronic Trach #6 XLT A: Acute on Chronic Respiratory Failure - r/t worsening HCAP, pleural effusion, OHS/OSA HCAP OSA ARDS P:   ARDS protocol Keep peep 18 , drop FIO2 as able Lasix to neg balance, May have a fixed shunt- at  some point, may need to reduce peep and observe  CARDIOVASCULAR R IJ CVL 9/13 >>  A:  CAD HLD Afib with RVR (possibly due to hypokalemia) Tachycardia hypotension P:   Cont to hold BB DNR if arrests If BP drops, have to back off lasix gtt   RENAL A:   Severe hypervolemia/anasarca Hypokalemia P:   Monitor BMET q 12 Monitor I/Os Correct electrolytes as indicated Lasix gtt to neg balance Not a candidate for HD   GASTROINTESTINAL A:   GERD Dysphagia due to trach tube LArge massive BM noted yeah!! P:   SUP: enteral famotidine Cont TFs +BM last pm  HEMATOLOGIC Anemia - likely of chronic disease  P:  DVT px: SQ heparin Limit phlebotomy  INFECTIOUS A:   Pseudomonas HCAP Yeast UTI  Strep pneumo bacteremia  Severe sepsis Elevated PCT- improving P:   BCx2 9/11 >>2/2 strep pneumo (RES ceftriaxone, PCN, sens levaquin) UC 9/11 >> multpile organisms Sputum 9/11 >> pseudomonas bld 9/18 >>ng  Abx: Vanco 9/11 >> 9/14 Diflucan 9/11 >> 9/14 Zosyn, 9/11 >>9/16 Levaquin 9/13>>> (for strep) add stop date 10 days 20th IMipenem 9/16>>9/16 ceftaz 9/16>>>plan 14 days  ENDOCRINE A:   DM 2 Hypothyroidism  P:   Continue Synthroid Placed on insulin gtt 9/20 - ( may have to go back to  SSI, resistant scale & Lantus  bid 30 )  NEUROLOGIC A:   Chronic Pain / Fibromyalgia Osteoarthritis  P:   RASS goal: -2  Continue home klonopin, zoloft Hold home fentanyl patch fent drip to remain, when peep to 10 , consider to int WUA  Summary - ARDS , good neg balance with lasix gtt but hypoxia not much improved,may be fixed shunt Husband contemplating withdrawal, daughter neesd convincing -may be harder since she is somewhat awake. More discussion on 9/20 planned  Care during the described time interval was provided by me and/or other providers on the critical care team.  I have reviewed this patient's available data, including medical history, events of note, physical  examination and test results as part of my evaluation  CC time x 42m  [name] MD. FCCP. [place] [facility] Pager [phone] If no response call [phone]

## 2013-11-09 NOTE — Progress Notes (Signed)
Nelda Bucks, MD Physician Signed Critical Care Progress Notes Service date: 10/26/2013 9:10 AM  Related encounter: Admission (Discharged) from 11/04/2013 in [facility] MEDICAL ICU  PULMONARY / CRITICAL CARE MEDICINE     Name: [name] MRN:  [id number] DOB:   [birth year]             ADMISSION DATE:  10/10/2013   REFERRING MD :  EDP   CHIEF COMPLAINT:  PNA, Respiratory Failure    INITIAL PRESENTATION: 68 y/o F, chronic trach/vent patient, admitted to [facility] 9/11 for L PNA.  Baseline ATC during day, vent QHS.      STUDIES:   9/11  CT Chest > trach in good position, diffuse consolidation on L, patchy infiltrates RLL, Flattening of main stem bronchi noted 9/15  Bronchoscopy- mod tracheal malacia, no major mucous plugging, mild thick pus L main (suctioned) 9/15  EKG-NSR, poor R wave progression   SIGNIFICANT EVENTS: 9/11  Admit with L HCAP, UTI 9/13 worsening resp status, requiring vent 24/7, tx ICU 9/15 mucous plug, desat 9/15 bronch, no major obsturction 9/16 - neg balance    SUBJECTIVE:   Per nursing,desats with turning in bed, elevated temp and HR, giving tylenol PRN   VITAL SIGNS: Temp:  [99.1 F (37.3 C)-102 F (38.9 C)] 100.6 F (38.1 C) (09/17 0836) Pulse Rate:  [99-128] 107 (09/17 0800) Resp:  [28-35] 35 (09/17 0800) BP: (88-132)/(37-109) 102/47 mmHg (09/17 0800) SpO2:  [90 %-97 %] 92 % (09/17 0800) FiO2 (%):  [60 %] 60 % (09/17 0800) Weight:  [255 lb 15.3 oz (116.1 kg)] 255 lb 15.3 oz (116.1 kg) (09/17 0600)   HEMODYNAMICS:   VENTILATOR SETTINGS: Vent Mode:  [-] PRVC FiO2 (%):  [60 %] 60 % Set Rate:  [35 bmp] 35 bmp Vt Set:  [400 mL] 400 mL PEEP:  [18 cmH20] 18 cmH20 Plateau Pressure:  [40 cmH20-47 cmH20] 43 cmH20   INTAKE / OUTPUT: Intake/Output Summary (Last 24 hours) at 10/26/13 0911 Last data filed at 10/26/13 0800   Gross per 24 hour   Intake    2235 ml   Output    3460 ml   Net   -1225 ml      PHYSICAL  EXAMINATION: General:  Chronically ill adult female in NAD Neuro: drowsy HEENT:  Mm pink/moist, poor dentition, #6 XLT trach midline c/d/i Cardiovascular: s1s2, RRR, no r/m/g Lungs:  resp's coarse, even/non-labored on vent Abdomen:  Round/soft, bsx4 active, PEG site fine Musculoskeletal:  No acute deformities, RUE, BLE swelling Skin:  Hot/dry, thin skin, multiple bruises / tears to upper extremities    LABS:   CBC Recent Labs Lab  10/23/13 0442  10/24/13 0515  10/25/13 0433   WBC  8.0  7.1  8.9   HGB  8.9*  8.6*  8.7*   HCT  29.8*  28.2*  28.5*   PLT  202  197  215    Coag's   No results found for this basename: APTT, INR,  in the last 168 hours   BMET Recent Labs Lab  10/24/13 0515  10/25/13 0433  10/26/13 0415   NA  142  146  142   K  3.6*  3.8  3.0*   CL  99  97  92*   CO2  33*  35*  36*   BUN  54*  60*  57*   CREATININE  0.52  0.59  0.59   GLUCOSE  258*  168*  289*    Electrolytes Recent Labs Lab  10/24/13 0515  10/25/13 0433  10/26/13 0415   CALCIUM  8.3*  8.7  8.8   MG   --    --   1.8   PHOS   --    --   2.3    Sepsis Markers Recent Labs Lab  10/17/2013 1224    10/22/13 0715  10/22/13 1242  10/23/13 0442   LATICACIDVEN  3.5*   --    --    --    --    PROCALCITON  29.93   < >  16.07  14.23  11.99    < > = values in this interval not displayed.   ABG Recent Labs Lab  10/23/13 0450  10/24/13 0700  10/24/13 0831   PHART  7.342*  7.271*  7.367   PCO2ART  60.1*  72.2*  65.2*   PO2ART  172.0*  169.0*  187.0*      Liver Enzymes Recent Labs Lab  10/31/2013 0558   AST  26   ALT  36*   ALKPHOS  89   BILITOT  0.3   ALBUMIN  2.7*    Cardiac Enzymes Recent Labs Lab  10/13/2013 0558  10/21/13 0525   PROBNP  2233.0*  2401.0*    Glucose Recent Labs Lab  10/25/13 1141  10/25/13 1551  10/25/13 1919  10/26/13 0022  10/26/13 0353  10/26/13 0803   GLUCAP  213*  198*  219*  281*  304*  283*      Imaging Dg Chest Port 1  View   10/25/2013   CLINICAL DATA:  Respiratory failure, shortness of breath  EXAM: PORTABLE CHEST - 1 VIEW  COMPARISON:  Portable chest x-ray of 10/24/2013  FINDINGS: There is little change in diffuse airspace disease consistent with edema, pneumonia, or ARDS. Tracheostomy is unchanged in position and heart size is stable  IMPRESSION: Little change in diffuse airspace disease.   Electronically Signed   By: Dwyane DeePaul  Barry M.D.   On: 10/25/2013 08:14       ASSESSMENT / PLAN:   PULMONARY Chronic Trach #6 XLT A: Acute on Chronic Respiratory Failure - r/t worsening HCAP, pleural effusion, OHS/OSA HCAP OSA ARDS P:    Cont vent bundle ARDS protocol Goal to peep 12 if able, may need to abort ARDs protocol if fixed shunt noted Keep same TV, want to reduce rate when able - abg in am   Keep plat less 30 Lasix to neg balance successful maintain and assess peep reduction, did go to 16 May have a fixed shunt at some point, may need to reduce peep and observe   CARDIOVASCULAR R IJ CVL 9/13 >>  A:   CAD HLD Afib with RVR (possibly due to hypokalemia) Tachycardia hypotension P:    Beta blocker addition tube (hold) Monitor lytes and correct DNR if arrests Diuresis to continue, is the priority over BB adminsiatrtion   RENAL A:    Severe hypervolemia/anasarca Hypokalemia ARDS P:    KCl 10m[facility] q h [facility] Monitor BMET intermittently Monitor I/Os Correct electrolytes as indicated Lasix to neg balance, successful, maintain , and reduce free water   Not a candidate for HD KVO   GASTROINTESTINAL A:    GERD Dysphagia due to trach tube P:    SUP: enteral famotidine Cont TFs assess last BM, need BM, start colace, myrlax   HEMATOLOGIC Anemia - likely of chronic disease   P:  DVT px: SQ heparin Limit phlebotomy   INFECTIOUS A:    Pseudomonas HCAP Yeast UTI   Strep pneumo bacteremia   Severe sepsis Elevated PCT- improving P:    BCx2 9/11 >>2/2 strep pneumo (RES  ceftriaxone, PCN, sens levaquin) UC 9/11 >> multpile organisms Sputum 9/11 >> pseudomonas   Abx: Vanco 9/11 >> 9/14 Diflucan 9/11 >> 9/14 Zosyn, 9/11 >>9/16 Levaquin 9/13>>> (for strep) add stop date 10 days 20th IMipenem 9/16>>9/16 ceftaz 9/16>>>plan 14 days     ENDOCRINE A:    DM 2 Hypothyroidism   P:    Continue Synthroid Cont SSI, resistant scale Cont Lantus and increase to 50   NEUROLOGIC A:    Chronic Pain / Fibromyalgia Osteoarthritis   P:    RASS goal: -2 as vent setting improve Continue home klonopin, zoloft Hold home fentanyl patch fent drip to remain, when peep to 10 , consider to int Will discuss with husband option for comfort today WUA  Ccm time 30 min    I have fully examined this patient and agree with above findings.     Mcarthur Rossetti. Tyson Alias, MD, FACP Pgr: [phone]  Pulmonary & Critical Care

## 2013-11-09 DEATH — deceased

## 2014-01-30 ENCOUNTER — Ambulatory Visit: Payer: Medicare Other | Admitting: Internal Medicine

## 2014-06-01 NOTE — Discharge Summary (Signed)
PATIENT NAME:  [name], [name] DATE OF BIRTH:  02-08-1946  DATE OF ADMISSION:  11/30/2012 DATE OF DISCHARGE: 12/08/2012   CONSULTANTS: Dr. Lady GaryFath from cardiology, Dr. Meredeth IdeFleming and Dr. Belia HemanKasa from pulmonary.  PRIMARY CARE PHYSICIAN: Dr. Clayborn BignessKatherine Bliss.   CHIEF COMPLAINT: Shortness of breath.  DIAGNOSES AT THE TIME OF DISCHARGE:  1.  Acute hypoxic hypercapnic respiratory failure, multifactorial, in the setting of Haemophilus pneumonia. 2.  Acute diastolic congestive heart failure. 3.  Chronic obstructive pulmonary disease. 4.  Severe obstructive sleep apnea, obesity and hypoventilation syndrome.  5.  Acute diastolic congestive heart failure.  6.  Hypotension, likely multifactorial, mainly secondary to sedative medications, resolved.  7.  Diabetes.  8.  History of Barrett's esophagitis.  9.  Clostridium difficile.  10. Urinary tract infection.  11. Morbid obesity.  12. Polymyalgia rheumatica with chronic prednisone therapy.  13. History of osteoporosis with T6 compression fracture. 14. Osteoarthritis. 15. Fibromyalgia. 16. Iron deficiency anemia.  17. History of constipation.  18. History of type 2 diabetes. 19. Glaucoma.  20. Obstructive sleep apnea on CPAP.  21. Hyperlipidemia.   CURRENT MEDICATIONS: Colace 100 mg oral q.12 hours, dorzolamide 1 drop b.i.d., lactulose 30 mL by gastric tube q.12 hours, levothyroxine 25 mcg daily, pantoprazole 40 mg IV q.a.m., senna 8.8 mg per 5 mL, please take 10 mL q.12 hours, heparin 5000 units q.8 hours, insulin sliding scale, Flagyl IV 500 mg q.8 hours and Rocephin 1 gram q.24 hours. The patient also does get 500 mg IV push once off Diamox.   SIGNIFICANT LABS AND IMAGING: C. difficile today positive. Initial glucose 155, BNP 364, BUN 11, creatinine 0.61,serum CO2 42. Last sodium 129, creatinine 0.62, BUN 28. Last serum CO2 42. Troponins were negative x 3. Initial LFTs showed AST of 11, ALT 23. Initial hemoglobin of 11.8, WBC of 10.1.  Last WBC of 19.4 with hemoglobin of 12.1. Initial blood cultures on October 22 showing coagulase-negative staph, 1 out of 2 bottles. Sputum cultures from October 22 grew Haemophilus species, heavy growth. Urine cultures grew E. coli on October 25. Blood cultures repeated on October 25, no growth to date. C. difficile again today positive. Initial VBG showed pH of 7.28, pCO2 of 115. Initial ABG on October 22 showed pH of 7.2, pCO2 of 120, pO2 of 74. Initial x-ray of the chest was a very limited study with findings suggestive of congestive heart failure. Echocardiogram on October 22 showing EF of 70% to 75%, normal EF, mild concentric LVH.   HISTORY OF PRESENT ILLNESS AND HOSPITAL COURSE: For full details of H and P, please see the dictation on October 22 by Dr. Randol KernElgergawy, but briefly this is a morbidly obese 69 year old female with multiple comorbidities including polymyalgia rheumatica on chronic prednisone, on CPAP, fibromyalgia, diabetes, COPD, who came in for shortness of breath, progressive lower extremity edema and was acidotic with hypercapnia and hypoxia, failed BiPAP therapy and ended up getting intubated. The patient was also given some Lasix. The patient was transferred to ICU and acute respiratory failure was deemed to be multifactorial. She was started on nebs. An echocardiogram was obtained and pulmonary and cardiology was consulted. The patient was deemed to have diastolic CHF in the setting of COPD, sleep apnea, obesity and hypoventilation syndrome. The patient was diuresing well with Lasix initially IV t.i.d., then b.i.d., then daily. An echocardiogram was obtained showing EF of 70%. She was also started on IV steroids, nebulizers and also an insulin drip. She was not on  any antibiotics initially. Blood cultures initially were 1 out of 2 positive for gram-positive and came back coag negative Staph. She was started on Rocephin as she started to spike fevers, around the 25th for Haemophilus, which  was growing in the sputum. Furthermore, the patient did have a positive UA. Haemophilus pneumonia also likely contributed to the patient's hypoxic and hypercapnic respiratory failure. Ultimately, the patient had good diuresis and current Lasix is on hold. Her lower extremity edema is better.   The patient did have progressive leukocytosis and today it was close to 20,000 and the patient again had blood cultures ordered as well as stool for C. difficile, which did come back positive. Initially, the patient had been started on vancomycin and Zosyn for broad coverage; however, since this morning they have been stopped and the patient has been transitioned to Flagyl. The patient should also be treated for the pneumonia and as she will be on Rocephin for the pneumonia, would likely need a more prolonged Flagyl course. She was successfully extubated on the 30th and [facility].   While hospitalized, she did have bouts of hypertension requiring low-dose pressors. Currently, they have been off.   CODE STATUS: The patient is a full code.   TOTAL TIME SPENT: 45 minutes.   ____________________________ Krystal Eaton, MD sa:aw D: 12/08/2012 13:59:55 ET T: 12/08/2012 14:19:08 ET JOB#: [id number]  cc: Krystal Eaton, MD, <Dictator> Burley Saver, MD Marcelle Smiling [facility] MD ELECTRONICALLY SIGNED 12/29/2012 2:59

## 2014-06-01 NOTE — H&P (Signed)
PATIENT NAME:  [name], [name] MR#:  [id number] DATE OF BIRTH:  12/03/1945  DATE OF ADMISSION:  11/30/2012  REFERRING PHYSICIAN: Dr. Lurline Hare.   PRIMARY CARE PHYSICIAN: Dr. Bernie Covey.   PRIMARY PULMONOLOGIST: As per husband's report, Dr. Annamaria Boots in [place].   CHIEF COMPLAINT: Shortness of breath.   HISTORY OF PRESENT ILLNESS: Ms. Firkus is a 69 year old morbidly obese female who lives at home, history of polymyalgia rheumatica and chronic prednisone use, history of obstructive sleep apnea on CPAP, history of fibromyalgia, diabetes who presents with shortness of breath. Husband reports symptoms have been going on for the last couple of days. The husband denies any reports of fever, chills, sweating, cough, any productive sputum, any hemoptysis, but he reports she had progressive lower extremity edema. In the ED, the patient was in severe respiratory distress. ABG showing a pH of 7.2 with pCO2 of more than 120. The patient was initially tried on BiPAP in the ED with no improvement of mental status, where she required intubation after that. Chest x-ray did show evidence of pulmonary edema with volume overload. The patient was afebrile. Her BNP was mildly elevated at 364. The patient does not carry a diagnosis of congestive heart failure in the past. Does not have history of COPD, only history of asthma. The patient's CO2 in the blood was at 42. The patient was given 1 dose of Lasix 40 mg in the ED without improvement of the patient's breathing prior to intubation. As well, she was given IV Solu-Medrol and nebulizer treatment as she had mild wheezing upon presentation. The patient is currently intubated, sedated. Hospitalist service was requested to admit the patient for further management of her respiratory failure.   PAST MEDICAL HISTORY:  1. Morbid obesity.  2. Polymyalgia rheumatica, on chronic prednisone therapy.  3. Osteoporosis with history of T6 compression fracture in September 2010.  4.  Osteoarthritis.  5. Fibromyalgia.  6. Gastroesophageal reflux disease.   7. Barrett's esophagitis.  8. Iron deficiency anemia.  9. Hyperlipidemia.  10. Constipation.  11. Type 2 diabetes mellitus.  12. Glaucoma.  13. Obstructive sleep apnea, on CPAP.   PAST SURGICAL HISTORY:  1. Tonsillectomy.  2. History of ectopic pregnancies with 4 miscarriages in the past.  3. Left eye surgery.  4. Partial hysterectomy.  5. Dilatation and curettage.   6. Kyphoplasty.   ALLERGIES TO MEDICATIONS: As per the previous documentation, she is allergic to:  1. SULFA.  2. CODEINE.  3. SODIUM.  4. BENZONATATE.  5. MORPHINE.  6. OFLOXACIN EYEDROPS.  7. LEVAQUIN.  8. LYRICA.   HOME MEDICATIONS:  1. Prednisone 10 mg oral daily.  2. Percocet 325/10 every 4 hours as needed.  3. Maalox as needed.  4. Diazepam 10 mg oral every 4 hours as needed.  5. Humalog sliding scale 4 times a day.  6. Lantus 40 units subcutaneous at bedtime.  7. Meclizine 25 mg oral every 6 hours as needed.  8. Promethazine 25 mg oral every 6 hours as needed.  9. Fluconazole 100 mg oral weekly.  10. Nystatin 4 times a day as needed.  11. Atorvastatin 40 mg oral daily.  12. Boniva 3 mg by 3 month IV kit.  13. Albuterol as needed.  14. Ketoconazole topical 2% to affected area 3 times a day.  15. Lasix 80 mg every 6 hours as needed.  16. Nizatidine 150 mg oral 2 capsules 2 times a day.  17. Dorzolamide ophthalmic solution 2% applied to affected eye 2  times a day.  18. Protonix 40 mg 2 tablets 2 times a day.  19. Levothyroxine 50 mcg oral daily.  20. Fentanyl patch 100 mcg per hour every 72 hours.   REVIEW OF SYSTEMS: Unable to obtain review of systems from the patient as she is currently intubated and sedated.    PHYSICAL EXAMINATION:  VITAL SIGNS: Temperature 98.5, pulse 110, respiratory rate 25, blood pressure 178/80, saturating 98% on vent assist control, volume control mode, tidal volume of 500, respiratory rate of 20,  PEEP of 5, FiO2 of 40%.  GENERAL: Morbidly obese female intubated, sedated.  HEENT: Head atraumatic, normocephalic. Pink conjunctivae. Anicteric sclerae. Intubated.  CHEST: The patient had decreased air entry bilaterally. No wheezing could be appreciated. No rales or rhonchi but respiratory sounds are diminished due to her morbid obesity.  NECK: Supple. No thyromegaly. No JVD.  CARDIOVASCULAR: S1, S2 heard. No rubs, murmurs or gallops.  ABDOMEN: Morbidly obese, soft, nontender, nondistended.  EXTREMITIES: Edema +1 bilaterally. No clubbing. No cyanosis. Dorsalis pedis pulse felt bilaterally.  NEUROLOGIC: Unable to assess as the patient is intubated and sedated.  PSYCHIATRIC: Unable to assess as the patient is intubated and sedated.  MUSCULOSKELETAL: No joint effusion or erythema appreciated.  LYMPHATICS: No cervical lymphadenopathy could be appreciated.   PERTINENT LABORATORIES: BNP 364. Glucose 155, BUN 11, creatinine 0.61, sodium 136, potassium 4, chloride 93, CO2 42. ALT 23, AST 11, alk phos 113. Troponin less than 0.02. White blood cells 10.1, hemoglobin 11.8, hematocrit 36.6, platelets 353.   ABG showing pH of 7.2, pCO2 of 120 and pO2 of 74. This is on BiPAP prior to intubation.   Chest x-ray initial evaluation showing bilateral pulmonary edema. Await the official reading by radiology.   ASSESSMENT AND PLAN:  1. Acute respiratory failure: Appears to be hypercarbic respiratory failure. This appears to be multifactorial. Most likely due to congestive heart failure. As well due to obesity hypoventilation syndrome. Currently will continue with the vent settings. Will consult pulmonary to manage the vent and will continue with diuresis and nebulizer treatments.  2. Acute congestive heart failure: The patient does not carry any prior diagnosis of congestive heart failure. Will start the patient on diuresis. Will check echocardiogram. Anticipate systolic versus right-sided heart failure or  pulmonary hypertension as well but will await for the echocardiogram. Will continue with diuresis. Will cycle her cardiac enzymes and will consult cardiology.  3. 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 Will switch her to intravenous Solu-Medrol.  4. Diabetes mellitus: Will start her on insulin sliding scale. Will hold on Lantus. If the patient becomes consistently hyperglycemic, will start her on insulin drip per intensive care unit protocol.  5. Hypothyroidism: Continue with Synthroid via nasogastric tube.  6. Glaucoma: Continue with eyedrops.  7. Deep vein thrombosis prophylaxis: Subcutaneous heparin.  8. History of Barrett's esophagitis, on gastrointestinal prophylaxis: Will continue the patient on proton pump inhibitor intravenous b.i.d.   CODE STATUS: The patient is FULL CODE.   TOTAL CRITICAL CARE TIME SPENT ON THIS PATIENT: 65 minutes.   ____________________________ Albertine Patricia, MD dse:gb D: 11/30/2012 03:15:51 ET T: 11/30/2012 04:15:02 ET JOB#: [id number]  cc: Albertine Patricia, MD, <Dictator> DAWOOD Graciela Husbands MD ELECTRONICALLY SIGNED 12/02/2012 3:05

## 2014-06-01 NOTE — Consult Note (Signed)
Brief Consult Note: Diagnosis: pt with morbid obesity, sleep apnea and oxygen dependent copd who was admitted with profound hypercapnea and hypoxia.   Patient was seen by consultant.   Recommend further assessment or treatment.   Comments: Pt with histoyr of obesity, oxygen dependent copd and sleep apnea admitted after being noted to be less responsive by her husband. She was noted to have severe hypercapnea and hypoxia on presentation. She was felt to also have volome overload. Echo revealed preserved lv funciton with pulmnary hypertension. She has ruled ou for a mi by cardiac markers. Etiology of event appears to be respiratory failure. Likely also has a component of diastollic heart failure. Careful with diuresis. Will also need to repleat K. Wean vent as tolerated. Further cardiac workup pending course.  Electronic Signatures: Dalia HeadingFath, Iyani Dresner A (MD)  (Signed 23-Oct-14 07:19)  Authored: Brief Consult Note   Last Updated: 23-Oct-14 07:19 by Dalia HeadingFath, Taetum Flewellen A (MD)

## 2014-06-03 NOTE — Consult Note (Signed)
PATIENT NAME:  [name], [name] MR#:  [id number] DATE OF BIRTH:  [birth year]  DATE OF CONSULTATION:  02/11/2011  REFERRING PHYSICIAN:  Dr. Sherryll BurgerShah CONSULTING PHYSICIAN:  Suszanne ConnersMichael R. Evelene CroonWolff, MD  REASON FOR CONSULTATION: Urinary hesitancy.   HISTORY OF PRESENT ILLNESS: [name] is a 69 year old Caucasian female who was admitted to the hospital with back pain, headache, and generally feeling poorly on December 23rd. She has had some hesitancy and strains to void since hospitalization. She has rather severe and significant and multiple medical problems which are well documented on the chart. She does not have a history of kidney stones. She may have a urinary tract infection, but I could not find culture documentation of that. Potential contributing factors to the hesitancy would be the fact that she is essentially bedridden due to back pain and obesity, she has compression fractures of her spine, she has fibromyalgia, and she has shingles involving the sacral distribution of her spinal cord.   ALLERGIES: Sulfa, codeine, sodium benzonatate, morphine, ofloxacin eyedrops, Levaquin and Lyrica.    CHRONIC HOME MEDICATIONS: Levothyroxine, albuterol, Percocet, fentanyl patches, insulin, promethazine, Lasix, Protonix, nizatidine, diazepam, nystatin, prednisone, fluconazole, ketoconazole, meclizine, oxygen, CPAP, Boniva, latanoprost eyedrops, Maalox, atorvastatin.   PAST SURGICAL HISTORY:  1. Hysterectomy.  2. Tonsillectomy.  3. Ectopic pregnancy.  4. Dilatation and curettage. 5. Eye surgery. 6. Kyphoplasty.   PAST AND CURRENT MEDICAL CONDITIONS:  1. Morbid obesity.  2. Polymyalgia.  3. Osteoporosis with T6 compression fracture.  4. Osteoarthritis.  5. Fibromyalgia.  6. Gastroesophageal reflux disease.  7. Barrett's esophagus.  8. Anemia.  9. Hyperlipidemia.  10. Chronic constipation.  11. Diabetes.  12. Glaucoma.  13. Sleep apnea.   PHYSICAL EXAMINATION: Deferred.   PERTINENT LABORATORY STUDIES:  BUN of 7 and creatinine of 0.61 on 02/08/2011.   IMPRESSION: Urinary hesitancy.   PLAN:  1. Suggest in and out catheterization, and if the residual is greater than 200 mL leave Foley in until she has recuperated from her acute medical problems.  2. It is doubtful that she has a urological/surgical issue causing the hesitancy at this time, but certainly the above-mentioned comorbidities could cause urinary hesitancy.    ____________________________ Suszanne ConnersMichael R. Evelene CroonWolff, MD mrw:cbb D: 02/11/2011 17:20:45 ET T: 02/11/2011 19:56:55 ET JOB#: [id number]  cc: Suszanne ConnersMichael R. Evelene CroonWolff, MD, <Dictator> Orson ApeMICHAEL R British Moyd MD ELECTRONICALLY SIGNED 02/16/2011 7:39

## 2014-06-03 NOTE — Consult Note (Signed)
PATIENT NAME:  [name], [name] MR#:  [id number] DATE OF BIRTH:  20-Mar-1945  DATE OF CONSULTATION:  02/09/2011  REFERRING PHYSICIAN:  Delfino LovettVipul Shah, MD CONSULTING PHYSICIAN:  Rosalyn GessMichael E. Tiyanna Larcom, MD  REASON FOR CONSULTATION: Possible zoster.   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 The patient has had long-standing back pain for years. However, over the last three weeks her pain has significantly worsened. She was in bed for a lot of the last few weeks due to her worsening discomfort. She has had increased pain in the perirectal area as well as fever for the last several days. She states that she has chronic chills and sweats and this has not significantly changed. She says her temperature has gone as high as 100, but not significantly more than that. She also states that she has been having difficulty urinating. She has not had dysuria. She has not had any hematuria, but has had trouble getting the urine to flow. She has not had any increased frequency that she can determine. She is unable to tell if she has new flank pain as her back pain has been significantly worse. Most of her back pain however is in the middle of her back. She does state that she has had significant perianal pain. She denies any rashes prior to being admitted, however, a rash was detected on exam over the right buttock area. On the admission History and Physical, there was noted to be mild erythema in the perirectal region bilaterally, but there was no comment of any specific ulcerations. She was started on ceftriaxone for possible urinary tract infection. A CT scan of the abdomen and pelvis demonstrated a complex lesion in the left pelvis. A subsequent ultrasound of  the pelvis noted a large septated lobulated cystic mass of the left adnexa. She was seen by OB/GYN and the brief consult note indicated the presence of blisters. These have subsequently become ulcerations. She was started empirically on acyclovir, however, no cultures were obtained. The patient continues to have perianal pain as well as difficulty with urination. The ulcers themselves are not particularly tender. She has remained afebrile during her hospitalization. Metronidazole was added to the ceftriaxone well after the ultrasound results. She continues on chronic prednisone.   ALLERGIES: Benadryl, caffeine, codeine, Levaquin, and morphine.   PAST MEDICAL HISTORY:  1. Diabetes.  2. Morbid obesity.  3. Polymyalgia rheumatica, on chronic prednisone.  4. Osteoporosis with compression fractures.  5. Osteoarthritis.  6. Fibromyalgia.  7. Gastroesophageal reflux.  8. Barrett's esophagus.  9. Iron deficiency anemia.  10. Hypercholesterolemia.  11. Chronic constipation.  12. Sleep apnea.  13. Status post kyphoplasty.   SOCIAL HISTORY: The patient lives at home with her husband. She does not smoke. She does not drink.   FAMILY HISTORY: Positive for diabetes and coronary artery disease.  REVIEW OF SYSTEMS: GENERAL: Positive for fever up to 100 degrees. Chills and sweats are also present but these have been present for a long time. Positive malaise. Positive fatigue. HEENT: No headache. No sinus congestion. No sore throat. NECK: No stiffness. No swollen glands. RESPIRATORY: No cough. No shortness of breath. No sputum production. CARDIAC: No chest pain or palpitations. GI: No nausea, no vomiting, and no abdominal pain. She has chronic constipation, which has been  worse. She has had perianal pain that has been worse than her usual pain. MUSCULOSKELETAL: She has worsening back pain than her typical. She has chronic joint discomfort as well as muscle discomfort. These are not particularly changed. SKIN:  She has developed ulcers on the right buttock. NEUROLOGIC: No focal weakness. PSYCHIATRIC: No complaints. All other systems are negative.   PHYSICAL EXAMINATION:  VITAL SIGNS: T-max 99.3, T-current 97.7, pulse 81, blood pressure 99/56, and saturation 92% on 3 liters.   GENERAL: A 69 year old white female, obese, in no acute distress.   HEENT: Normocephalic, atraumatic. Extraocular motion appears intact. Sclerae are without evidence for emboli or petechiae.   NECK: Supple. Full range of motion. Midline trachea. No lymphadenopathy. No thyromegaly.   LUNGS: Clear to auscultation bilaterally with good air movement. No focal consolidation.   HEART: Distant S1 and S2, regular rate and rhythm without murmur, rub, or gallop.   ABDOMEN: Obese, soft, nontender, and nondistended. No hepatosplenomegaly. No hernia is noted.   EXTREMITIES: No evidence for tenosynovitis.   SKIN: She had several ulcerated areas that were fairly shallow over the right buttock. They did not extend down to the posterior or lateral thigh. There were no other lesions on the lower back. The lesions were all unilateral. There was no purulence noted. There was some minimal erythema surrounding these lesions. They ranged from a few millimeters to about 1.5 cm in size. They were not particularly tender to palpation. There were no eschars or vesicles associated with this.   NEUROLOGIC: The patient was awake and interactive, moving all four extremities.   PSYCHIATRIC: Mood and affect appeared normal.   LABS/STUDIES: BUN 7 and creatinine 0.61. White count 7.9, hemoglobin 12.1, platelet count 251, and ANC 4.4.   Blood cultures from 02/01/2011 show no growth.   Urinalysis had negative nitrites, 2+ leukocyte esterase, 4 red cells and 23 white cells per high-power field.   Urine culture is negative.   Chest x-ray from admission showed low-grade congestive heart failure.   Thoracic spine CT showed multiple compressions of the lower  thoracic spine.   CT scan of the abdomen and pelvis with contrast showed a left inguinal hernia containing loops of small bowel without evidence for incarceration or obstruction. There was bilobed fluid density structure in the left aspect of the pelvis, which was not new but had increased in size since the prior study.  An ultrasound of the pelvis demonstrated an enlarging septated lobulated cystic mass of the left adnexa.   IMPRESSION: A 69 year old white female with a history of diabetes and polymyalgia rheumatica on chronic steroids admitted with severe back pain, urinary hesitation, perianal pain and developed right buttock rash and who was found to have an adnexal mass.   RECOMMENDATIONS:  1. No culture was obtained of the rash when it was first discovered. While the rash could be in the sacral nerve dermatomal position there are currently no vesicles nor eschars. It is one-sided which would be consistent with zoster. She has pain referred to the anal area which would also be consistent with the sacral nerve involvement. She has urinary hesitancy which has been described with zoster in this distribution. Therefore, I cannot rule out zoster as an etiology. The persistence of the ulcers could be prolonged due to her chronic steroid use.  2. She has not noticed any ulcers prior to her hospitalization and acyclovir was started a few days into her hospitalization. Therapy has been shown to decrease the length of illness by about  one day and to decrease postherpetic neuralgia. We will change her to Valtrex orally.  3. She does not have a urinary tract infection. She has no dysuria and her culture was negative.  4. She has a complex ovarian cyst, but this does not appear to be an abscess. Malignancy is a possibility.  5. We will stop her antibiotics.  6. We will get a viral culture of the rash. If positive this would confirm the diagnosis. A negative culture would not necessarily rule out zoster given  her therapy over the last several days.  7. We will continue airborne and contact isolation.   Thank you very much for involving me in [name]'s care. This is a moderately complex infectious disease case.  ____________________________ Rosalyn Gess. Jerrel Tiberio, MD meb:slb D: 02/09/2011 12:41:32 ET      T: 02/09/2011 13:02:15 ET         JOB#: [id number]  Aireona Torelli E Karri Kallenbach MD ELECTRONICALLY SIGNED 02/09/2011 14:43

## 2014-06-03 NOTE — Discharge Summary (Signed)
PATIENT NAME:  [name], [name] MR#:  [id number] DATE OF BIRTH:  Jul 06, 1945  DATE OF ADMISSION:  02/01/2011 DATE OF DISCHARGE:  02/12/2011  PRIMARY CARE PHYSICIAN:  Dr Clayborn Bigness HEMATOLOGY:  Dr Haze Rushing.   GI:  Dr Mechele Collin.   UROLOGY:  Dr Anola Gurney.    DISCHARGE DIAGNOSES:  1. Back pain, likely secondary to compression fracture at T9 thoracic spine shown on CT scan of the thoracic spine showing loss of height at T9.  Patient refusing MRI due to claustrophobia and cannot do open MRI as she likely has shingles. The patient has been fitted with Jewett hyperextension brace by orthopedist, Dr Ernest Pine.  2. Urinary tract infection, treated with antibiotics.  3. Left adnexal mass. Will require outpatient OB/GYN followup.  4. Herpes zoster/shingles, on Valtrex.  5. Hypokalemia and hypomagnesemia. Repleted and resolved.  SECONDARY DIAGNOSES:  1. Morbid obesity.  2. Polymyalgia rheumatica, on chronic prednisone.  3. Osteoporosis with history of compression fracture of T6 in September 2010.  4. Osteoarthritis.  5. Fibromyalgia.    6. Gastroesophageal reflux disease.  7. Barrett esophagitis.  8. Iron deficiency anemia.  9. Hyperlipidemia.  10. Constipation.  11. Type 2 diabetes.   12. Glaucoma.  13. Obstructive sleep apnea requiring CPAP.   CONSULTATIONS:  1. Physical Therapy.  2. Surgery, Dr. Michela Pitcher.  3. Orthopedics, Dr Ernest Pine.  4. Infectious Disease, Dr. Leavy Cella.  5. Urology, Dr. Evelene Croon.   6. GI, Dr. Mechele Collin.  PROCEDURES/RADIOLOGY: Please see Dr. Orson Slick Interim Discharge Summary dictated on February 08, 2011 after which there were no more new procedures or radiology done.   MAJOR LABORATORY PANEL: After Dr Orson Slick Interim Discharge Summary for his zoster virus culture was negative on February 10, 2011.   HISTORY AND SHORT HOSPITAL COURSE: Please see Dr. Orson Slick Interim Discharge Summary dictated on December 30 for the course from admission until February 08, 2011.  In brief the  patient is a 69 year old female with above-mentioned medical problems who was admitted for severe back pain. Underwent CT scan of the thoracic spine without contrast which showed loss of height at T9 suggestive of possible compression fracture. The patient was evaluated by Orthopedics, Dr Gerrit Heck and Dr Ernest Pine who recommended MRI but patient refused the MRI twice mainly due to claustrophobia.  Dr. Anson Fret. Ernest Pine did not recommend getting any kyphoplasty or procedures without MRI results. We also tried to coordinate her to get open MRI at [facility] but she had a possible shingles lesion in her buttocks due to which she was unable to get that open MRI, would not accept her for MRI.    Infectious disease consult was obtained with Dr. Leavy Cella who also felt that she will need to be treated for possible shingles and her acyclovir was changed to Valtrex as per Dr. Sharrell Ku recommendation. He also recommended culturing for HSV, which was done. Cultures did come negative.   The patient was very hesitant to leave the hospital. Physical Therapy recommended rehab including most of the consultants which patient refused to go as she did not find the place which she wanted although she did have 4 other rehab bed offers, which she refused to go. She wanted to stay in the hospital but there was no acute medical condition. Her pain was fairly well controlled on ongoing pain medication regimen, and after discussion with consultants she was discharged home with home health nursing and physical therapy on February 12, 3011. The patient continued to complain of something new every day.  On February 11, 2011, she was very difficult and requested urology consult for complaints of urinary hesitancy/incontinence for which Dr. Anola GurneyMichael Wolff came by and recommended in and out catheter 3 times a day versus indwelling Foley which the patient refused both of those. She also started complaining of rectal pain for which she requested Dr.  Mechele CollinElliott consult, which was also obtained; and Dr. Mechele CollinElliott did not recommend any further medication. He recommended a bowel regimen to make sure she is not constipated and possible outpatient colonoscopy.  After discussion with all consultants and Care Management, discharge was planned for February 12, 2011, as at this point we could not offer any more acute medical care in the hospital even though patient was not too happy about leaving the hospital. Her medication management was optimized, and she was discharged home in stable condition.   PHYSICAL EXAMINATION:  VITAL SIGNS: On the date of discharge her vital signs were as follows: Temperature 97.9, heart rate 86 per minute, respirations 20 per minute, blood pressure 106/65 on repeat check it was 149/81. She was saturating 96% on 2L oxygen via nasal cannula which is her baseline.   Pertinent physical examination on the date of discharge:   CARDIOVASCULAR: S1 and S2 normal. No murmur, rubs, or gallop.   LUNGS: Clear to auscultation bilaterally. Decreased breath sounds at the bases.   NEUROLOGIC: Nonfocal examination.    ABDOMEN: Soft, obese, benign.   PSYCHIATRIC: The patient cries very easily on talking and gets very frustrated and was not too happy.   All other physical examination remained at the baseline.   DISCHARGE MEDICATIONS:  1. Tizanidine 300 mg p.o. daily.  2. Fentanyl 100 mcg/hour extended-release every three days.  3. TobraDex ophthalmic suspension twice a day 1 drop in both eyes.  4. Valium 10 mg p.o. every four hours as needed.  5. Carafate 1 gram p.o. 3 times a day.  6. Cozaar 50 mg p.o. daily.  7. Insulin Lantus 40 units subcutaneous at bedtime.  8. Protonix 40 mg 2 tablets p.o. b.i.d.  9. Percocet 10/650 one tablet p.o. every four hours as needed.  10. Atorvastatin 40 mg p.o. at bedtime.  11. Latanoprost 0.005% ophthalmic solution 1 drop to each eye once at bedtime.  12. Albuterol nebulizer as needed.  13. Albuterol  inhaler as needed.  14. Promethazine 25 mg p.o. every 6 hours as needed.  15. Lasix 80 mg p.o. every 6 hours as needed.  16. Prednisone 10 mg p.o. daily.  17. Fluconazole 100 mg once a week.  18. Ketoconazole topical 2% cream 3 times a day as needed.  19. Meclizine 25 mg p.o. every 6 hours as needed.  20. Humalog subcutaneous 4 times a day sliding scale.  21. Please update tizanidine dose as 300 mg twice a day.  22. Nystatin 5 mL p.o. 4 times a day as needed. 23. Boniva IV once a month.  24. Maalox daily as needed.  25. Valtrex 1000 mg p.o. b.i.d. for 7 days.  26. Dilaudid 2 mg p.o. every 8 hours as needed, 15 tablets provided, no refill.  27. CPAP at night.   DISCHARGE DIET: Low sodium, 1800 ADA.   DISCHARGE ACTIVITY: As tolerated okay to mobilize with brace in place under the direction of Physical Therapy.   DISCHARGE INSTRUCTIONS AND FOLLOWUP: The patient was instructed to follow up with her primary care physician, Dr. Clayborn BignessKatherine Bliss, in 1 to 2 weeks. She will need 2 to 3 liters oxygen via nasal cannula continuous at home. She  will need followup with her hematology doctor, Dr Haze Rushing in 3 to 4 weeks, with Dr. Mechele Collin in 2 to 3 weeks, with Dr. Anola Gurney from Urology in 1 to 2 weeks. Followup with [facility] in 2 weeks for evaluation of her left adnexal mass. She will need followup with [facility]/Dr. Gerrit Heck here at [facility] for evaluation of her back in 2 to 3 weeks. She was set up to get home health nursing and physical therapy.             TOTAL TIME DISCHARGING THIS PATIENT: 55 minutes.    With copies include copy of Interim Discharge Summary dictated by Dr Lafayette Dragon on February 08, 2011.    ____________________________ Ellamae Sia. Sherryll Burger, MD vss:vtd D: 02/16/2011 10:28:50 ET T: 02/17/2011 15:05:48 ET JOB#: [id number]  cc: Lera Gaines S. Sherryll Burger, MD, <Dictator> Burley Saver, MD Legrand Pitts, MD Scot Jun, MD Suszanne Conners. Evelene Croon, MD [place] Gerrit Heck, MD Illene Labrador. Angie Fava., MD   Ellamae Sia [facility] MD ELECTRONICALLY SIGNED 02/17/2011 19:52

## 2014-06-03 NOTE — Consult Note (Signed)
PATIENT NAME:  [name], [name] MR#:  [id number] DATE OF BIRTH:  1945-02-16  DATE OF CONSULTATION:  02/12/2011  REFERRING PHYSICIAN:   CONSULTING PHYSICIAN:  Scot Junobert T. Juliyah Mergen, MD  HISTORY OF PRESENT ILLNESS: The patient is a 69 year old white female well known to me from previous colonoscopy and upper endoscopy in 2010 and 2006. She has been in the hospital for a couple of weeks and she requested that I see her about her abdomen before discharge and the hospitalist was wanting to be sure there was no acute intraabdominal problem that would be the reason for her not to go home.   The patient is very obese, weighs a little over 300 pounds. She has multiple medical problems that are well outlined in other parts of the chart. She has a hernia that is not strangulating She has dysphagia but her weight is well maintained. She has a history of constipation related to pain medicine she has to take because of multiple compression fractures of her thoracic spine.   She had an upper endoscopy 2-1/2 years ago showing Barrett's esophagus. She had a colonoscopy in 2005 with some adenomatous polyps. The patient was recommended to come to the office in 2010 to set up repeat colonoscopy but declined to do so.   She currently has a rash in the perianal/perirectal area that is suspicious for herpes zoster and she has been on isolation for this. She had been followed by Infectious Disease, Orthopedics, and Urology.   PHYSICAL EXAMINATION: Very obese white female with a large abdomen. There is mild tenderness in most areas of palpation. I cannot palpate any organomegaly and no area is more tender than any other area. Rectal exam shows rash, small superficial ulcerated spots. Rectal exam showed no abnormality.   LABORATORY, DIAGNOSTIC, AND RADIOLOGICAL DATA: Potassium 4.1. Magnesium 1.9 on last determination. Platelet count 260. Her last white count on December 30th was 7.9, hemoglobin 12, platelet count 251. Her last BUN  was 7, creatinine 0.6.   ASSESSMENT: The patient will eventually need colonoscopy and upper endoscopy. She does not need to stay in the hospital for these. As a matter of fact, it would be better not to do these while the viral-type skin disorder is being treated. I see no reason from a gastrointestinal standpoint she cannot go home and recommend she follow-up with us in a few months in the office.  ____________________________ Scot Junobert T. Jourdin Connors, MD rte:drc D: 02/12/2011 17:48:43 ET T: 02/12/2011 18:11:52 ET JOB#: [id number]  cc: Scot Junobert T. Tanmay Halteman, MD, <Dictator> Scot JunOBERT T [place] Rollyson MD ELECTRONICALLY SIGNED 02/19/2011 12:12

## 2014-06-03 NOTE — Consult Note (Signed)
Pt interviewed and examined abdomen and rectal area and rectal exam.  My imput is limited to these areas.  I see no reason she cannot go home and be cared for by home health.  No elective EGD or colonoscopy for at least 3 months to allow the shingles to heal.  Discussed pain medication, laxatives, colon and EGD with patient when she gets over the skin problem.  Advised to eat slowly chew well, small bites.  Pt did not want to go to nursing home because her mother went and she has bad memories of this.  See dictated note.  Electronic Signatures: Scot JunElliott, Iram Astorino T (MD)  (Signed on 03-Jan-13 16:33)  Authored  Last Updated: 03-Jan-13 16:33 by Scot JunElliott, Hani Patnode T (MD)

## 2014-06-03 NOTE — Consult Note (Signed)
Brief Consult Note: Diagnosis: Urinary hesitancy.   Patient was seen by consultant.   Consult note dictated.   Recommend further assessment or treatment.   Discussed with Attending MD.   Comments: In and out cath tid vs. indwelling foley.  Electronic Signatures: Orson ApeWolff, Michael R (MD)  (Signed 02-Jan-13 17:19)  Authored: Brief Consult Note   Last Updated: 02-Jan-13 17:19 by Orson ApeWolff, Michael R (MD)

## 2015-03-30 IMAGING — CR DG CHEST 1V PORT
1 series · 2 of 2 positions shown · non-contrast
Comparison: none

REASON FOR EXAM: RESP DISTRESS
COMMENTS:

[Series 1: ap · 0.17mm/px · 2 of 2 slices shown]
[im 1/2]
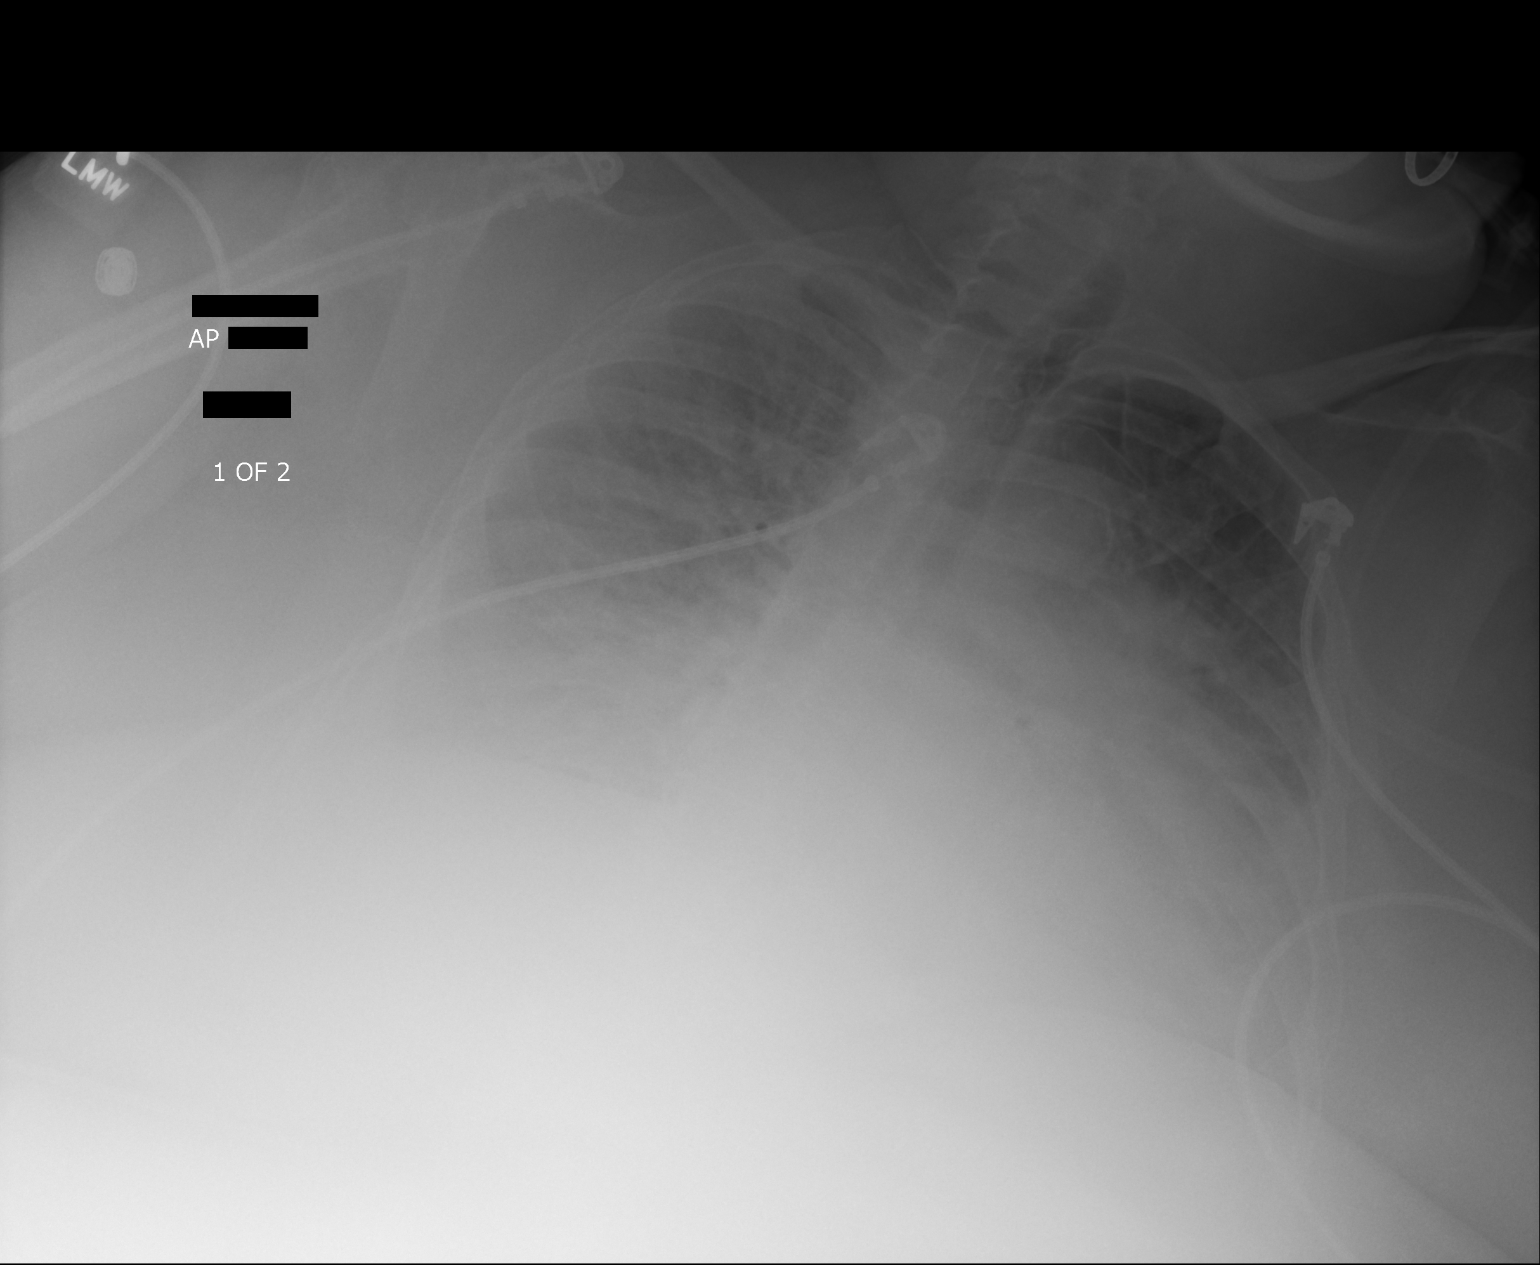
[im 2/2]
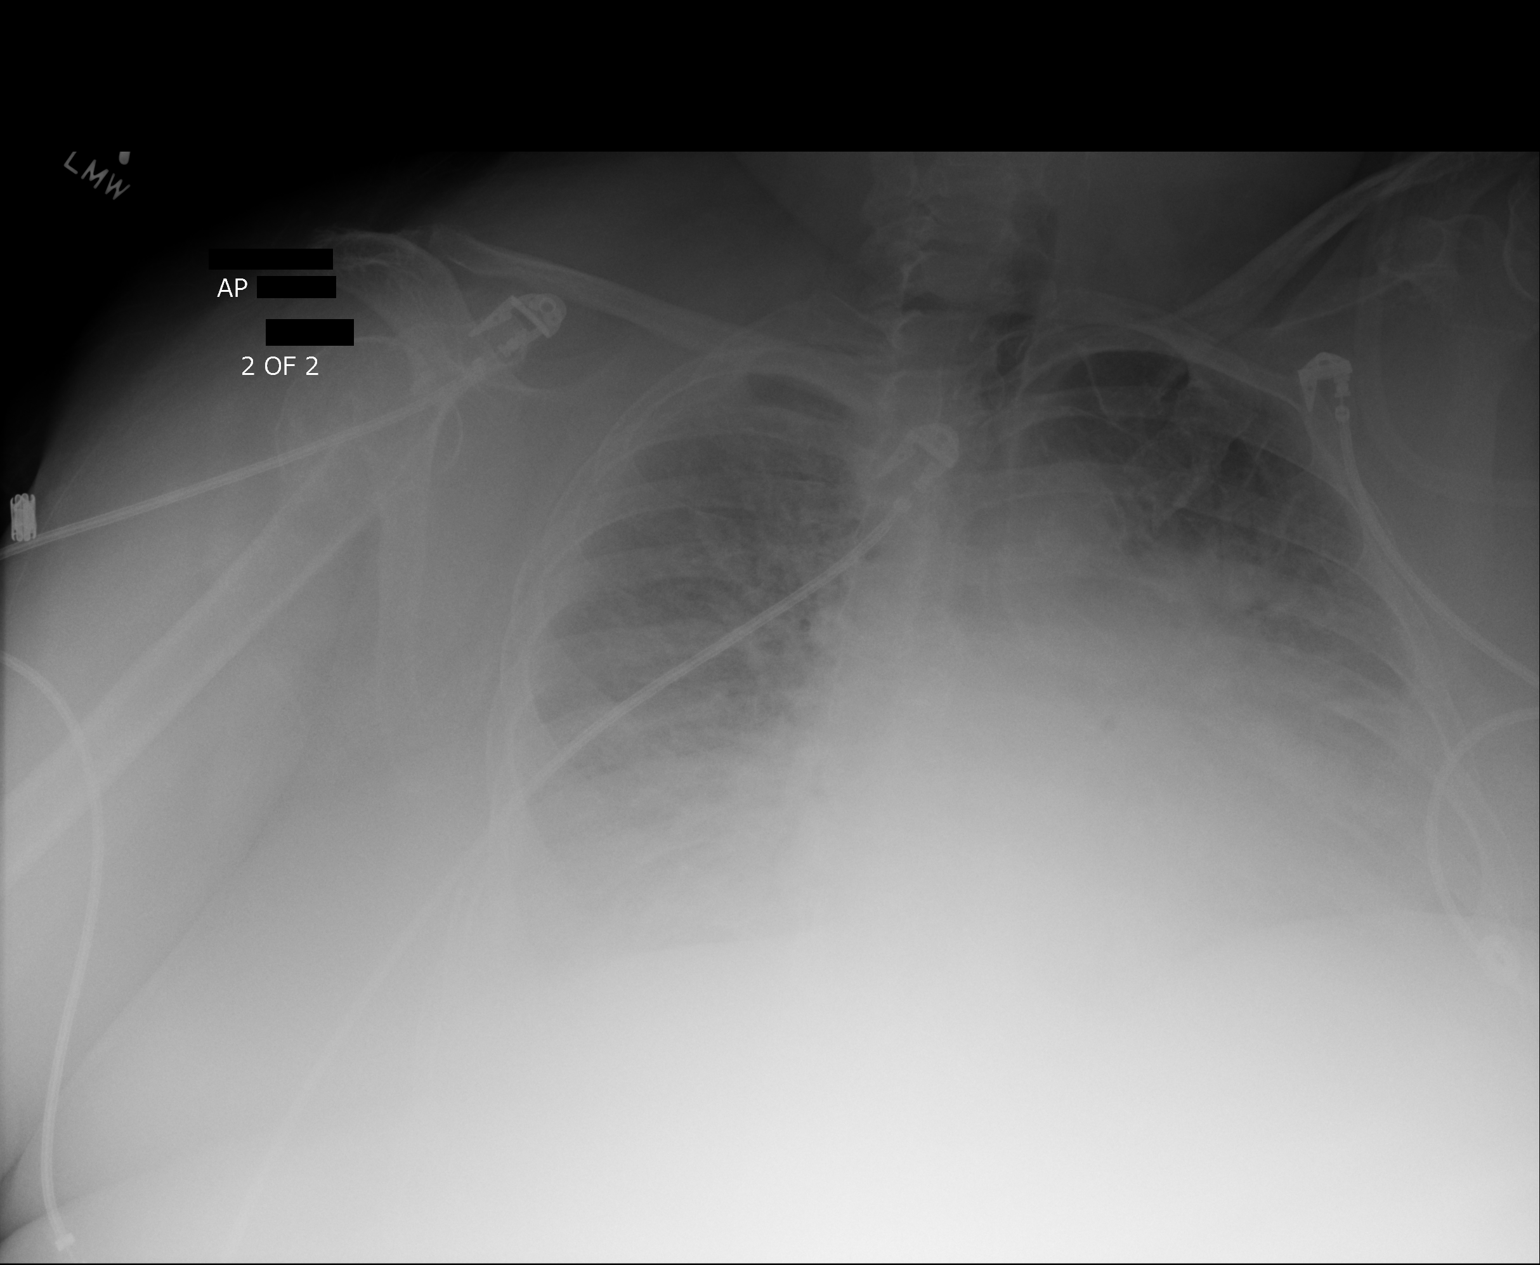

[2 of 2 positions shown; findings below may reference images not displayed]

PROCEDURE:     DXR - DXR PORTABLE CHEST SINGLE VIEW  - November 30, 2012 [DATE]

RESULT:     The patient has previous study of 02/01/2011 with better
exposure. There is motion artifact because of portable technique. There is
shallow inspiration with evidence of diffuse edema. The cardiac silhouette
appears to be enlarged but is difficult to accurately characterize. No large
effusion is evident.
IMPRESSION: Technically very limited study with findings suggestive of
congestive heart failure.

[REDACTED]

## 2015-03-31 IMAGING — CR DG CHEST 1V PORT
1 series · 1 of 1 positions shown · non-contrast
Comparison: none

REASON FOR EXAM: central line placement
COMMENTS:

[ap]
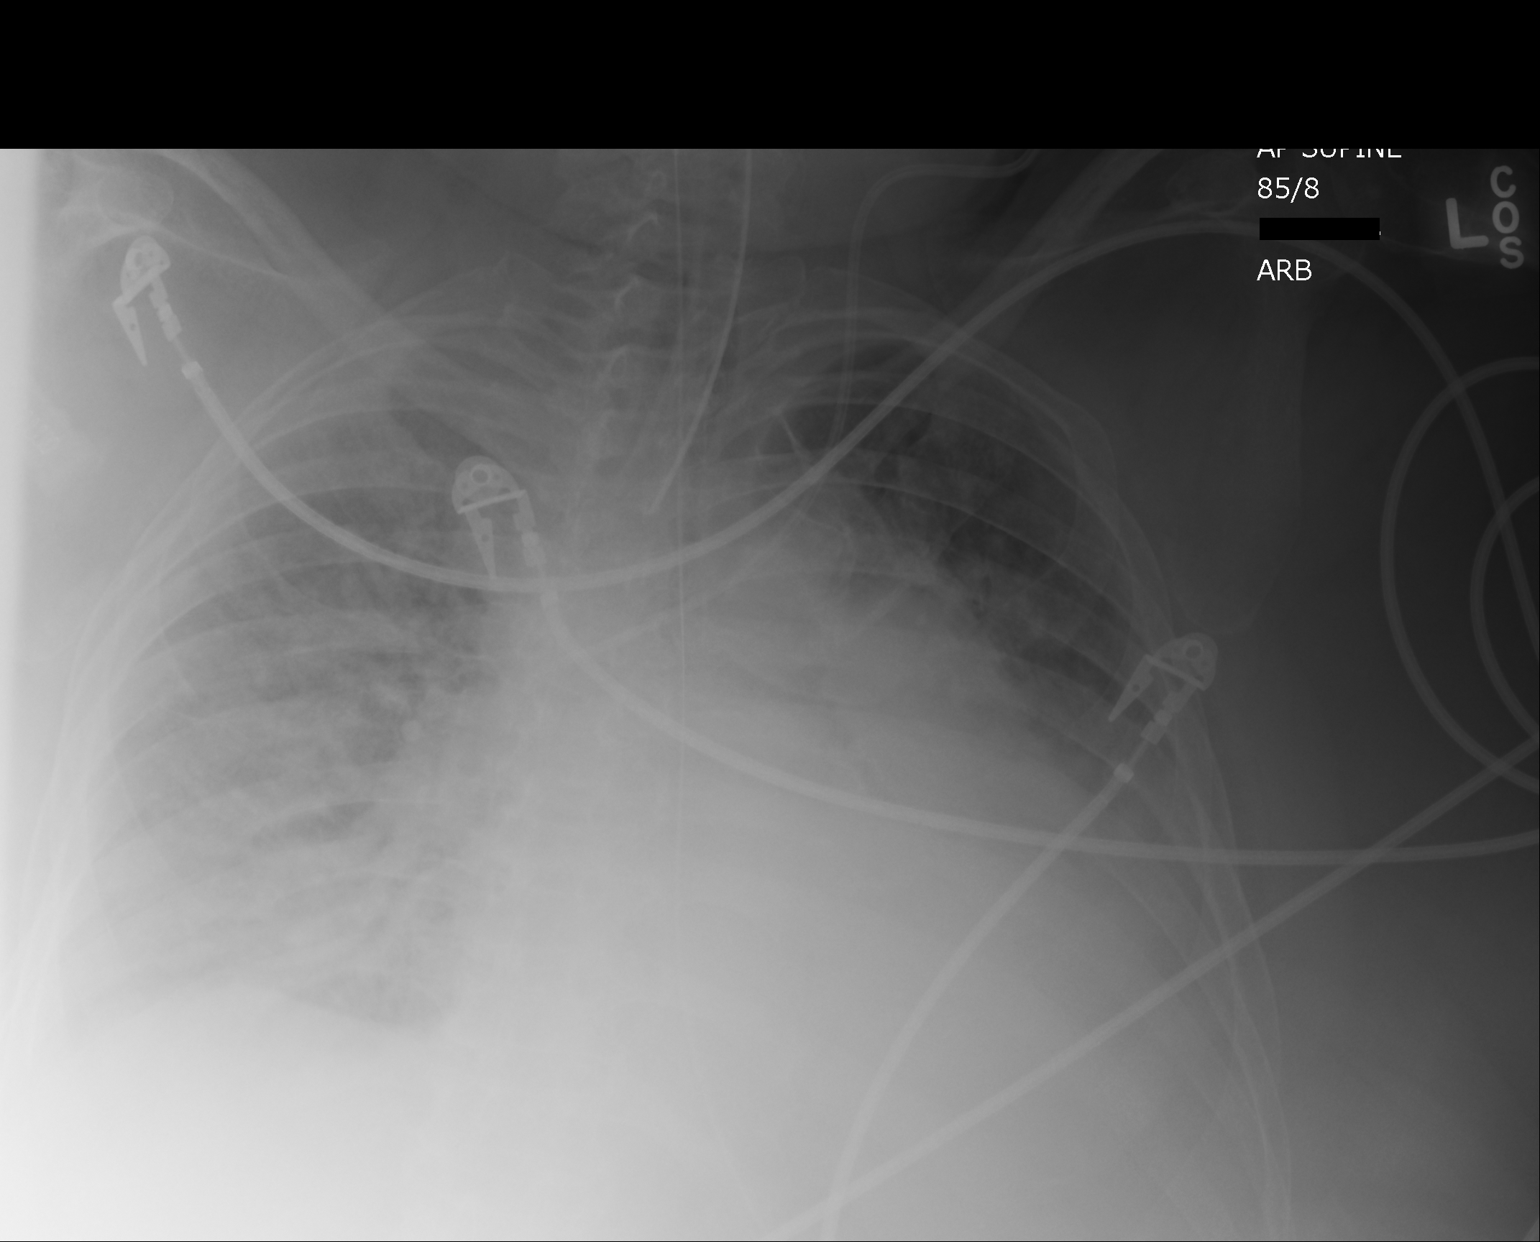

[1 of 1 positions shown; findings below may reference images not displayed]

PROCEDURE:     DXR - DXR PORTABLE CHEST SINGLE VIEW  - December 01, 2012 [DATE]

RESULT:     Comparison is made to the study of today's date at [DATE] a.m.

The endotracheal tube tip lies at the level of the inferior margin of the
clavicular heads. The esophagogastric tube tip projects off the film. There
has been interval placement of a left internal jugular venous catheter whose
tip lies at the level of the junction of the right and left brachiocephalic
veins. There is no evidence of a post procedure pneumothorax. The pulmonary
interstitium remains dense consistent with edema. The cardiac silhouette
remains enlarged and poorly defined. The left hemidiaphragm remains obscured.
IMPRESSION: 1. There is no evidence of a post procedure complication following placement
of a left internal jugular venous catheter.
2. The findings are consistent with CHF and bilateral pulmonary edema.

[REDACTED]

## 2015-03-31 IMAGING — CR DG OUTSIDE FILMS CHEST
1 series · 1 of 1 positions shown · non-contrast
Comparison: none

[ap]
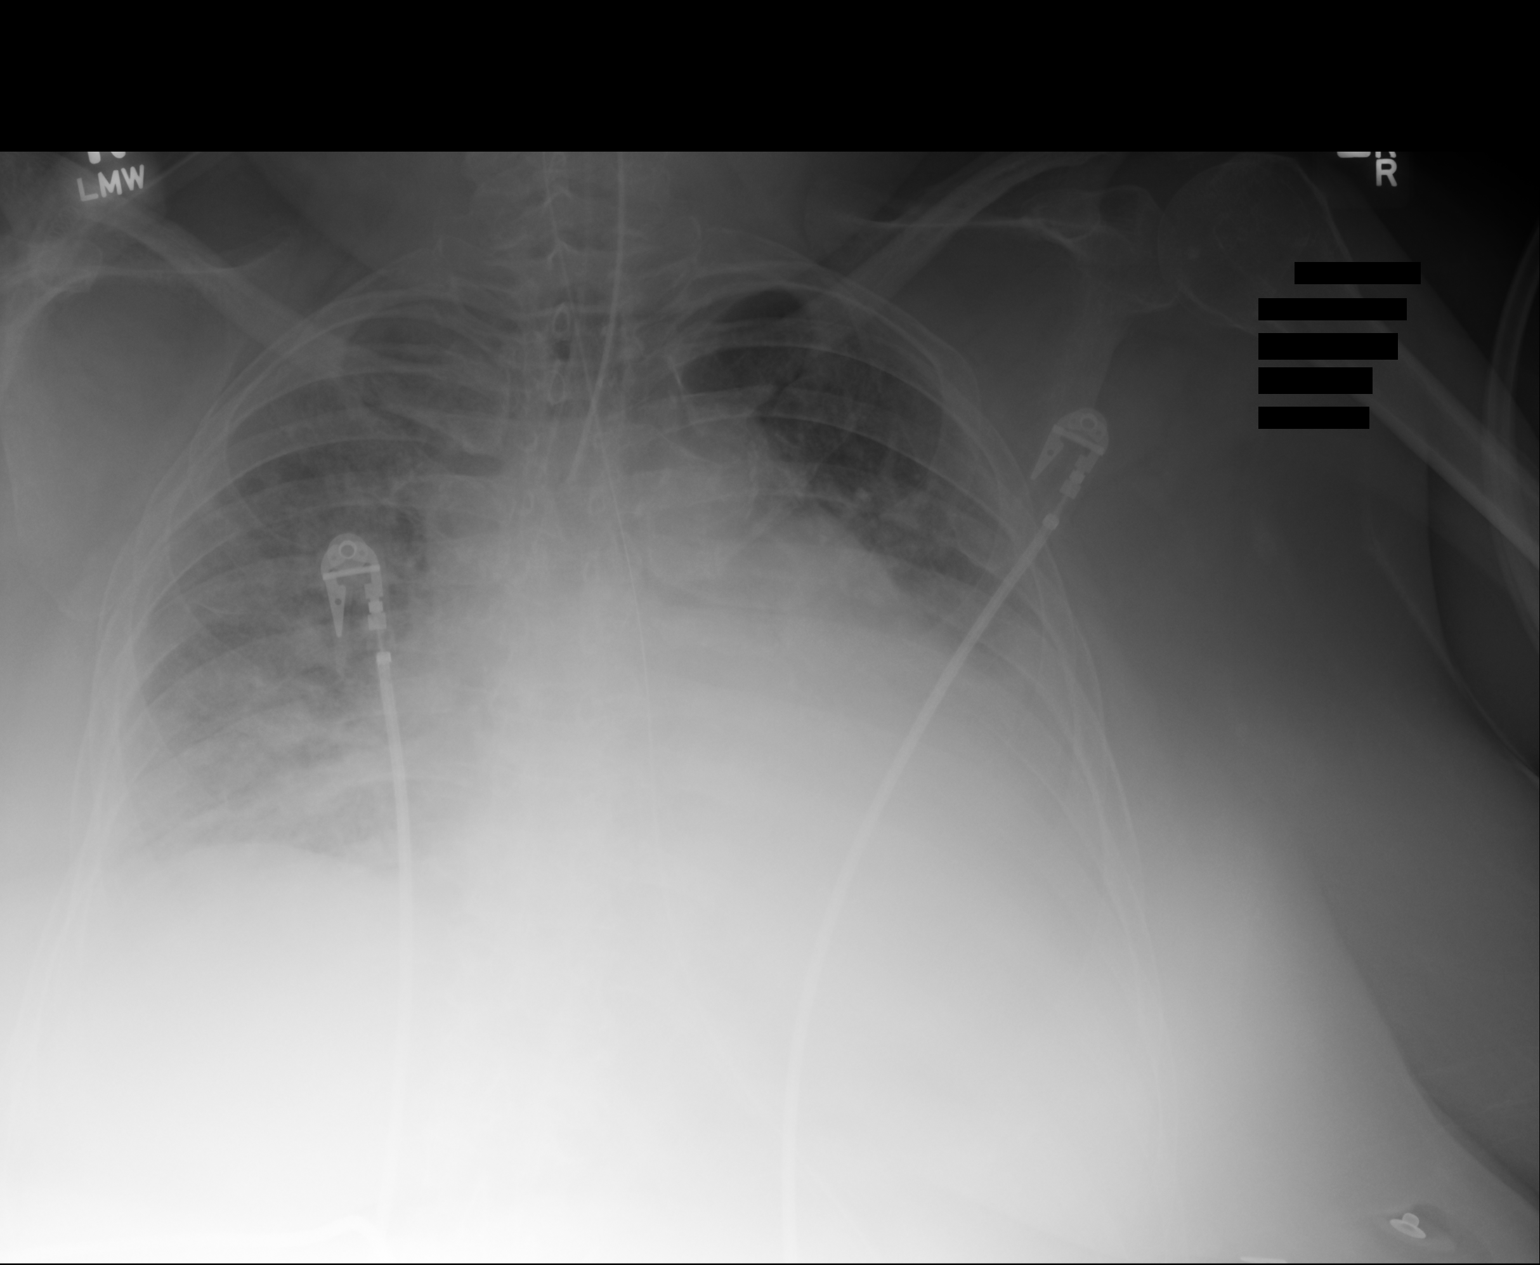

[1 of 1 positions shown; findings below may reference images not displayed]

Canned report from images found in remote index.

Refer to host system for actual result text.

## 2015-04-14 IMAGING — CR DG ABD PORTABLE 1V
1 series · 1 of 1 positions shown · non-contrast
Comparison: 12/10/2012

CLINICAL DATA: NG placement

EXAM:
PORTABLE ABDOMEN - 1 VIEW

[AP]
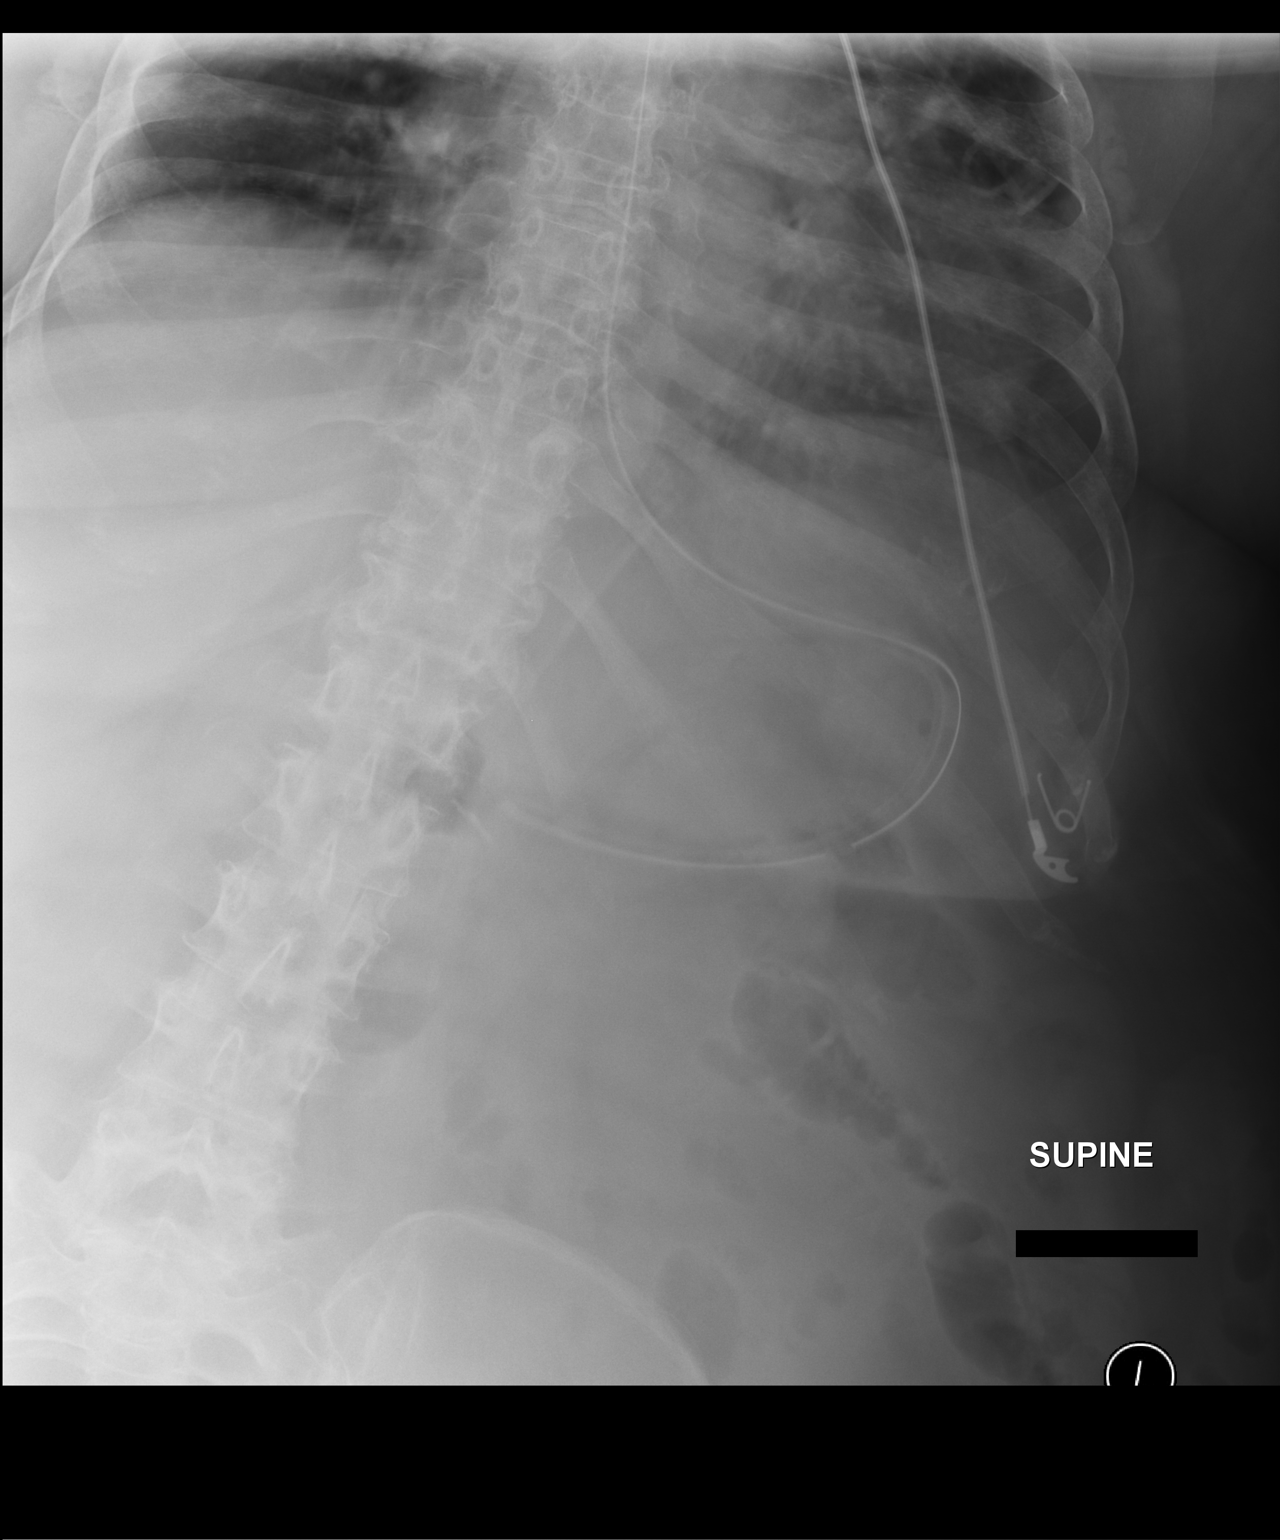

[1 of 1 positions shown; findings below may reference images not displayed]

FINDINGS: A nasogastric catheter is noted within the stomach with the tip
directed towards the pylorus. A nonobstructive bowel gas pattern is
seen. Some changes are noted in the bases of the lungs stable from
the previous exam.

## 2015-04-14 IMAGING — CR DG CHEST 1V PORT
1 series · 1 of 1 positions shown · non-contrast
Comparison: 12/13/2012

CLINICAL DATA: Acute renal failure

EXAM:
PORTABLE CHEST - 1 VIEW

[AP]
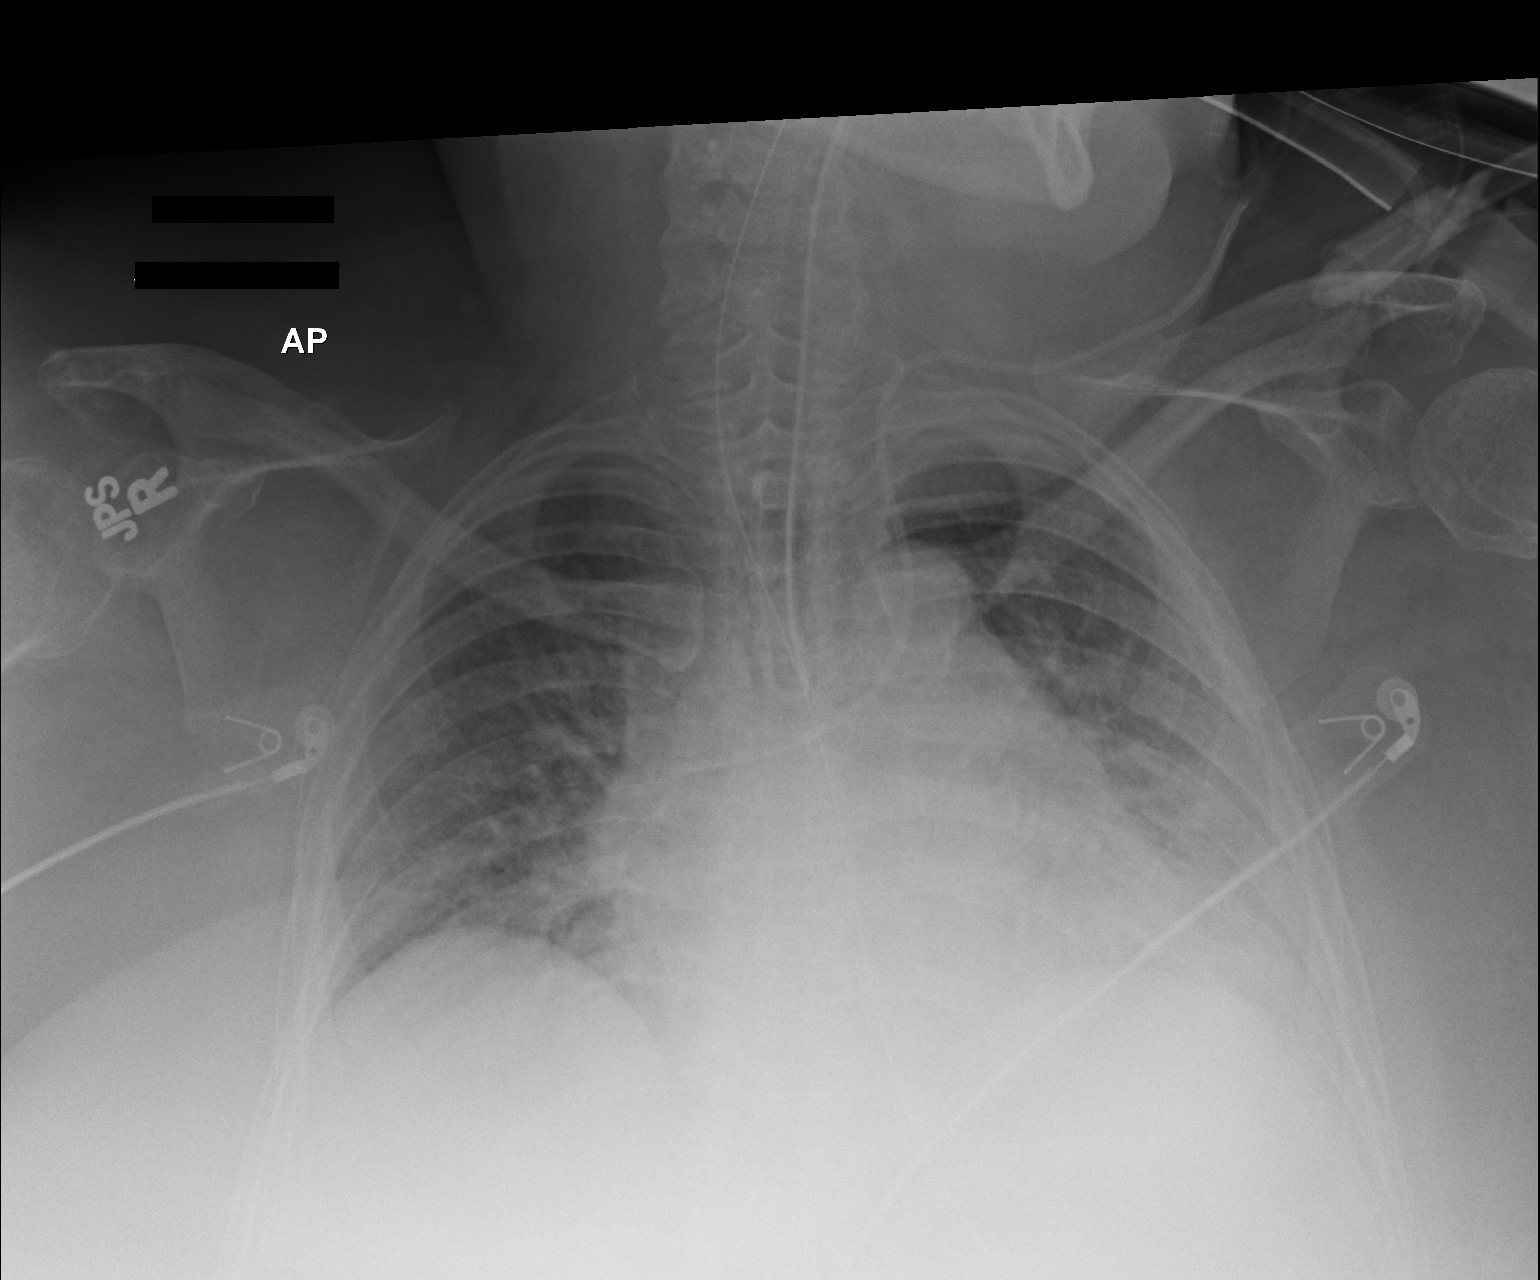

[1 of 1 positions shown; findings below may reference images not displayed]

FINDINGS: Endotracheal tube tip is just above the carinal. Left subclavian
catheter tip is in the projection of the SVC. There is an enteric
tube with tip below the GE junction. The heart size is moderately
enlarged. There are low lung volumes. Pulmonary edema pattern
appears similar to previous exam.
IMPRESSION: 1.  Low lung volumes.

2. Pulmonary edema.
